# Patient Record
Sex: Female | Born: 1990 | Race: Black or African American | Hispanic: No | Marital: Single | Smoking: Never smoker
Health system: Southern US, Community
[De-identification: ages and names within clinical notes are randomized; demographics above are authoritative.]

## PROBLEM LIST (undated history)

## (undated) DIAGNOSIS — K900 Celiac disease: Secondary | ICD-10-CM

## (undated) DIAGNOSIS — F329 Major depressive disorder, single episode, unspecified: Secondary | ICD-10-CM

## (undated) DIAGNOSIS — L0591 Pilonidal cyst without abscess: Secondary | ICD-10-CM

## (undated) DIAGNOSIS — F32A Depression, unspecified: Secondary | ICD-10-CM

## (undated) DIAGNOSIS — E282 Polycystic ovarian syndrome: Secondary | ICD-10-CM

## (undated) DIAGNOSIS — D649 Anemia, unspecified: Secondary | ICD-10-CM

## (undated) DIAGNOSIS — F419 Anxiety disorder, unspecified: Secondary | ICD-10-CM

## (undated) DIAGNOSIS — Z8614 Personal history of Methicillin resistant Staphylococcus aureus infection: Secondary | ICD-10-CM

## (undated) DIAGNOSIS — G43909 Migraine, unspecified, not intractable, without status migrainosus: Secondary | ICD-10-CM

## (undated) HISTORY — DX: Polycystic ovarian syndrome: E28.2

## (undated) HISTORY — DX: Pilonidal cyst without abscess: L05.91

## (undated) HISTORY — DX: Anemia, unspecified: D64.9

## (undated) HISTORY — DX: Migraine, unspecified, not intractable, without status migrainosus: G43.909

## (undated) HISTORY — DX: Celiac disease: K90.0

## (undated) HISTORY — PX: INCISION AND DRAINAGE: SHX5863

---
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
## 2009-04-15 ENCOUNTER — Ambulatory Visit: Payer: Self-pay | Admitting: Family Medicine

## 2009-04-15 DIAGNOSIS — Z87898 Personal history of other specified conditions: Secondary | ICD-10-CM | POA: Insufficient documentation

## 2009-04-15 DIAGNOSIS — K625 Hemorrhage of anus and rectum: Secondary | ICD-10-CM | POA: Insufficient documentation

## 2009-04-15 DIAGNOSIS — J309 Allergic rhinitis, unspecified: Secondary | ICD-10-CM | POA: Insufficient documentation

## 2009-04-15 DIAGNOSIS — E282 Polycystic ovarian syndrome: Secondary | ICD-10-CM | POA: Insufficient documentation

## 2009-10-21 ENCOUNTER — Ambulatory Visit: Payer: Self-pay | Admitting: Family Medicine

## 2009-11-15 ENCOUNTER — Ambulatory Visit: Payer: Self-pay | Admitting: Family Medicine

## 2009-11-15 DIAGNOSIS — R51 Headache: Secondary | ICD-10-CM | POA: Insufficient documentation

## 2009-11-15 DIAGNOSIS — R519 Headache, unspecified: Secondary | ICD-10-CM | POA: Insufficient documentation

## 2009-11-26 ENCOUNTER — Ambulatory Visit: Payer: Self-pay | Admitting: Family Medicine

## 2010-07-02 ENCOUNTER — Ambulatory Visit: Payer: Self-pay | Admitting: Family Medicine

## 2010-07-02 DIAGNOSIS — M79609 Pain in unspecified limb: Secondary | ICD-10-CM | POA: Insufficient documentation

## 2010-07-02 DIAGNOSIS — L678 Other hair color and hair shaft abnormalities: Secondary | ICD-10-CM | POA: Insufficient documentation

## 2010-07-02 DIAGNOSIS — L738 Other specified follicular disorders: Secondary | ICD-10-CM | POA: Insufficient documentation

## 2010-07-04 LAB — CONVERTED CEMR LAB
BUN: 10 mg/dL (ref 6–23)
Basophils Relative: 0.9 % (ref 0.0–3.0)
CO2: 27 meq/L (ref 19–32)
Chloride: 109 meq/L (ref 96–112)
Creatinine, Ser: 0.8 mg/dL (ref 0.4–1.2)
Eosinophils Absolute: 0.1 10*3/uL (ref 0.0–0.7)
HCT: 34.8 % — ABNORMAL LOW (ref 36.0–46.0)
Lymphs Abs: 1.4 10*3/uL (ref 0.7–4.0)
MCHC: 32.4 g/dL (ref 30.0–36.0)
MCV: 76.1 fL — ABNORMAL LOW (ref 78.0–100.0)
Monocytes Absolute: 0.4 10*3/uL (ref 0.1–1.0)
Neutrophils Relative %: 43 % (ref 43.0–77.0)
Platelets: 401 10*3/uL — ABNORMAL HIGH (ref 150.0–400.0)
Potassium: 4.2 meq/L (ref 3.5–5.1)
Sed Rate: 10 mm/hr (ref 0–22)
TSH: 0.91 microintl units/mL (ref 0.35–5.50)

## 2010-08-15 ENCOUNTER — Encounter: Payer: Self-pay | Admitting: Family Medicine

## 2011-01-27 NOTE — Letter (Signed)
Summary: Immunization Form  Immunization Form   Imported By: Lanelle Bal 08/21/2010 08:24:17  _____________________________________________________________________  External Attachment:    Type:   Image     Comment:   External Document
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
## 2011-02-25 ENCOUNTER — Ambulatory Visit (INDEPENDENT_AMBULATORY_CARE_PROVIDER_SITE_OTHER): Payer: BC Managed Care – PPO | Admitting: Family Medicine

## 2011-02-25 ENCOUNTER — Encounter: Payer: Self-pay | Admitting: Family Medicine

## 2011-02-25 DIAGNOSIS — K625 Hemorrhage of anus and rectum: Secondary | ICD-10-CM
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
## 2012-08-02 ENCOUNTER — Encounter: Payer: Self-pay | Admitting: Family Medicine

## 2012-08-03 ENCOUNTER — Encounter: Payer: Self-pay | Admitting: *Deleted

## 2012-08-03 ENCOUNTER — Ambulatory Visit (INDEPENDENT_AMBULATORY_CARE_PROVIDER_SITE_OTHER): Payer: BC Managed Care – PPO | Admitting: Family Medicine

## 2012-08-03 ENCOUNTER — Encounter: Payer: Self-pay | Admitting: Family Medicine

## 2012-08-03 VITALS — BP 118/78 | HR 59 | Temp 98.5°F | Ht 67.25 in | Wt 248.8 lb

## 2012-08-03 DIAGNOSIS — R7309 Other abnormal glucose: Secondary | ICD-10-CM

## 2012-08-03 DIAGNOSIS — R5383 Other fatigue: Secondary | ICD-10-CM

## 2012-08-03 DIAGNOSIS — R5381 Other malaise: Secondary | ICD-10-CM

## 2012-08-03 LAB — CBC WITH DIFFERENTIAL/PLATELET
Basophils Relative: 0.5 % (ref 0.0–3.0)
Eosinophils Relative: 2.2 % (ref 0.0–5.0)
HCT: 38.9 % (ref 36.0–46.0)
Hemoglobin: 12.4 g/dL (ref 12.0–15.0)
Lymphs Abs: 1.4 10*3/uL (ref 0.7–4.0)
MCV: 79.7 fl (ref 78.0–100.0)
Monocytes Absolute: 0.5 10*3/uL (ref 0.1–1.0)
Monocytes Relative: 11.5 % (ref 3.0–12.0)
Neutro Abs: 2.1 10*3/uL (ref 1.4–7.7)
Platelets: 357 10*3/uL (ref 150.0–400.0)
WBC: 4 10*3/uL — ABNORMAL LOW (ref 4.5–10.5)

## 2012-08-03 LAB — GLUCOSE, POCT (MANUAL RESULT ENTRY): POC Glucose: 78 mg/dl (ref 70–99)

## 2012-08-03 LAB — TSH: TSH: 1.42 u[IU]/mL (ref 0.35–5.50)

## 2012-08-03 NOTE — Progress Notes (Signed)
   Nature conservation officer at [facility] [address] [place] [address] Phone: [phone] Fax: [phone]  Date:  08/03/2012   Name:  [name]   DOB:  1991-06-08   MRN:  [id number]  PCP:  Kerby Nora, MD    Chief Complaint: Blood Sugar Problem   History of Present Illness:  [name] is a 21 y.o. very pleasant female patient who presents with the following:  BS 78.  Eats breakfast, lunch, and dinner.  Feels a little sluggish, shaky, and some head hurting.  Has been happening around late afternoon.   Will eat at 9 AM, 1, 6-7 PM    Past Medical History, Surgical History, Social History, Family History, Problem List, Medications, and Allergies have been reviewed and updated if relevant.  No current outpatient prescriptions on file prior to visit.    Review of Systems:  GEN: No acute illnesses, no fevers, chills. GI: No n/v/d, eating normally Pulm: No SOB Interactive and getting along well at home.  Otherwise, ROS is as per the HPI.   Physical Examination: Filed Vitals:   08/03/12 0823  BP: 118/78  Pulse: 59  Temp: 98.5 F (36.9 C)   Filed Vitals:   08/03/12 0823  Height: 5' 7.25" (1.708 m)  Weight: 248 lb 12.8 oz (112.855 kg)   Body mass index is 38.68 kg/(m^2). Ideal Body Weight: Weight in (lb) to have BMI = 25: 160.5    GEN: WDWN, NAD, Non-toxic, Alert & Oriented x 3 HEENT: Atraumatic, Normocephalic.  Ears and Nose: No external deformity. EXTR: No clubbing/cyanosis/edema NEURO: Normal gait.  PSYCH: Normally interactive. Conversant. Not depressed or anxious appearing.  Calm demeanor.    Assessment and Plan:  1. Abnormal blood sugar  Glucose (CBG)  2. Fatigue  CBC with Differential, TSH   Sounds to be classic hypoglycemia, reviewed diet, how to alter, schedule out during day  Check basic cbc and tsh, too  Orders Today:  Orders Placed This Encounter  Procedures  . CBC with Differential  . TSH  . Glucose (CBG)     Medications Today: (Includes new updates added during medication reconciliation) No orders of the defined types were placed in this encounter.     Hannah Beat, MD

## 2013-05-19 ENCOUNTER — Encounter: Payer: Self-pay | Admitting: Family Medicine

## 2013-05-19 ENCOUNTER — Ambulatory Visit (INDEPENDENT_AMBULATORY_CARE_PROVIDER_SITE_OTHER): Payer: BC Managed Care – PPO | Admitting: Family Medicine

## 2013-05-19 VITALS — BP 100/70 | HR 68 | Temp 98.2°F | Wt 251.0 lb

## 2013-05-19 DIAGNOSIS — R42 Dizziness and giddiness: Secondary | ICD-10-CM

## 2013-05-19 LAB — CBC WITH DIFFERENTIAL/PLATELET
Basophils Relative: 0.3 % (ref 0.0–3.0)
Eosinophils Absolute: 0.2 10*3/uL (ref 0.0–0.7)
Hemoglobin: 12.5 g/dL (ref 12.0–15.0)
Lymphocytes Relative: 31.1 % (ref 12.0–46.0)
MCHC: 32.5 g/dL (ref 30.0–36.0)
Monocytes Relative: 8 % (ref 3.0–12.0)
Neutro Abs: 3.6 10*3/uL (ref 1.4–7.7)
Neutrophils Relative %: 57.8 % (ref 43.0–77.0)
RBC: 4.9 Mil/uL (ref 3.87–5.11)
WBC: 6.2 10*3/uL (ref 4.5–10.5)

## 2013-05-19 LAB — COMPREHENSIVE METABOLIC PANEL
AST: 17 U/L (ref 0–37)
Albumin: 3.3 g/dL — ABNORMAL LOW (ref 3.5–5.2)
BUN: 7 mg/dL (ref 6–23)
CO2: 24 mEq/L (ref 19–32)
Calcium: 8.6 mg/dL (ref 8.4–10.5)
Chloride: 109 mEq/L (ref 96–112)
Creatinine, Ser: 0.8 mg/dL (ref 0.4–1.2)
GFR: 114.04 mL/min (ref >60.00)
Glucose, Bld: 82 mg/dL (ref 70–99)
Potassium: 4.1 mEq/L (ref 3.5–5.1)

## 2013-05-19 NOTE — Assessment & Plan Note (Signed)
Most likely vasovagal.. Will eval with labs to rule out anemia, electrolyte issues, diabetes. No clear dehydration  Pt denies pregnancy.

## 2013-05-19 NOTE — Progress Notes (Signed)
  Subjective:    Patient ID: [name], female    DOB: [birth year], 22 y.o.   MRN: [id number]  HPI  22 year old female with history of migraines present following presyncopal episode yesterday   Went to bathroom. Occurred after she changed tampon and urinated. Started feeling nauseous, noted dizziness, lightheaded, sweating...became presyncopal but never fell or hit head . Was dizzy for 15 minutes. She was disoriented following this.   She had been feeling well prior to this spell. She is on her menses... Started 2 days ago, using 2 pads per day. Never heavy.  Has been somewhat irregular lately,  No sex since since October.   No CP, no SOB, no headahce prior but has had one since.  Had eaten regularly that day. No increase in stress, no depression or anxiety.  Has not had a migraine in  Months.  Review of Systems  Constitutional: Negative for fever and fatigue.  HENT: Negative for ear pain.   Eyes: Negative for pain.  Respiratory: Negative for chest tightness and shortness of breath.   Cardiovascular: Negative for chest pain, palpitations and leg swelling.  Gastrointestinal: Negative for abdominal pain.  Genitourinary: Negative for dysuria.       Objective:   Physical Exam  Constitutional: Vital signs are normal. She appears well-developed and well-nourished. She is cooperative.  Non-toxic appearance. She does not appear ill. No distress.  HENT:  Head: Normocephalic.  Right Ear: Hearing, tympanic membrane, external ear and ear canal normal. Tympanic membrane is not erythematous, not retracted and not bulging.  Left Ear: Hearing, tympanic membrane, external ear and ear canal normal. Tympanic membrane is not erythematous, not retracted and not bulging.  Nose: No mucosal edema or rhinorrhea. Right sinus exhibits no maxillary sinus tenderness and no frontal sinus tenderness. Left sinus exhibits no maxillary sinus tenderness and no frontal sinus tenderness.  Mouth/Throat: Uvula is  midline, oropharynx is clear and moist and mucous membranes are normal.  Eyes: Conjunctivae, EOM and lids are normal. Pupils are equal, round, and reactive to light. No foreign bodies found.  Fundoscopic exam:      The right eye shows no papilledema.  Neck: Trachea normal and normal range of motion. Neck supple. Carotid bruit is not present. No mass and no thyromegaly present.  Cardiovascular: Normal rate, regular rhythm, S1 normal, S2 normal, normal heart sounds, intact distal pulses and normal pulses.  Exam reveals no gallop and no friction rub.   No murmur heard. Pulmonary/Chest: Effort normal and breath sounds normal. Not tachypneic. No respiratory distress. She has no decreased breath sounds. She has no wheezes. She has no rhonchi. She has no rales.  Abdominal: Soft. Normal appearance and bowel sounds are normal. There is no tenderness.  Neurological: She is alert. She has normal strength and normal reflexes. No cranial nerve deficit or sensory deficit. She displays a negative Romberg sign. Gait normal. GCS eye subscore is 4. GCS verbal subscore is 5. GCS motor subscore is 6.  Skin: Skin is warm, dry and intact. No rash noted.  Psychiatric: Her speech is normal and behavior is normal. Judgment and thought content normal. Her mood appears not anxious. Cognition and memory are normal. She does not exhibit a depressed mood.          Assessment & Plan:

## 2013-05-19 NOTE — Patient Instructions (Addendum)
Push fluids.  We will call with lab results.

## 2013-06-16 ENCOUNTER — Other Ambulatory Visit (HOSPITAL_COMMUNITY)
Admission: RE | Admit: 2013-06-16 | Discharge: 2013-06-16 | Disposition: A | Discharge status: Home / Self Care | Payer: BC Managed Care – PPO | Source: Ambulatory Visit | Attending: Family Medicine | Admitting: Family Medicine

## 2013-06-16 ENCOUNTER — Ambulatory Visit (INDEPENDENT_AMBULATORY_CARE_PROVIDER_SITE_OTHER): Payer: BC Managed Care – PPO | Admitting: Family Medicine

## 2013-06-16 ENCOUNTER — Encounter: Payer: Self-pay | Admitting: Family Medicine

## 2013-06-16 VITALS — BP 128/64 | HR 82 | Temp 98.5°F | Ht 67.25 in | Wt 249.5 lb

## 2013-06-16 DIAGNOSIS — Z309 Encounter for contraceptive management, unspecified: Secondary | ICD-10-CM

## 2013-06-16 DIAGNOSIS — Z01419 Encounter for gynecological examination (general) (routine) without abnormal findings: Secondary | ICD-10-CM | POA: Insufficient documentation

## 2013-06-16 DIAGNOSIS — Z124 Encounter for screening for malignant neoplasm of cervix: Secondary | ICD-10-CM

## 2013-06-16 DIAGNOSIS — Z113 Encounter for screening for infections with a predominantly sexual mode of transmission: Secondary | ICD-10-CM | POA: Insufficient documentation

## 2013-06-16 DIAGNOSIS — Z13220 Encounter for screening for lipoid disorders: Secondary | ICD-10-CM

## 2013-06-16 DIAGNOSIS — Z3009 Encounter for other general counseling and advice on contraception: Secondary | ICD-10-CM | POA: Insufficient documentation

## 2013-06-16 DIAGNOSIS — Z0000 Encounter for general adult medical examination without abnormal findings: Secondary | ICD-10-CM

## 2013-06-16 LAB — LIPID PANEL
HDL: 58.6 mg/dL (ref >39.00)
Total CHOL/HDL Ratio: 3
Triglycerides: 86 mg/dL (ref 0.0–149.0)
VLDL: 17.2 mg/dL (ref 0.0–40.0)

## 2013-06-16 MED ORDER — NORGESTIMATE-ETH ESTRADIOL 0.25-35 MG-MCG PO TABS: 1.0000 | ORAL_TABLET | Freq: Every day | ORAL | Status: DC

## 2013-06-16 NOTE — Assessment & Plan Note (Signed)
Discussed options.  She chose [facility].  Start sprintec.

## 2013-06-16 NOTE — Patient Instructions (Addendum)
Work on [employer], healthy eating and weight loss.  Stop at lab on your way.
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
## 2013-06-17 LAB — HIV ANTIBODY (ROUTINE TESTING W REFLEX): HIV: REACTIVE

## 2013-06-19 ENCOUNTER — Encounter: Payer: Self-pay | Admitting: *Deleted

## 2013-06-19 LAB — HEPATITIS PANEL, ACUTE
HCV Ab: NEGATIVE
Hep B C IgM: NEGATIVE
Hepatitis B Surface Ag: NEGATIVE

## 2013-06-24 LAB — HIV-1 RNA, QUALITATIVE, TMA: HIV-1 RNA, Qualitative, TMA: NOT DETECTED

## 2013-07-31 ENCOUNTER — Other Ambulatory Visit: Payer: Self-pay | Admitting: *Deleted

## 2013-07-31 MED ORDER — NORGESTIMATE-ETH ESTRADIOL 0.25-35 MG-MCG PO TABS: 1.0000 | ORAL_TABLET | Freq: Every day | ORAL | Status: DC

## 2013-11-02 ENCOUNTER — Other Ambulatory Visit: Payer: Self-pay

## 2014-06-26 ENCOUNTER — Ambulatory Visit (INDEPENDENT_AMBULATORY_CARE_PROVIDER_SITE_OTHER): Payer: BC Managed Care – PPO | Admitting: Family Medicine

## 2014-06-26 ENCOUNTER — Encounter: Payer: Self-pay | Admitting: Family Medicine

## 2014-06-26 VITALS — BP 110/70 | HR 73 | Temp 98.2°F | Ht 67.25 in | Wt 251.2 lb

## 2014-06-26 DIAGNOSIS — E669 Obesity, unspecified: Secondary | ICD-10-CM

## 2014-06-26 DIAGNOSIS — E162 Hypoglycemia, unspecified: Secondary | ICD-10-CM

## 2014-06-26 DIAGNOSIS — R454 Irritability and anger: Secondary | ICD-10-CM | POA: Insufficient documentation

## 2014-06-26 NOTE — Assessment & Plan Note (Signed)
Encouraged to not skip meals. Increase protein and fiber in each meal.

## 2014-06-26 NOTE — Assessment & Plan Note (Signed)
Total visit time 30 minutes, > 50% spent counseling and cordinating patients care. Reviewed diet and recommended changes to increase exercise, eat 3 meals a day with snacks.  Low carb diet but high protein and fiber in each meals. Continue water. Follow up in 1-2 months.

## 2014-06-26 NOTE — Progress Notes (Signed)
Pre visit review using our clinic review tool, if applicable. No additional management support is needed unless otherwise documented below in the visit note. 

## 2014-06-26 NOTE — Progress Notes (Signed)
   Subjective:    Patient ID: [name], female    DOB: 02-Nov-1991, 23 y.o.   MRN: [id number]  HPI  23 year old female presents for new onset mood instability, easy to anger. She is tearful frequently when angry. She is irritable. She gets higher level of anger then she has ever had in past.  Ongoing in last year.  Less motivation.  Has some anhedonia, doesn't write anymore. Denies depression, she is anxious. No issues with insomnia. She has been sleeping later.  No excessive guilt.  No SI, no HI. Interfering at home and work.  No increase in stress, no changes in life. No changes associated with menses. No new meds. No history of mood issues.  She has had trouble losing weight . He weight has been stable in alst few years. She knows she needs to change diet.  She does not have time to work out, but try to be more active in daily routine.  Breakfast: Tuna, mayo Wrap tomato and cheese, water Snack watermelon, water  Dinner: [place] bake (rice, beans cheese) water No bedtime snack.   She frequently skips one meal a day.       Review of Systems  Constitutional: Negative for fever and fatigue.  HENT: Negative for ear pain.   Eyes: Negative for pain.  Respiratory: Negative for chest tightness and shortness of breath.   Cardiovascular: Negative for chest pain, palpitations and leg swelling.  Gastrointestinal: Negative for abdominal pain.  Genitourinary: Negative for dysuria.       Objective:   Physical Exam  Constitutional: Vital signs are normal. She appears well-developed and well-nourished. She is cooperative.  Non-toxic appearance. She does not appear ill. No distress.  Obese   HENT:  Head: Normocephalic.  Right Ear: Hearing, tympanic membrane, external ear and ear canal normal. Tympanic membrane is not erythematous, not retracted and not bulging.  Left Ear: Hearing, tympanic membrane, external ear and ear canal normal. Tympanic membrane is not erythematous,  not retracted and not bulging.  Nose: No mucosal edema or rhinorrhea. Right sinus exhibits no maxillary sinus tenderness and no frontal sinus tenderness. Left sinus exhibits no maxillary sinus tenderness and no frontal sinus tenderness.  Mouth/Throat: Uvula is midline, oropharynx is clear and moist and mucous membranes are normal.  Eyes: Conjunctivae, EOM and lids are normal. Pupils are equal, round, and reactive to light. Lids are everted and swept, no foreign bodies found.  Neck: Trachea normal and normal range of motion. Neck supple. Carotid bruit is not present. No mass and no thyromegaly present.  Cardiovascular: Normal rate, regular rhythm, S1 normal, S2 normal, normal heart sounds, intact distal pulses and normal pulses.  Exam reveals no gallop and no friction rub.   No murmur heard. Pulmonary/Chest: Effort normal and breath sounds normal. Not tachypneic. No respiratory distress. She has no decreased breath sounds. She has no wheezes. She has no rhonchi. She has no rales.  Abdominal: Soft. Normal appearance and bowel sounds are normal. There is no tenderness.  Neurological: She is alert.  Skin: Skin is warm, dry and intact. No rash noted.  Psychiatric: Her speech is normal and behavior is normal. Judgment and thought content normal. Her mood appears not anxious. Cognition and memory are normal. She does not exhibit a depressed mood.          Assessment & Plan:

## 2014-06-26 NOTE — Assessment & Plan Note (Signed)
NO definate major depression.  Will refer for anger management and counseling.  If not improving in follow up 1 month, consider venlafaxine to treat.

## 2014-06-26 NOTE — Patient Instructions (Addendum)
Work on increasing exercise to gradually to 3-5 times a week. Get partner to exercise and work on diet changes with you.  Do not skip meals.  Increase fiber and protein in each meal.  Work on low carb.  Water is great.  Stop at front desk to set up nutritionist and counselor.  Follow up in 1-2 months for weight and mood.

## 2014-07-18 ENCOUNTER — Ambulatory Visit: Payer: BC Managed Care – PPO | Admitting: Psychology

## 2014-08-01 ENCOUNTER — Ambulatory Visit (INDEPENDENT_AMBULATORY_CARE_PROVIDER_SITE_OTHER): Payer: BC Managed Care – PPO | Admitting: Psychology

## 2014-08-01 DIAGNOSIS — F331 Major depressive disorder, recurrent, moderate: Secondary | ICD-10-CM

## 2014-08-07 ENCOUNTER — Encounter: Payer: BC Managed Care – PPO | Admitting: Family Medicine

## 2014-08-07 DIAGNOSIS — Z0289 Encounter for other administrative examinations: Secondary | ICD-10-CM

## 2014-08-15 ENCOUNTER — Ambulatory Visit: Payer: BC Managed Care – PPO | Admitting: Psychology

## 2014-08-29 ENCOUNTER — Ambulatory Visit (INDEPENDENT_AMBULATORY_CARE_PROVIDER_SITE_OTHER): Payer: BC Managed Care – PPO | Admitting: Psychology

## 2014-08-29 DIAGNOSIS — F331 Major depressive disorder, recurrent, moderate: Secondary | ICD-10-CM

## 2014-09-05 ENCOUNTER — Ambulatory Visit: Payer: BC Managed Care – PPO | Admitting: Psychology

## 2015-04-04 ENCOUNTER — Encounter: Payer: Self-pay | Admitting: Family Medicine

## 2015-04-04 ENCOUNTER — Other Ambulatory Visit (HOSPITAL_COMMUNITY)
Admission: RE | Admit: 2015-04-04 | Discharge: 2015-04-04 | Disposition: A | Discharge status: Home / Self Care | Payer: BLUE CROSS/BLUE SHIELD | Source: Ambulatory Visit | Attending: Family Medicine | Admitting: Family Medicine

## 2015-04-04 ENCOUNTER — Ambulatory Visit (INDEPENDENT_AMBULATORY_CARE_PROVIDER_SITE_OTHER): Payer: BLUE CROSS/BLUE SHIELD | Admitting: Family Medicine

## 2015-04-04 VITALS — BP 122/70 | HR 93 | Temp 98.4°F | Ht 67.0 in | Wt 262.0 lb

## 2015-04-04 DIAGNOSIS — Z113 Encounter for screening for infections with a predominantly sexual mode of transmission: Secondary | ICD-10-CM

## 2015-04-04 DIAGNOSIS — Z1151 Encounter for screening for human papillomavirus (HPV): Secondary | ICD-10-CM | POA: Insufficient documentation

## 2015-04-04 DIAGNOSIS — Z01419 Encounter for gynecological examination (general) (routine) without abnormal findings: Secondary | ICD-10-CM | POA: Insufficient documentation

## 2015-04-04 DIAGNOSIS — E669 Obesity, unspecified: Secondary | ICD-10-CM

## 2015-04-04 DIAGNOSIS — Z30018 Encounter for initial prescription of other contraceptives: Secondary | ICD-10-CM

## 2015-04-04 DIAGNOSIS — Z13220 Encounter for screening for lipoid disorders: Secondary | ICD-10-CM

## 2015-04-04 DIAGNOSIS — Z0000 Encounter for general adult medical examination without abnormal findings: Secondary | ICD-10-CM | POA: Diagnosis not present

## 2015-04-04 DIAGNOSIS — Z124 Encounter for screening for malignant neoplasm of cervix: Secondary | ICD-10-CM

## 2015-04-04 MED ORDER — NORELGESTROMIN-ETH ESTRADIOL 150-35 MCG/24HR TD PTWK: 1.0000 | MEDICATED_PATCH | TRANSDERMAL | Status: DC

## 2015-04-04 NOTE — Patient Instructions (Addendum)
Schedule lab appt when able for fasting labs. Working on regular exercise. Continue working on [employer], increasing water, decreasing high fat , carb foods. Work on weight loss as able.

## 2015-04-04 NOTE — Progress Notes (Signed)
Pre visit review using our clinic review tool, if applicable. No additional management support is needed unless otherwise documented below in the visit note. 

## 2015-04-04 NOTE — Progress Notes (Signed)
The patient is here for annual wellness exam and preventative care.   She is doing well overall.  Mood doing well overall. Went to Social worker.  Having panic attacks rarely.   Working third shift, sleepy today.  Menses regular, no dysmenorrhea. Last 2-3 days LMP 03/25/15. Occ current sexual activity, using condoms when active. No current migraines.  Exercise: active ob but not exercise. Diet: poor , working on improving.  sHistory   Social History  . Marital Status: Married    Spouse Name: N/A    Number of Children: N/A  . Years of Education: N/A   Occupational History  . STUDENT    Social History Main Topics  . Smoking status: Never Smoker   . Smokeless tobacco: None  . Alcohol Use: Yes     Comment: OCC  . Drug Use: No  . Sexually Active: Not Currently   Other Topics Concern  . None   Social History Narrative   REGULAR EXERCISE-NO   Healthy eating.   HAS EXPERIENCED PHYSICAL ABUSE   SLEEPS 7-9 HOURS PER NIGHT   5 PEOPLE LIVING IN THE RESIDENCE     Review of Systems  Constitutional: Negative for fever, fatigue and unexpected weight change.  HENT:  Negative for ear pain, sore throat, sneezing, trouble swallowing and sinus pressure.  Eyes: Negative for pain and itching.  Respiratory: Negative for cough, shortness of breath and wheezing.  Cardiovascular: Negative for chest pain, palpitations and leg swelling.  Gastrointestinal: Negative for nausea, abdominal pain, diarrhea, constipation and blood in stool.  Genitourinary: Negative for dysuria, hematuria, vaginal discharge, difficulty urinating and menstrual problem.  Skin: Negative for rash.  Neurological: Negative for syncope, weakness, light-headedness, numbness and headaches.  Psychiatric/Behavioral: Negative for confusion and dysphoric mood. The patient is not nervous/anxious.       Objective:   Physical Exam  Constitutional: Vital  signs are normal. She appears well-developed and well-nourished. She is cooperative. Non-toxic appearance. She does not appear ill. No distress.  obese  HENT:  Head: Normocephalic.  Right Ear: Hearing, tympanic membrane, external ear and ear canal normal.  Left Ear: Hearing, tympanic membrane, external ear and ear canal normal.  Nose: Nose normal.  Eyes: Conjunctivae, EOM and lids are normal. Pupils are equal, round, and reactive to light. No foreign bodies found.  Neck: Trachea normal and normal range of motion. Neck supple. Carotid bruit is not present. No mass and no thyromegaly present.  Cardiovascular: Normal rate, regular rhythm, S1 normal, S2 normal, normal heart sounds and intact distal pulses. Exam reveals no gallop.  No murmur heard. Pulmonary/Chest: Effort normal and breath sounds normal. No respiratory distress. She has no wheezes. She has no rhonchi. She has no rales.  Abdominal: Soft. Normal appearance and bowel sounds are normal. She exhibits no distension, no fluid wave, no abdominal bruit and no mass. There is no hepatosplenomegaly. There is no tenderness. There is no rebound, no guarding and no CVA tenderness. No hernia.  Genitourinary: Vagina normal and uterus normal. No breast swelling, tenderness, discharge or bleeding. Pelvic exam was performed with patient prone. There is no rash, tenderness or lesion on the right labia. There is no rash, tenderness or lesion on the left labia. Uterus is not enlarged and not tender. Cervix exhibits no motion tenderness, no discharge and no friability. Right adnexum displays no mass, no tenderness and no fullness. Left adnexum displays no mass, no tenderness and no fullness.  Lymphadenopathy:   She has no cervical adenopathy.   She has  no axillary adenopathy.  Neurological: She is alert. She has normal strength. No cranial nerve deficit or sensory deficit.  Skin: Skin is warm, dry and intact. No rash noted.  Psychiatric: Her speech is  normal and behavior is normal. Judgment normal. Her mood appears not anxious. Cognition and memory are normal. She does not exhibit a depressed mood.          Assessment & Plan:  The patient's preventative maintenance and recommended screening tests for an annual wellness exam were reviewed in full today. Brought up to date unless services declined.  Counselled on the importance of diet, exercise, and its role in overall health and mortality. The patient's FH and SH was reviewed, including their home life, tobacco status, and drug and alcohol status.   Vaccines: uptodate. Non smoker PAP/DVE:last pap 2014 nml, due this year with DVE then space every 3 years. STD screening:will do.

## 2015-04-08 ENCOUNTER — Other Ambulatory Visit: Payer: Self-pay | Admitting: Family Medicine

## 2015-04-08 ENCOUNTER — Other Ambulatory Visit (INDEPENDENT_AMBULATORY_CARE_PROVIDER_SITE_OTHER): Payer: BLUE CROSS/BLUE SHIELD

## 2015-04-08 DIAGNOSIS — E669 Obesity, unspecified: Secondary | ICD-10-CM

## 2015-04-08 DIAGNOSIS — E282 Polycystic ovarian syndrome: Secondary | ICD-10-CM

## 2015-04-08 LAB — CYTOLOGY - PAP

## 2015-04-08 NOTE — Addendum Note (Signed)
Addended by: Marchia Bond on: 04/08/2015 03:42 PM   Modules accepted: Orders

## 2015-04-09 ENCOUNTER — Encounter: Payer: Self-pay | Admitting: *Deleted

## 2015-04-09 LAB — COMPREHENSIVE METABOLIC PANEL
ALK PHOS: 96 IU/L (ref 39–117)
ALT: 14 IU/L (ref 0–32)
AST: 17 IU/L (ref 0–40)
Albumin/Globulin Ratio: 1.5 (ref 1.1–2.5)
Albumin: 4.3 g/dL (ref 3.5–5.5)
BILIRUBIN TOTAL: 0.3 mg/dL (ref 0.0–1.2)
BUN / CREAT RATIO: 11 (ref 8–20)
BUN: 9 mg/dL (ref 6–20)
CHLORIDE: 102 mmol/L (ref 97–108)
CO2: 25 mmol/L (ref 18–29)
CREATININE: 0.81 mg/dL (ref 0.57–1.00)
Calcium: 9.4 mg/dL (ref 8.7–10.2)
GFR calc Af Amer: 117 mL/min/{1.73_m2} (ref >59)
GFR, EST NON AFRICAN AMERICAN: 101 mL/min/{1.73_m2} (ref >59)
Globulin, Total: 2.9 g/dL (ref 1.5–4.5)
Glucose: 78 mg/dL (ref 65–99)
Potassium: 4.3 mmol/L (ref 3.5–5.2)
Sodium: 141 mmol/L (ref 134–144)
Total Protein: 7.2 g/dL (ref 6.0–8.5)

## 2015-04-09 LAB — LIPID PANEL
CHOL/HDL RATIO: 3.1 ratio (ref 0.0–4.4)
Cholesterol, Total: 180 mg/dL (ref 100–199)
HDL: 59 mg/dL (ref >39)
LDL Calculated: 106 mg/dL — ABNORMAL HIGH (ref 0–99)
TRIGLYCERIDES: 76 mg/dL (ref 0–149)
VLDL CHOLESTEROL CAL: 15 mg/dL (ref 5–40)

## 2015-04-09 LAB — TSH: TSH: 0.72 u[IU]/mL (ref 0.450–4.500)

## 2015-07-16 ENCOUNTER — Encounter: Payer: Self-pay | Admitting: Internal Medicine

## 2015-07-16 ENCOUNTER — Ambulatory Visit (INDEPENDENT_AMBULATORY_CARE_PROVIDER_SITE_OTHER): Payer: BLUE CROSS/BLUE SHIELD | Admitting: Internal Medicine

## 2015-07-16 VITALS — BP 120/80 | HR 77 | Wt 261.0 lb

## 2015-07-16 DIAGNOSIS — S29012A Strain of muscle and tendon of back wall of thorax, initial encounter: Secondary | ICD-10-CM | POA: Diagnosis not present

## 2015-07-16 DIAGNOSIS — S29019A Strain of muscle and tendon of unspecified wall of thorax, initial encounter: Secondary | ICD-10-CM

## 2015-07-16 NOTE — Assessment & Plan Note (Signed)
Seems to be positional relating to how she holds plates to streak them Discussed changing position--sitting/standing, holding plate on table instead of in air when streaking, etc Discussed back strengthening Ibuprofen/tylenol prn

## 2015-07-16 NOTE — Patient Instructions (Signed)
Please look into core strengthening and upper back strengthening.

## 2015-07-16 NOTE — Progress Notes (Signed)
   Subjective:    Patient ID: Nyhla Johnson, female    DOB: 10-06-1991, 24 y.o.   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 norelgestromin-ethinyl estradiol (ORTHO EVRA) 150-35 MCG/24HR transdermal patch Place 1 patch onto the skin once a week. 3 patch 12   No current facility-administered medications on file prior to visit.    No Known Allergies  Past Medical History  Diagnosis Date  . Migraine   . Polycystic ovaries     No past surgical history on file.  Family History  Problem Relation Age of Onset  . Leukemia Mother 22  . Hypertension Father     History   Social History  . Marital Status: Single    Spouse Name: N/A  . Number of Children: 0  . Years of Education: N/A   Occupational History  . Microbiology lab [place]   Social History Main Topics  . Smoking status: Never Smoker   . Smokeless tobacco: Never Used  . Alcohol Use: Yes     Comment: OCC  . Drug Use: No  . Sexual Activity: Not Currently   Other Topics Concern  . Not on file   Social History Narrative   REGULAR EXERCISE-NO   Healthy eating.   HAS EXPERIENCED PHYSICAL ABUSE   SLEEPS 7-9 HOURS PER NIGHT   5 PEOPLE LIVING IN THE RESIDENCE    Review of Systems  No arm or leg weakness Mild increased urinary frequency but no dysuria No trouble with bowels Some stomach  upset     Objective:   Physical Exam  Constitutional: She appears well-developed and well-nourished. No distress.  Neck: Normal range of motion. Neck supple.  Musculoskeletal:  No sig spine tenderness Mostly some tenderness along left lateral back in low thoracic area Normal ROM of arms  Neurological:  No arm weakness          Assessment & Plan:

## 2015-07-16 NOTE — Progress Notes (Signed)
Pre visit review using our clinic review tool, if applicable. No additional management support is needed unless otherwise documented below in the visit note. 

## 2016-03-21 ENCOUNTER — Encounter: Payer: Self-pay | Admitting: Emergency Medicine

## 2016-03-21 ENCOUNTER — Emergency Department
Admission: EM | Admit: 2016-03-21 | Discharge: 2016-03-21 | Disposition: A | Discharge status: Home / Self Care | Payer: 59 | Attending: Emergency Medicine | Admitting: Emergency Medicine

## 2016-03-21 DIAGNOSIS — G43909 Migraine, unspecified, not intractable, without status migrainosus: Secondary | ICD-10-CM | POA: Insufficient documentation

## 2016-03-21 DIAGNOSIS — J329 Chronic sinusitis, unspecified: Secondary | ICD-10-CM

## 2016-03-21 DIAGNOSIS — J111 Influenza due to unidentified influenza virus with other respiratory manifestations: Secondary | ICD-10-CM | POA: Insufficient documentation

## 2016-03-21 DIAGNOSIS — R509 Fever, unspecified: Secondary | ICD-10-CM | POA: Diagnosis present

## 2016-03-21 DIAGNOSIS — E162 Hypoglycemia, unspecified: Secondary | ICD-10-CM | POA: Diagnosis not present

## 2016-03-21 DIAGNOSIS — B9789 Other viral agents as the cause of diseases classified elsewhere: Secondary | ICD-10-CM

## 2016-03-21 LAB — POCT RAPID STREP A: Streptococcus, Group A Screen (Direct): NEGATIVE

## 2016-03-21 LAB — RAPID INFLUENZA A&B ANTIGENS
Influenza A (ARMC): POSITIVE — AB
Influenza B (ARMC): NEGATIVE

## 2016-03-21 MED ORDER — OSELTAMIVIR PHOSPHATE 75 MG PO CAPS: 75.0000 mg | ORAL_CAPSULE | Freq: Two times a day (BID) | ORAL | Status: DC

## 2016-03-21 MED ORDER — FLUTICASONE PROPIONATE 50 MCG/ACT NA SUSP: 2.0000 | Freq: Every day | NASAL | Status: DC

## 2016-03-21 MED ORDER — PSEUDOEPH-BROMPHEN-DM 30-2-10 MG/5ML PO SYRP: 10.0000 mL | ORAL_SOLUTION | Freq: Four times a day (QID) | ORAL | Status: DC | PRN

## 2016-03-21 NOTE — ED Notes (Signed)
Patietn states took ibuprofen prior to arrival, states was sent here because they did not have flu swabs.

## 2016-03-21 NOTE — ED Notes (Signed)
Cough yesterday, fever today.

## 2016-03-21 NOTE — ED Notes (Signed)
Discussed discharge instructions, prescriptions, and follow-up care with patient. No questions or concerns at this time. Pt stable at discharge.  

## 2016-03-21 NOTE — ED Provider Notes (Signed)
[facility] Emergency Department Provider Note  ____________________________________________  Time seen: Approximately 3:12 PM  I have reviewed the triage vital signs and the nursing notes.   HISTORY  Chief Complaint Fever    HPI Masiyah Hilpert is a 25 y.o. female , NAD, presents to the emergency department with 2 day history of sudden onset cough, fever, sinus pressure. States she was seen in a local urgent care earlier today who gave her ibuprofen and told her she had viral respiratory infection. Strep test was completed and was negative but they did not have flu tests and are test patient for flu. Patient notes that she spoke with her stepmother and she advised her to come to this emergency department to have flu testing completed. The patient has had onset of generalized body aches and fever as of yesterday. Has had frontal sinus pressure for 2 days. Has not taken anything over-the-counter to decrease her symptoms. Denies headaches, chest pain, back pain, abdominal pain, nausea, vomiting, diarrhea.   Past Medical History  Diagnosis Date  . Migraine   . Polycystic ovaries     Patient Active Problem List   Diagnosis Date Noted  . Strain of thoracic region 07/16/2015  . Hypoglycemia 06/26/2014  . Irritability and anger 06/26/2014  . Obese 06/26/2014  . Contraceptive management 06/16/2013  . NONSPEC REACT TUBERCULIN SKN TEST W/O ACTV TB 10/21/2009  . POLYCYSTIC OVARIAN DISEASE 04/15/2009  . ALLERGIC RHINITIS 04/15/2009  . MIGRAINES, HX OF 04/15/2009    History reviewed. No pertinent past surgical history.  Current Outpatient Rx  Name  Route  Sig  Dispense  Refill  . brompheniramine-pseudoephedrine-DM 30-2-10 MG/5ML syrup   Oral   Take 10 mLs by mouth 4 (four) times daily as needed.   200 mL   0   . fluticasone (FLONASE) 50 MCG/ACT nasal spray   Each Nare   Place 2 sprays into both nostrils daily.   16 g   0   . norelgestromin-ethinyl  estradiol (ORTHO EVRA) 150-35 MCG/24HR transdermal patch   Transdermal   Place 1 patch onto the skin once a week.   3 patch   12   . oseltamivir (TAMIFLU) 75 MG capsule   Oral   Take 1 capsule (75 mg total) by mouth 2 (two) times daily.   10 capsule   0     Allergies Review of patient's allergies indicates no known allergies.  Family History  Problem Relation Age of Onset  . Leukemia Mother 57  . Hypertension Father     Social History Social History  Substance Use Topics  . Smoking status: Never Smoker   . Smokeless tobacco: Never Used  . Alcohol Use: Yes     Comment: OCC     Review of Systems  Constitutional: Positive fever, chills, sweats. Eyes: No visual changes. No discharge, erythema, swelling ENT: Positive nasal congestion, frontal sinus pressure, sore throat. No ear pain. Cardiovascular: No chest pain. Respiratory: Positive dry cough. No shortness of breath. No wheezing.  Gastrointestinal: No abdominal pain.  No nausea, vomiting.  No diarrhea.  Musculoskeletal: Positive general myalgias. Negative for back, neck pain.  Skin: Negative for rash. Neurological: Negative for headaches, focal weakness or numbness. 10-point ROS otherwise negative.  ____________________________________________   PHYSICAL EXAM:  VITAL SIGNS: ED Triage Vitals  Enc Vitals Group     BP 03/21/16 1411 128/71 mmHg     Pulse Rate 03/21/16 1411 120     Resp 03/21/16 1411 20  Temp 03/21/16 1411 99.9 F (37.7 C)     Temp Source 03/21/16 1411 Oral     SpO2 03/21/16 1411 98 %     Weight 03/21/16 1411 276 lb (125.193 kg)     Height 03/21/16 1411 5\' 7"  (1.702 m)     Head Cir --      Peak Flow --      Pain Score 03/21/16 1413 5     Pain Loc --      Pain Edu? --      Excl. in [place]? --     Constitutional: Alert and oriented. Well appearing and in no acute distress. Eyes: Conjunctivae are normal. PERRL. EOMI without pain.  Head: Atraumatic. ENT:      Ears: TMs visualized  bilaterally with mild bulging but no effusion, erythema, perforation.      Nose: Mild congestion with trace clear rhinnorhea.      Mouth/Throat: Mucous membranes are moist. Pharynx with mild injection but no swelling or exudates. Neck: Supple with full range of motion. Hematological/Lymphatic/Immunilogical: No cervical lymphadenopathy. Cardiovascular: Normal rate, regular rhythm. Normal S1 and S2. No murmurs rubs or gallops. Good peripheral circulation. Respiratory: Normal respiratory effort without tachypnea or retractions. Lungs CTAB with breath sounds noted throughout. Neurologic:  Normal speech and language. No gross focal neurologic deficits are appreciated.  Skin:  Skin is warm, dry and intact. No rash noted. Psychiatric: Mood and affect are normal. Speech and behavior are normal. Patient exhibits appropriate insight and judgement.   ____________________________________________   LABS (all labs ordered are listed, but only abnormal results are displayed)  Labs Reviewed  RAPID INFLUENZA A&B ANTIGENS ([place]) - Abnormal; Notable for the following:    Influenza A ([facility]) POSITIVE (*)    All other components within normal limits  CULTURE, GROUP A STREP [facility])  POCT RAPID STREP A   ____________________________________________  EKG  None ____________________________________________  RADIOLOGY  None ____________________________________________    PROCEDURES  Procedure(s) performed: None    Medications - No data to display   ____________________________________________   INITIAL IMPRESSION / ASSESSMENT AND PLAN / ED COURSE  Pertinent lab results that were available during my care of the patient were reviewed by me and considered in my medical decision making (see chart for details).  Patient's diagnosis is consistent with influenza and viral sinusitis. Patient will be discharged home with prescriptions for Tamiflu, Flonase, Bromfed-DM to take as directed.  Patient may continue over-the-counter Tylenol or ibuprofen as needed for fever and aches. Patient is to follow up with her primary care provider or [facility] if symptoms persist past this treatment course. Patient is given ED precautions to return to the ED for any worsening or new symptoms.    ____________________________________________  FINAL CLINICAL IMPRESSION(S) / ED DIAGNOSES  Final diagnoses:  Influenza  Viral sinusitis      NEW MEDICATIONS STARTED DURING THIS VISIT:  Discharge Medication List as of 03/21/2016  3:29 PM    START taking these medications   Details  brompheniramine-pseudoephedrine-DM 30-2-10 MG/5ML syrup Take 10 mLs by mouth 4 (four) times daily as needed., Starting 03/21/2016, Until Discontinued, Print    fluticasone (FLONASE) 50 MCG/ACT nasal spray Place 2 sprays into both nostrils daily., Starting 03/21/2016, Until Discontinued, Print    oseltamivir (TAMIFLU) 75 MG capsule Take 1 capsule (75 mg total) by mouth 2 (two) times daily., Starting 03/21/2016, Until Discontinued, Print             Braxton Feathers, PA-C 03/21/16  [place], MD 03/21/16 [phone]

## 2016-03-21 NOTE — Discharge Instructions (Signed)

## 2016-03-24 LAB — CULTURE, GROUP A STREP (THRC)

## 2016-04-16 ENCOUNTER — Other Ambulatory Visit: Payer: Self-pay | Admitting: Family Medicine

## 2016-04-16 NOTE — Telephone Encounter (Signed)
Appointment Letter mailed asking patient to call the office and schedule her yearly physical.

## 2016-05-14 ENCOUNTER — Other Ambulatory Visit: Payer: Self-pay | Admitting: Family Medicine

## 2016-05-14 MED ORDER — NORELGESTROMIN-ETH ESTRADIOL 150-35 MCG/24HR TD PTWK: 1.0000 | MEDICATED_PATCH | TRANSDERMAL | Status: DC

## 2016-06-11 ENCOUNTER — Ambulatory Visit (INDEPENDENT_AMBULATORY_CARE_PROVIDER_SITE_OTHER): Payer: 59 | Admitting: Family Medicine

## 2016-06-11 ENCOUNTER — Encounter: Payer: Self-pay | Admitting: Family Medicine

## 2016-06-11 VITALS — BP 112/70 | HR 81 | Temp 98.2°F | Ht 66.5 in | Wt 264.0 lb

## 2016-06-11 DIAGNOSIS — E669 Obesity, unspecified: Secondary | ICD-10-CM | POA: Diagnosis not present

## 2016-06-11 DIAGNOSIS — G8929 Other chronic pain: Secondary | ICD-10-CM | POA: Diagnosis not present

## 2016-06-11 DIAGNOSIS — Z0000 Encounter for general adult medical examination without abnormal findings: Secondary | ICD-10-CM

## 2016-06-11 DIAGNOSIS — M546 Pain in thoracic spine: Secondary | ICD-10-CM | POA: Diagnosis not present

## 2016-06-11 DIAGNOSIS — M549 Dorsalgia, unspecified: Secondary | ICD-10-CM

## 2016-06-11 MED ORDER — DICLOFENAC SODIUM 75 MG PO TBEC: 75.0000 mg | DELAYED_RELEASE_TABLET | Freq: Two times a day (BID) | ORAL | Status: DC

## 2016-06-11 NOTE — Progress Notes (Signed)
Pre visit review using our clinic review tool, if applicable. No additional management support is needed unless otherwise documented below in the visit note. 

## 2016-06-11 NOTE — Patient Instructions (Addendum)
Start home physical therapy at home.  Can try diclofenac twice daily in place of tylenol.. Take on full stomach. Massage, heat on upper back.  Work on Acupuncturist of work Network engineer.  Call if not improving as expected.

## 2016-06-11 NOTE — Assessment & Plan Note (Signed)
NSAID, heat, massage, home PT. Work on [employer] loss.  X-ray at ortho negative.

## 2016-06-11 NOTE — Progress Notes (Signed)
The patient is here for annual wellness exam and preventative care.  ll.   She has been having  Upper back pain in last year. Tylenol not helping with pain. No known injury but she does walk on hard cement floors at work.  She has been wearing posture belt.. No longer helping.  She works in a lab stooped over.  Pain in center between shoulder blades. No radiation of pain.  No numbness or tingling, no weakness in arms.  Tylenol  650 and bayerfor pain.Marland Kitchen Helping minimaly.  Advil, aleve does not work for her. She saw ORTHO for this issue.. Few months ago. Sent PT (helped some, but could not afford it), neg X-ray.  Mood doing well overall. Went to Social worker. Having panic attacks rarely.  Working third shift at lab.  Menses regular, no dysmenorrhen patches for contraception. No current migraines.  Exercise: Hard given 3rd shift, trying to walk more Diet: poor, working on improving. Not eating out, drink more water.   Body mass index is 41.98 kg/(m^2).  Wt Readings from Last 3 Encounters:  06/11/16 264 lb (119.75 kg)  03/21/16 276 lb (125.193 kg)  07/16/15 261 lb (118.389 kg)   BP Readings from Last 3 Encounters:  06/11/16 112/70  03/21/16 128/71  07/16/15 120/80     History   Social History  . Marital Status: Married    Spouse Name: N/A    Number of Children: N/A  . Years of Education: N/A   Occupational History  . STUDENT    Social History Main Topics  . Smoking status: Never Smoker   . Smokeless tobacco: None  . Alcohol Use: Yes     Comment: OCC  . Drug Use: No  . Sexually Active: Not Currently   Other Topics Concern  . None   Social History Narrative   REGULAR EXERCISE-NO   Healthy eating.   HAS EXPERIENCED PHYSICAL ABUSE   SLEEPS 7-9 HOURS PER NIGHT   5 PEOPLE LIVING IN THE RESIDENCE     Review of Systems  Constitutional:  Negative for fever, fatigue and unexpected weight change.  HENT: Negative for ear pain, sore throat, sneezing, trouble swallowing and sinus pressure.  Eyes: Negative for pain and itching.  Respiratory: Negative for cough, shortness of breath and wheezing.  Cardiovascular: Negative for chest pain, palpitations and leg swelling.  Gastrointestinal: Negative for nausea, abdominal pain, diarrhea, constipation and blood in stool.  Genitourinary: Negative for dysuria, hematuria, vaginal discharge, difficulty urinating and menstrual problem.  Skin: Negative for rash.  Neurological: Negative for syncope, weakness, light-headedness, numbness and headaches.  Psychiatric/Behavioral: Negative for confusion and dysphoric mood. The patient is not nervous/anxious.      Objective:  Physical Exam  Constitutional: Vital signs are normal. She appears well-developed and well-nourished. She is cooperative. Non-toxic appearance. She does not appear ill. No distress.  obese  HENT:  Head: Normocephalic.  Right Ear: Hearing, tympanic membrane, external ear and ear canal normal.  Left Ear: Hearing, tympanic membrane, external ear and ear canal normal.  Nose: Nose normal.  Eyes: Conjunctivae, EOM and lids are normal. Pupils are equal, round, and reactive to light. No foreign bodies found.  Neck: Trachea normal and normal range of motion. Neck supple. Carotid bruit is not present. No mass and no thyromegaly present.  Cardiovascular: Normal rate, regular rhythm, S1 normal, S2 normal, normal heart sounds and intact distal pulses. Exam reveals no gallop.  No murmur heard. Pulmonary/Chest: Effort normal and breath sounds normal. No respiratory distress. She  has no wheezes. She has no rhonchi. She has no rales.  Abdominal: Soft. Normal appearance and bowel sounds are normal. She exhibits no distension, no fluid wave, no abdominal bruit and no mass. There is no hepatosplenomegaly. There is no  tenderness. There is no rebound, no guarding and no CVA tenderness. No hernia.  Genitourinary: Vagina normal and uterus normal. No breast swelling, tenderness, discharge or bleeding. Pelvic exam was performed with patient supine. There is no rash, tenderness or lesion on the right labia. There is no rash, tenderness or lesion on the left labia. Uterus is not enlarged and not tender.. Right adnexum displays no mass, no tenderness and no fullness. Left adnexum displays no mass, no tenderness and no fullness.  Lymphadenopathy:   She has no cervical adenopathy.   She has no axillary adenopathy.  Neurological: She is alert. She has normal strength. No cranial nerve deficit or sensory deficit.  Skin: Skin is warm, dry and intact. No rash noted.  Psychiatric: Her speech is normal and behavior is normal. Judgment normal. Her mood appears not anxious. Cognition and memory are normal. She does not exhibit a depressed mood.         Assessment & Plan:  The patient's preventative maintenance and recommended screening tests for an annual wellness exam were reviewed in full today. Brought up to date unless services declined.  Counselled on the importance of diet, exercise, and its role in overall health and mortality. The patient's FH and SH was reviewed, including their home life, tobacco status, and drug and alcohol status.   Vaccines: uptodate. Non smoker PAP/DVE:last pap 2016 nml and no HPV, repeat in 3 years. Needs yearly DVE. STD screening: Denies need

## 2016-06-15 ENCOUNTER — Other Ambulatory Visit: Payer: Self-pay | Admitting: Family Medicine

## 2016-10-20 ENCOUNTER — Encounter: Payer: Self-pay | Admitting: Family Medicine

## 2016-10-20 ENCOUNTER — Ambulatory Visit (INDEPENDENT_AMBULATORY_CARE_PROVIDER_SITE_OTHER): Payer: 59 | Admitting: Family Medicine

## 2016-10-20 VITALS — BP 108/60 | HR 94 | Temp 98.3°F | Wt 274.0 lb

## 2016-10-20 DIAGNOSIS — Z23 Encounter for immunization: Secondary | ICD-10-CM

## 2016-10-20 DIAGNOSIS — M549 Dorsalgia, unspecified: Secondary | ICD-10-CM

## 2016-10-20 DIAGNOSIS — G8929 Other chronic pain: Secondary | ICD-10-CM

## 2016-10-20 MED ORDER — CYCLOBENZAPRINE HCL 5 MG PO TABS: 5.0000 mg | ORAL_TABLET | Freq: Every day | ORAL | 0 refills | Status: DC

## 2016-10-20 NOTE — Patient Instructions (Signed)
It was nice to meet you. Please take flexeril at bedtime for the next 2 weeks.  Call us with an update.

## 2016-10-20 NOTE — Progress Notes (Signed)
Pre visit review using our clinic review tool, if applicable. No additional management support is needed unless otherwise documented below in the visit note. 

## 2016-10-20 NOTE — Progress Notes (Signed)
Subjective:   Patient ID: [name], female    DOB: [birth year], 25 y.o.   MRN: [id number]  [name] is a pleasant 25 y.o. year old female patient of Dr. Diona Browner, new to me, who presents to clinic today with Back Pain (denies any urinary s/s) and Numbness (hands bilateral)  on 10/20/2016  HPI:  Back pain-  Chart reviewed.  Has been evaluated for chronic upper back pain by ortho and PT.  PT helped but she could not afford to continue. Xrays were neg.  Pain is in the center of her shoulder bladed.  Works in a lab stooped over.  Taking Tylenol and Bayer for pain.  Does not help much. Diclofenac upset her stomach.  Now hands are starting to tingle bilaterally which is new.  Posture belt helps sometimes.  Current Outpatient Prescriptions on File Prior to Visit  Medication Sig Dispense Refill  . diclofenac (VOLTAREN) 75 MG EC tablet Take 1 tablet (75 mg total) by mouth 2 (two) times daily. 30 tablet 0  . XULANE 150-35 MCG/24HR transdermal patch PLACE 1 PATCH ONTO THE SKIN ONCE A WEEK. 3 patch 11   No current facility-administered medications on file prior to visit.     No Known Allergies  Past Medical History:  Diagnosis Date  . Migraine   . Polycystic ovaries     No past surgical history on file.  Family History  Problem Relation Age of Onset  . Leukemia Mother 40  . Hypertension Father     Social History   Social History  . Marital status: Single    Spouse name: N/A  . Number of children: 0  . Years of education: N/A   Occupational History  . Microbiology lab [employer]   Social History Main Topics  . Smoking status: Never Smoker  . Smokeless tobacco: Never Used  . Alcohol use Yes     Comment: OCC  . Drug use: No  . Sexual activity: Not Currently   Other Topics Concern  . Not on file   Social History Narrative   REGULAR EXERCISE-NO   Healthy eating.   HAS EXPERIENCED PHYSICAL ABUSE   SLEEPS 7-9 HOURS PER NIGHT   5 PEOPLE LIVING IN THE  RESIDENCE   The PMH, PSH, Social History, Family History, Medications, and allergies have been reviewed in [facility], and have been updated if relevant.     Review of Systems  Constitutional: Negative.   Musculoskeletal: Positive for back pain, neck pain and neck stiffness.  Neurological: Positive for numbness. Negative for tremors, facial asymmetry, weakness, light-headedness and headaches.  All other systems reviewed and are negative.      Objective:    BP 108/60   Pulse 94   Temp 98.3 F (36.8 C) (Oral)   Wt 274 lb (124.3 kg)   LMP 09/27/2016   SpO2 98%   BMI 43.56 kg/m    Physical Exam  Constitutional: She appears well-developed and well-nourished. No distress.  HENT:  Head: Normocephalic.  Eyes: Conjunctivae are normal.  Cardiovascular: Normal rate.   Pulmonary/Chest: Effort normal.  Musculoskeletal:       Cervical back: She exhibits spasm. She exhibits normal range of motion, no tenderness, no bony tenderness and no pain.       Back:  Skin: Skin is warm and dry. She is not diaphoretic.  Psychiatric: She has a normal mood and affect. Her behavior is normal. Judgment and thought content normal.  Nursing note and vitals reviewed.  Assessment & Plan:   Chronic upper back pain  Need for influenza vaccination - Plan: Flu Vaccine QUAD 36+ mos PF IM (Fluarix & Fluzone Quad PF) No Follow-up on file.

## 2016-10-20 NOTE — Assessment & Plan Note (Signed)
Chronic issue. Acutely deteriorated.  eRx sent for flexeril 5 mg qhs. She does not want to return to PT. Advised with new numbness, she may need MRI of cervical spine if symptoms persist. The patient indicates understanding of these issues and agrees with the plan.

## 2016-12-07 ENCOUNTER — Other Ambulatory Visit: Payer: Self-pay | Admitting: Family Medicine

## 2016-12-08 NOTE — Telephone Encounter (Signed)
Last office visit 10/20/2016 with Dr. Deborra Medina.  Last refilled 10/20/2016 for #30 with no refills.  Ok to refill?

## 2017-01-26 ENCOUNTER — Other Ambulatory Visit: Payer: Self-pay | Admitting: Family Medicine

## 2017-01-26 NOTE — Telephone Encounter (Signed)
Last office visit 10/20/2016 with Dr. Deborra Medina.  Last refilled 12/08/2016 for #30 with no refills.  Ok to refill?

## 2017-05-04 ENCOUNTER — Other Ambulatory Visit: Payer: Self-pay | Admitting: Family Medicine

## 2017-05-04 MED ORDER — CYCLOBENZAPRINE HCL 5 MG PO TABS: 5.0000 mg | ORAL_TABLET | Freq: Every day | ORAL | 0 refills | Status: DC

## 2017-05-04 NOTE — Telephone Encounter (Signed)
Duplicate request

## 2017-05-04 NOTE — Telephone Encounter (Signed)
Last office visit 09/30/2016 with Dr. Deborra Medina.  Last refilled 01/26/2017 for #30 with no refills.  Ok to refill?

## 2017-06-09 ENCOUNTER — Other Ambulatory Visit: Payer: Self-pay | Admitting: Family Medicine

## 2017-06-25 ENCOUNTER — Encounter: Payer: Self-pay | Admitting: Family Medicine

## 2017-06-25 ENCOUNTER — Ambulatory Visit (INDEPENDENT_AMBULATORY_CARE_PROVIDER_SITE_OTHER): Payer: 59 | Admitting: Family Medicine

## 2017-06-25 DIAGNOSIS — M549 Dorsalgia, unspecified: Secondary | ICD-10-CM

## 2017-06-25 DIAGNOSIS — G8929 Other chronic pain: Secondary | ICD-10-CM

## 2017-06-25 NOTE — Progress Notes (Signed)
Subjective:    Patient ID: [name], female    DOB: 16-Sep-1991, 26 y.o.   MRN: [id number]  HPI   26 year old female with hx of PCOS pt presents to discuss weight loss options as well as chronic upper back pain.   1. Morbid obesity:  Inconsistent exercise. Trying to decrease eating out/fast foods. Increasing water in diet. Loves sugar and carbs. She skips meals then eats larger meals   She works third shift. Working 10-12 hours.  Body mass index is 44.68 kg/m.    Wt Readings from Last 3 Encounters:  06/25/17 281 lb (127.5 kg)  10/20/16 274 lb (124.3 kg)  06/11/16 264 lb (119.7 kg)   2. Chronic  Upper back pain:  Ongoing x 2 years. No initial injury or fall.  Stands on cement floor at work, repetitive motion at work at [employer]. In shoulder blade area.  No numbness or tingling or weakness in arms or legs currently. Did having tingling in hands and weakness few months ago.Marland Kitchen Resolved now.  She has been using a posture belt that helps with the pain some.  She is trying to exercise: go to aerobics classes, but irregularly. Using tylenol occ for pain. No heat or ice.    Hx of migraines  no recent labs.   Blood pressure 126/74, pulse 95, temperature 98.1 F (36.7 C), temperature source Oral, weight 281 lb (127.5 kg), SpO2 98 %.   Review of Systems  Constitutional: Negative for fatigue and fever.  HENT: Negative for congestion.   Eyes: Negative for pain.  Respiratory: Negative for cough and shortness of breath.   Cardiovascular: Negative for chest pain, palpitations and leg swelling.  Gastrointestinal: Negative for abdominal pain.  Genitourinary: Negative for dysuria and vaginal bleeding.  Musculoskeletal: Positive for back pain.  Neurological: Negative for syncope, light-headedness and headaches.  Psychiatric/Behavioral: Negative for dysphoric mood.       Objective:   Physical Exam  Constitutional: Vital signs are normal. She appears well-developed and  well-nourished. She is cooperative.  Non-toxic appearance. She does not appear ill. No distress.  obese  HENT:  Head: Normocephalic.  Right Ear: Hearing, tympanic membrane, external ear and ear canal normal. Tympanic membrane is not erythematous, not retracted and not bulging.  Left Ear: Hearing, tympanic membrane, external ear and ear canal normal. Tympanic membrane is not erythematous, not retracted and not bulging.  Nose: No mucosal edema or rhinorrhea. Right sinus exhibits no maxillary sinus tenderness and no frontal sinus tenderness. Left sinus exhibits no maxillary sinus tenderness and no frontal sinus tenderness.  Mouth/Throat: Uvula is midline, oropharynx is clear and moist and mucous membranes are normal.  Eyes: Pupils are equal, round, and reactive to light. Conjunctivae, EOM and lids are normal. Lids are everted and swept, no foreign bodies found.  Neck: Trachea normal and normal range of motion. Neck supple. Carotid bruit is not present. No thyroid mass and no thyromegaly present.  Cardiovascular: Normal rate, regular rhythm, S1 normal, S2 normal, normal heart sounds, intact distal pulses and normal pulses.  Exam reveals no gallop and no friction rub.   No murmur heard. Pulmonary/Chest: Effort normal and breath sounds normal. No tachypnea. No respiratory distress. She has no decreased breath sounds. She has no wheezes. She has no rhonchi. She has no rales.  Abdominal: Soft. Normal appearance and bowel sounds are normal. There is no tenderness.  Musculoskeletal:       Cervical back: She exhibits tenderness. She exhibits normal range of  motion and no bony tenderness.       Thoracic back: She exhibits tenderness. She exhibits normal range of motion and no bony tenderness.       Lumbar back: She exhibits normal range of motion, no tenderness and no bony tenderness.  Neurological: She is alert.  Skin: Skin is warm, dry and intact. No rash noted.  Psychiatric: Her speech is normal and  behavior is normal. Judgment and thought content normal. Her mood appears not anxious. Cognition and memory are normal. She does not exhibit a depressed mood.          Assessment & Plan:

## 2017-06-25 NOTE — Patient Instructions (Addendum)
Work on not skipping meal, no fast foods, decrease eating out, bring meals work.  Keep up with water.  Goal trying to exercise 3 days a week.  Start home physical therapy for upper back pain.   Use tylenol for pain.  Use heat on upper back.

## 2017-06-28 ENCOUNTER — Other Ambulatory Visit: Payer: 59

## 2017-06-29 ENCOUNTER — Other Ambulatory Visit (INDEPENDENT_AMBULATORY_CARE_PROVIDER_SITE_OTHER): Payer: 59

## 2017-06-29 LAB — COMPREHENSIVE METABOLIC PANEL
ALBUMIN: 4.1 g/dL (ref 3.5–5.2)
ALK PHOS: 77 U/L (ref 39–117)
ALT: 13 U/L (ref 0–35)
AST: 17 U/L (ref 0–37)
BUN: 12 mg/dL (ref 6–23)
CALCIUM: 9.3 mg/dL (ref 8.4–10.5)
CO2: 28 mEq/L (ref 19–32)
CREATININE: 0.95 mg/dL (ref 0.40–1.20)
Chloride: 104 mEq/L (ref 96–112)
GFR: 90.49 mL/min (ref >60.00)
Glucose, Bld: 87 mg/dL (ref 70–99)
Potassium: 3.7 mEq/L (ref 3.5–5.1)
SODIUM: 139 meq/L (ref 135–145)
TOTAL PROTEIN: 7.3 g/dL (ref 6.0–8.3)
Total Bilirubin: 0.2 mg/dL (ref 0.2–1.2)

## 2017-06-29 LAB — LIPID PANEL
CHOLESTEROL: 146 mg/dL (ref 0–200)
HDL: 57.8 mg/dL (ref >39.00)
LDL Cholesterol: 65 mg/dL (ref 0–99)
NonHDL: 87.84
TRIGLYCERIDES: 112 mg/dL (ref 0.0–149.0)
Total CHOL/HDL Ratio: 3
VLDL: 22.4 mg/dL (ref 0.0–40.0)

## 2017-06-29 LAB — T4, FREE: Free T4: 0.78 ng/dL (ref 0.60–1.60)

## 2017-06-29 LAB — T3, FREE: T3, Free: 4.1 pg/mL (ref 2.3–4.2)

## 2017-06-29 LAB — HEMOGLOBIN A1C: HEMOGLOBIN A1C: 5.9 % (ref 4.6–6.5)

## 2017-06-29 LAB — TSH: TSH: 2.77 u[IU]/mL (ref 0.35–4.50)

## 2017-07-08 NOTE — Assessment & Plan Note (Signed)
Start home physical therapy for upper back pain.   Use tylenol for pain.  Use heat on upper back.

## 2017-07-08 NOTE — Assessment & Plan Note (Signed)
Counseled in detail on diet and exercise chnges to facilitate weight loss. Discussed medications and SE.  Close follow up for update, weight check and encouragemnet in 4 weeks at [facility].

## 2017-07-09 ENCOUNTER — Other Ambulatory Visit: Payer: Self-pay | Admitting: Family Medicine

## 2017-07-23 ENCOUNTER — Ambulatory Visit (INDEPENDENT_AMBULATORY_CARE_PROVIDER_SITE_OTHER): Payer: 59 | Admitting: Family Medicine

## 2017-07-23 ENCOUNTER — Encounter: Payer: Self-pay | Admitting: Family Medicine

## 2017-07-23 VITALS — BP 114/80 | HR 102 | Temp 98.4°F | Ht 66.5 in | Wt 278.2 lb

## 2017-07-23 DIAGNOSIS — Z0000 Encounter for general adult medical examination without abnormal findings: Secondary | ICD-10-CM

## 2017-07-23 DIAGNOSIS — F321 Major depressive disorder, single episode, moderate: Secondary | ICD-10-CM | POA: Diagnosis not present

## 2017-07-23 MED ORDER — VENLAFAXINE HCL ER 37.5 MG PO CP24: ORAL_CAPSULE | ORAL | 1 refills | Status: DC

## 2017-07-23 NOTE — Progress Notes (Signed)
Subjective:    Patient ID: [name], female    DOB: [birth year], 26 y.o.   MRN: [id number]  HPI   The patient is here for annual wellness exam and preventative care.   Doing well overall.   She has been having worsening of mood in last 9-12 months.  Was treating with seeing a counselor.  Has felt a little better in last 2 weeks but PHQ13  Has anhedonia, feels empty.  Decreased motivation. Was not caring for herself. Not bathing or washing clothes.. 2 weeks ago had friends help her.  She has been isolating herself.  She is anxious and overwhlemed at times... Better off caffeine.  She works third shift and is going to second.  Sleep is moderate. She has never used med to treat in past.  Reviewed labs in detail. CMET nml, Cholesterol improving  Morbid Obesity Wt Readings from Last 3 Encounters:  07/23/17 278 lb 4 oz (126.2 kg)  06/25/17 281 lb (127.5 kg)  10/20/16 274 lb (124.3 kg)  Body mass index is 44.24 kg/m.  Menses regular on xulane.   Exercise: working on trying to move it into her schedule,  1-2 times a week, increasing stairs  Diet:healthy diet, cooking more  Social History /Family History/Past Medical History reviewed in detail and updated in EMR if needed. Blood pressure 114/80, pulse (!) 102, temperature 98.4 F (36.9 C), temperature source Oral, height 5' 6.5" (1.689 m), weight 278 lb 4 oz (126.2 kg), last menstrual period 07/09/2017.  Review of Systems  Constitutional: Negative for fatigue and fever.  HENT: Negative for congestion.   Eyes: Negative for pain.  Respiratory: Negative for cough and shortness of breath.   Cardiovascular: Negative for chest pain, palpitations and leg swelling.  Gastrointestinal: Positive for abdominal distention. Negative for abdominal pain.        Seems to be sensitive to gluten and dairy.  Genitourinary: Negative for dysuria and vaginal bleeding.  Musculoskeletal: Negative for back pain.  Neurological: Negative for  syncope, light-headedness and headaches.  Psychiatric/Behavioral: Negative for dysphoric mood.       Objective:   Physical Exam  Constitutional: Vital signs are normal. She appears well-developed and well-nourished. She is cooperative.  Non-toxic appearance. She does not appear ill. No distress.  HENT:  Head: Normocephalic.  Right Ear: Hearing, tympanic membrane, external ear and ear canal normal.  Left Ear: Hearing, tympanic membrane, external ear and ear canal normal.  Nose: Nose normal.  Eyes: Pupils are equal, round, and reactive to light. Conjunctivae, EOM and lids are normal. Lids are everted and swept, no foreign bodies found.  Neck: Trachea normal and normal range of motion. Neck supple. Carotid bruit is not present. No thyroid mass and no thyromegaly present.  Cardiovascular: Normal rate, regular rhythm, S1 normal, S2 normal, normal heart sounds and intact distal pulses.  Exam reveals no gallop.   No murmur heard. Pulmonary/Chest: Effort normal and breath sounds normal. No respiratory distress. She has no wheezes. She has no rhonchi. She has no rales.  Abdominal: Soft. Normal appearance and bowel sounds are normal. She exhibits no distension, no fluid wave, no abdominal bruit and no mass. There is no hepatosplenomegaly. There is no tenderness. There is no rebound, no guarding and no CVA tenderness. No hernia.  Lymphadenopathy:    She has no cervical adenopathy.    She has no axillary adenopathy.  Neurological: She is alert. She has normal strength. No cranial nerve deficit or sensory deficit.  Skin: Skin is  warm, dry and intact. No rash noted.  Psychiatric: Her speech is normal and behavior is normal. Judgment normal. Her mood appears not anxious. Cognition and memory are normal. She does not exhibit a depressed mood.          Assessment & Plan:  The patient's preventative maintenance and recommended screening tests for an annual wellness exam were reviewed in full  today. Brought up to date unless services declined.  Counselled on the importance of diet, exercise, and its role in overall health and mortality. The patient's FH and SH was reviewed, including their home life, tobacco status, and drug and alcohol status.   Vaccines: uptodate. Non smoker PAP/DVE:last pap 2016 nml and no HPV, repeat in 3 years.  STD screening: Denies need

## 2017-07-23 NOTE — Patient Instructions (Addendum)
Start venlafaxine at bedtime, increase after 1 week if tolerating.  Work on increasing exercise and low carbohydrate diet.

## 2017-07-23 NOTE — Assessment & Plan Note (Signed)
Encouraged exercise, weight loss, healthy eating habits. ? ?

## 2017-07-23 NOTE — Assessment & Plan Note (Signed)
Start Effexor. Continue counseling.  Follow up in 4 weeks.

## 2017-08-09 ENCOUNTER — Other Ambulatory Visit: Payer: Self-pay | Admitting: Family Medicine

## 2017-08-25 ENCOUNTER — Other Ambulatory Visit: Payer: Self-pay | Admitting: Family Medicine

## 2017-08-25 MED ORDER — CYCLOBENZAPRINE HCL 5 MG PO TABS: 5.0000 mg | ORAL_TABLET | Freq: Every day | ORAL | 0 refills | Status: DC

## 2017-08-25 MED ORDER — NORELGESTROMIN-ETH ESTRADIOL 150-35 MCG/24HR TD PTWK: 1.0000 | MEDICATED_PATCH | TRANSDERMAL | 11 refills | Status: DC

## 2017-08-25 NOTE — Telephone Encounter (Signed)
Last office visit 07/23/2017.  Last refilled 05/04/2017 for #30 with no refills.  Ok to refill?

## 2017-08-31 ENCOUNTER — Encounter: Payer: Self-pay | Admitting: Family Medicine

## 2017-08-31 ENCOUNTER — Ambulatory Visit (INDEPENDENT_AMBULATORY_CARE_PROVIDER_SITE_OTHER): Payer: 59 | Admitting: Family Medicine

## 2017-08-31 VITALS — BP 120/92 | HR 81 | Temp 98.6°F | Ht 66.5 in | Wt 273.8 lb

## 2017-08-31 DIAGNOSIS — F321 Major depressive disorder, single episode, moderate: Secondary | ICD-10-CM | POA: Diagnosis not present

## 2017-08-31 NOTE — Assessment & Plan Note (Signed)
Continue current dose of venlafaxine.   Start regular exercise, give more time to get used to schedule changes.  Follow up in 4 weeks.

## 2017-08-31 NOTE — Progress Notes (Signed)
Subjective:    Patient ID: [name], female    DOB: 1991-05-26, 26 y.o.   MRN: [id number]  HPI    26 year old female presents for follow up major depressive disorder.  At last OV on  07/23/2017 she was started on venlafaxine   Counseling was also recommended for her to continue.  Today she reports: She has noted some mild improvement in mood. Initially she had some blurry vision.. But this may have been needing glasses. She has noted decreased appetite and eating less, occ  mild nausea. She did have a schedule change which is messing her up.. Hard to tell if continued symptoms  Are from schedule adjustment.  She is still isolating herself.. Not exercising.  She did have a bad day 3 days ago where she did not leave her bed.  Depression screen [facility] 2/9 08/31/2017 07/23/2017  Decreased Interest 2 2  Down, Depressed, Hopeless 1 1  PHQ - 2 Score 3 3  Altered sleeping 2 2  Tired, decreased energy 2 2  Change in appetite 2 2  Feeling bad or failure about yourself  1 1  Trouble concentrating 2 2  Moving slowly or fidgety/restless 1 1  Suicidal thoughts 0 0  PHQ-9 Score 13 13  Difficult doing work/chores Somewhat difficult -      Morbid obesity: She is eating well overall.  She has lost almost 10 lbs in last 2 months.  Wt Readings from Last 3 Encounters:  08/31/17 273 lb 12 oz (124.2 kg)  07/23/17 278 lb 4 oz (126.2 kg)  06/25/17 281 lb (127.5 kg)     Review of Systems  Constitutional: Negative for fatigue and fever.  HENT: Negative for ear pain.   Eyes: Negative for pain.  Respiratory: Negative for chest tightness and shortness of breath.   Cardiovascular: Negative for chest pain, palpitations and leg swelling.  Gastrointestinal: Negative for abdominal pain.  Genitourinary: Negative for dysuria.       Objective:   Physical Exam  Constitutional: Vital signs are normal. She appears well-developed and well-nourished. She is cooperative.  Non-toxic appearance. She does  not appear ill. No distress.  HENT:  Head: Normocephalic.  Right Ear: Hearing, tympanic membrane, external ear and ear canal normal. Tympanic membrane is not erythematous, not retracted and not bulging.  Left Ear: Hearing, tympanic membrane, external ear and ear canal normal. Tympanic membrane is not erythematous, not retracted and not bulging.  Nose: No mucosal edema or rhinorrhea. Right sinus exhibits no maxillary sinus tenderness and no frontal sinus tenderness. Left sinus exhibits no maxillary sinus tenderness and no frontal sinus tenderness.  Mouth/Throat: Uvula is midline, oropharynx is clear and moist and mucous membranes are normal.  Eyes: Pupils are equal, round, and reactive to light. Conjunctivae, EOM and lids are normal. Lids are everted and swept, no foreign bodies found.  Neck: Trachea normal and normal range of motion. Neck supple. Carotid bruit is not present. No thyroid mass and no thyromegaly present.  Cardiovascular: Normal rate, regular rhythm, S1 normal, S2 normal, normal heart sounds, intact distal pulses and normal pulses.  Exam reveals no gallop and no friction rub.   No murmur heard. Pulmonary/Chest: Effort normal and breath sounds normal. No tachypnea. No respiratory distress. She has no decreased breath sounds. She has no wheezes. She has no rhonchi. She has no rales.  Abdominal: Soft. Normal appearance and bowel sounds are normal. There is no tenderness.  Neurological: She is alert.  Skin: Skin is warm,  dry and intact. No rash noted.  Psychiatric: Her speech is normal and behavior is normal. Judgment and thought content normal. Her mood appears not anxious. Her affect is blunt. Cognition and memory are normal. She does not exhibit a depressed mood. She expresses no homicidal and no suicidal ideation. She expresses no suicidal plans and no homicidal plans.          Assessment & Plan:

## 2017-08-31 NOTE — Patient Instructions (Signed)
Continue counseling.  Continue venlafaxine at current dose.  Try to continue getting out of the house, staying active.

## 2017-09-03 ENCOUNTER — Other Ambulatory Visit: Payer: Self-pay | Admitting: Family Medicine

## 2017-09-28 ENCOUNTER — Other Ambulatory Visit: Payer: Self-pay | Admitting: Family Medicine

## 2017-09-28 ENCOUNTER — Telehealth: Payer: Self-pay | Admitting: Family Medicine

## 2017-09-28 MED ORDER — VENLAFAXINE HCL ER 75 MG PO TB24: ORAL_TABLET | ORAL | 0 refills | Status: DC

## 2017-09-28 NOTE — Telephone Encounter (Signed)
Rx refilled.

## 2017-09-29 ENCOUNTER — Telehealth: Payer: Self-pay

## 2017-09-29 MED ORDER — VENLAFAXINE HCL ER 75 MG PO CP24: 75.0000 mg | ORAL_CAPSULE | Freq: Every day | ORAL | 0 refills | Status: DC

## 2017-09-29 NOTE — Telephone Encounter (Signed)
Okay to change to capsules.

## 2017-09-29 NOTE — Telephone Encounter (Signed)
Rx for Venlafaxine XR 75 mg capsules sent into [facility] in [place].

## 2017-09-29 NOTE — Telephone Encounter (Signed)
PLEASE NOTE: All timestamps contained within this report are represented as [place] Standard Time. CONFIDENTIALTY NOTICE: This fax transmission is intended only for the addressee. It contains information that is legally privileged, confidential or otherwise protected from use or disclosure. If you are not the intended recipient, you are strictly prohibited from reviewing, disclosing, copying using or disseminating any of this information or taking any action in reliance on or regarding this information. If you have received this fax in error, please notify us immediately by telephone so that we can arrange for its return to [place]. Phone: [phone], Toll-Free: [phone], Fax: [phone] Page: 1 of 2 Call Id: [id number] [place] Patient Name: [name] Gender: Female DOB: 1991/12/21 Age: 26 Y 6 M 21 D Return Phone Number: Address: City/State/Zip: [place] Client [place] Client Site [place] Physician Eliezer Lofts - MD Contact Type Call Who Is Calling Pharmacy Call Type Pharmacy Send to RN Chief Complaint Prescription Refill or Medication Request (non symptomatic) Reason for Call Request to clarify medication order Initial Comment Caller with [facility] pharmacy needing clarification. Pharmacy Name [facility] Pharmacist Name Wilder Number [phone] Translation No Nurse Assessment Nurse: Terance Hart, RN, Doug Date/Time Eilene Ghazi Time): 09/28/2017 5:55:52 PM Please select the assessment type ---Pharmacy clarification Additional Documentation ---caller , pharmacist Greggory Stallion states that she would like effexor prescription changed from tabs to capsules for insurance coverage purposes . Is there an on-call physician for the client? ---Yes Do the client directives allow paging the on call for  medication concerns? ---Yes Document information that requires clarification. ---contacted on call physician Additional Documentation ---contacted the pharmacist Doly again to clarify effexor order ( change from tabs to capsules ) . Guidelines Guideline Title Affirmed Question Affirmed Notes Nurse Date/Time (Eastern Time) Disp. Time Eilene Ghazi Time) Disposition Final User 09/28/2017 6:07:20 PM Called On-Call Provider Terance Hart, RN, Doug 09/28/2017 6:11:16 PM Called On-Call Provider Terance Hart, RN, Doug Reason: at pharmacist request . 09/28/2017 6:15:31 PM Pharmacy Call Terance Hart, RN, Doug Reason: about prescription 09/28/2017 6:16:11 PM Clinical Call Yes Terance Hart, RN, Doug PLEASE NOTE: All timestamps contained within this report are represented as [place] Standard Time. CONFIDENTIALTY NOTICE: This fax transmission is intended only for the addressee. It contains information that is legally privileged, confidential or otherwise protected from use or disclosure. If you are not the intended recipient, you are strictly prohibited from reviewing, disclosing, copying using or disseminating any of this information or taking any action in reliance on or regarding this information. If you have received this fax in error, please notify us immediately by telephone so that we can arrange for its return to [place]. Phone: [phone], Toll-Free: [phone], Fax: [phone] Page: 2 of 2 Call Id: [id number] Paging DoctorName Phone DateTime Result/Outcome Message Type Notes Cathlean Cower- MD [phone] 09/28/2017 6:07:20 PM Called On Call Provider - Reached Doctor Paged Cathlean Cower- MD 09/28/2017 6:08:50 PM Spoke with On Call - General Message Result Dr Jenny Reichmann answered the call : may change effexor form from tablets to capsules on the prescription

## 2017-09-30 ENCOUNTER — Ambulatory Visit: Payer: 59 | Admitting: Family Medicine

## 2017-09-30 DIAGNOSIS — Z0289 Encounter for other administrative examinations: Secondary | ICD-10-CM

## 2017-10-23 ENCOUNTER — Emergency Department
Admission: EM | Admit: 2017-10-23 | Discharge: 2017-10-23 | Disposition: A | Discharge status: Home / Self Care | Payer: Worker's Compensation | Attending: Emergency Medicine | Admitting: Emergency Medicine

## 2017-10-23 ENCOUNTER — Encounter: Payer: Self-pay | Admitting: Emergency Medicine

## 2017-10-23 ENCOUNTER — Emergency Department: Payer: Worker's Compensation

## 2017-10-23 DIAGNOSIS — Y990 Civilian activity done for income or pay: Secondary | ICD-10-CM | POA: Diagnosis not present

## 2017-10-23 DIAGNOSIS — W108XXA Fall (on) (from) other stairs and steps, initial encounter: Secondary | ICD-10-CM | POA: Insufficient documentation

## 2017-10-23 DIAGNOSIS — Y9289 Other specified places as the place of occurrence of the external cause: Secondary | ICD-10-CM | POA: Diagnosis not present

## 2017-10-23 DIAGNOSIS — Z79899 Other long term (current) drug therapy: Secondary | ICD-10-CM | POA: Insufficient documentation

## 2017-10-23 DIAGNOSIS — S93492A Sprain of other ligament of left ankle, initial encounter: Secondary | ICD-10-CM | POA: Insufficient documentation

## 2017-10-23 DIAGNOSIS — Y9389 Activity, other specified: Secondary | ICD-10-CM | POA: Diagnosis not present

## 2017-10-23 DIAGNOSIS — S99912A Unspecified injury of left ankle, initial encounter: Secondary | ICD-10-CM | POA: Diagnosis present

## 2017-10-23 NOTE — ED Provider Notes (Signed)
[facility] Emergency Department Provider Note  ____________________________________________   First MD Initiated Contact with Patient 10/23/17 0259     (approximate)  I have reviewed the triage vital signs and the nursing notes.   HISTORY  Chief Complaint Ankle Pain   HPI [name] is a 26 y.o. female with a history of depression and migraines who is presenting to the emergency department today with left ankle pain.  She says that she was walking down a flight of stairs at work when she tripped down 2 stairs.  She says that she rolled her left ankle when falling and also lightly hit her right knee.  She denies any head trauma or loss of consciousness.  Denies any pain to her right knee at this time.  Does not remember if she inverted or everted the left ankle.  However, she is having pain down to the lateral aspect of the left ankle as well as the dorsum of the proximal foot.  Patient reports that she has been able to walk since the incident but with aching.    Past Medical History:  Diagnosis Date  . Migraine   . Polycystic ovaries     Patient Active Problem List   Diagnosis Date Noted  . Depression, major, single episode, moderate ([place]) 07/23/2017  . Chronic upper back pain 06/11/2016  . Morbid obesity ([place]) 06/26/2014  . NONSPEC REACT TUBERCULIN SKN TEST W/O ACTV TB 10/21/2009  . POLYCYSTIC OVARIAN DISEASE 04/15/2009  . ALLERGIC RHINITIS 04/15/2009  . MIGRAINES, HX OF 04/15/2009    History reviewed. No pertinent surgical history.  Prior to Admission medications   Medication Sig Start Date End Date Taking? Authorizing Provider  cyclobenzaprine (FLEXERIL) 5 MG tablet Take 1 tablet (5 mg total) by mouth at bedtime. 08/25/17   Jinny Sanders, MD  norelgestromin-ethinyl estradiol Marilu Favre) 150-35 MCG/24HR transdermal patch Place 1 patch onto the skin once a week. 08/25/17   Bedsole, Amy E, MD  venlafaxine XR (EFFEXOR XR) 75 MG 24 hr capsule Take  1 capsule (75 mg total) by mouth daily with breakfast. 09/29/17   Jinny Sanders, MD    Allergies Patient has no known allergies.  Family History  Problem Relation Age of Onset  . Leukemia Mother 43  . Hypertension Father     Social History Social History  Substance Use Topics  . Smoking status: Never Smoker  . Smokeless tobacco: Never Used  . Alcohol use Yes     Comment: OCC    Review of Systems  Constitutional: No fever/chills Eyes: No visual changes. ENT: No sore throat. Cardiovascular: Denies chest pain. Respiratory: Denies shortness of breath. Gastrointestinal: No abdominal pain.  No nausea, no vomiting.  No diarrhea.  No constipation. Genitourinary: Negative for dysuria. Musculoskeletal: Negative for back pain. Skin: Negative for rash. Neurological: Negative for headaches, focal weakness or numbness.   ____________________________________________   PHYSICAL EXAM:  VITAL SIGNS: ED Triage Vitals  Enc Vitals Group     BP 10/23/17 0137 (!) 147/95     Pulse Rate 10/23/17 0137 (!) 106     Resp 10/23/17 0137 16     Temp 10/23/17 0137 98.7 F (37.1 C)     Temp Source 10/23/17 0137 Oral     SpO2 10/23/17 0137 99 %     Weight --      Height --      Head Circumference --      Peak Flow --      Pain  Score 10/23/17 0136 6     Pain Loc --      Pain Edu? --      Excl. in [place]? --     Constitutional: Alert and oriented. Well appearing and in no acute distress. Eyes: Conjunctivae are normal.  Head: Atraumatic. Nose: No congestion/rhinnorhea. Mouth/Throat: Mucous membranes are moist.  Neck: No stridor.   Cardiovascular: Normal rate, regular rhythm. Grossly normal heart sounds.  Good peripheral circulation with an intact left-sided dorsalis pedis pulse. Respiratory: Normal respiratory effort.  No retractions. Lungs CTAB. Gastrointestinal: Soft and nontender. No distention. No CVA tenderness. Musculoskeletal:   Tenderness to palpation over the proximal dorsum of  the lateral left foot where there is ecchymosis and a small amount of swelling.  The area of ecchymosis and swelling is about 4 x 4 cm.  No tenderness to the bilateral malleoli and the patient is able to range the ankle but with pain.  Distal to the site of the injury the patient is neurovascularly intact with brisk capillary refill sensation to light touch and full range of motion.  Neurologic:  Normal speech and language. No gross focal neurologic deficits are appreciated. Skin:  Skin is warm, dry and intact. No rash noted. Psychiatric: Mood and affect are normal. Speech and behavior are normal.  ____________________________________________   LABS (all labs ordered are listed, but only abnormal results are displayed)  Labs Reviewed - No data to display ____________________________________________  EKG   ____________________________________________  RADIOLOGY  No acute fractures or findings on the x-rays of the foot and ankle ____________________________________________   PROCEDURES  Procedure(s) performed:   Procedures  Critical Care performed:   ____________________________________________   INITIAL IMPRESSION / ASSESSMENT AND PLAN / ED COURSE  Pertinent labs & imaging results that were available during my care of the patient were reviewed by me and considered in my medical decision making (see chart for details).  DDX: Foot fracture, ankle fracture, ligamentous injury, contusion  As part of my medical decision making, I reviewed the following data within the electronic MEDICAL RECORD NUMBER Notes from prior ED visits  ----------------------------------------- 5:01 AM on 10/23/2017 -----------------------------------------  Radiographs reassuring.  Patient does not want any pain meds at this time.  We will Ace wrap the site of the injury.  We discussed raising the injury as well as icing it and maintain the Ace wrap.  The patient will be discharged home and will follow  up with primary care.      ____________________________________________   FINAL CLINICAL IMPRESSION(S) / ED DIAGNOSES  Ankle sprain.    NEW MEDICATIONS STARTED DURING THIS VISIT:  New Prescriptions   No medications on file     Note:  This document was prepared using Dragon voice recognition software and may include unintentional dictation errors.     Orbie Pyo, MD 10/23/17 [phone]

## 2017-10-23 NOTE — ED Triage Notes (Signed)
Patient was at work on stairs tonight and missed a step and felt her left ankle hit the next step hard.  Pt is reporting 6/10 "throbbing" pain.  Pt denies feeling or hearing any pop or sounds from her ankle when it was injured.  Pt AOx4.

## 2017-10-23 NOTE — ED Notes (Signed)
Reviewed d/c instructions, follow-up care, use of ice/elevation, OTC pain medications, ace-bandage use with patient. Patient verbalized understanding.

## 2017-10-26 ENCOUNTER — Encounter: Payer: Self-pay | Admitting: Family Medicine

## 2017-10-26 ENCOUNTER — Ambulatory Visit (INDEPENDENT_AMBULATORY_CARE_PROVIDER_SITE_OTHER): Payer: 59 | Admitting: Family Medicine

## 2017-10-26 ENCOUNTER — Ambulatory Visit: Payer: Self-pay | Admitting: Family Medicine

## 2017-10-26 VITALS — BP 158/100 | HR 107 | Temp 98.5°F | Ht 66.5 in | Wt 284.5 lb

## 2017-10-26 DIAGNOSIS — Z23 Encounter for immunization: Secondary | ICD-10-CM

## 2017-10-26 DIAGNOSIS — S93402D Sprain of unspecified ligament of left ankle, subsequent encounter: Secondary | ICD-10-CM

## 2017-10-26 DIAGNOSIS — IMO0001 Reserved for inherently not codable concepts without codable children: Secondary | ICD-10-CM

## 2017-10-26 DIAGNOSIS — F321 Major depressive disorder, single episode, moderate: Secondary | ICD-10-CM

## 2017-10-26 MED ORDER — VENLAFAXINE HCL ER 150 MG PO CP24: 150.0000 mg | ORAL_CAPSULE | Freq: Every day | ORAL | 3 refills | Status: DC

## 2017-10-26 NOTE — Assessment & Plan Note (Signed)
RICE.. Lace up brace and start home PT.

## 2017-10-26 NOTE — Assessment & Plan Note (Signed)
Minimal improvement..  Increase venlafaxine and continue counseling. Follow up in 4 weeks.

## 2017-10-26 NOTE — Addendum Note (Signed)
Addended by: Elmon Kirschner A on: 10/26/2017 11:21 AM   Modules accepted: Orders

## 2017-10-26 NOTE — Progress Notes (Signed)
Subjective:    Patient ID: [name], female    DOB: [birth year], 26 y.o.   MRN: [id number]  HPI   26 year old female presents with new onset ankle pain as well as follow up mood.   MDD, moderate, poor control on venlafaxine 75 mg in last 3 weeks.  She reports  Minimal improvement in mood.   She has not had  any SE to medication that have been persistent.  She is going to counseling every 6 weeks.   Depression screen [facility] 2/9 10/26/2017 08/31/2017 07/23/2017  Decreased Interest 2 2 2   Down, Depressed, Hopeless 1 1 1   PHQ - 2 Score 3 3 3   Altered sleeping 2 2 2   Tired, decreased energy 2 2 2   Change in appetite 2 2 2   Feeling bad or failure about yourself  0 1 1  Trouble concentrating 2 2 2   Moving slowly or fidgety/restless 2 1 1   Suicidal thoughts 0 0 0  PHQ-9 Score 13 13 13   Difficult doing work/chores Very difficult Somewhat difficult -     4 days ago fell after missteping at work,  Twisted ankle with foot medially.  Went to ER.[facility] X-ray negative.  Able to weight bear after the injury.  Pain in left lateral foot. Has noted swelling, no redness. No contusion.  Wearing  ace bandage, ice and tylenol.  Decreased mobility but pain improved some.    Review of Systems  Constitutional: Negative for fatigue and fever.  HENT: Negative for congestion.   Eyes: Negative for pain.  Respiratory: Negative for cough and shortness of breath.   Cardiovascular: Negative for chest pain, palpitations and leg swelling.  Gastrointestinal: Negative for abdominal pain.  Genitourinary: Negative for dysuria and vaginal bleeding.  Musculoskeletal: Negative for back pain.  Neurological: Negative for syncope, light-headedness and headaches.  Psychiatric/Behavioral: Negative for dysphoric mood.       Objective:   Physical Exam  Constitutional: Vital signs are normal. She appears well-developed and well-nourished. She is cooperative.  Non-toxic appearance. She does not appear ill. No distress.   HENT:  Head: Normocephalic.  Right Ear: Hearing, tympanic membrane, external ear and ear canal normal. Tympanic membrane is not erythematous, not retracted and not bulging.  Left Ear: Hearing, tympanic membrane, external ear and ear canal normal. Tympanic membrane is not erythematous, not retracted and not bulging.  Nose: No mucosal edema or rhinorrhea. Right sinus exhibits no maxillary sinus tenderness and no frontal sinus tenderness. Left sinus exhibits no maxillary sinus tenderness and no frontal sinus tenderness.  Mouth/Throat: Uvula is midline, oropharynx is clear and moist and mucous membranes are normal.  Eyes: Pupils are equal, round, and reactive to light. Conjunctivae, EOM and lids are normal. Lids are everted and swept, no foreign bodies found.  Neck: Trachea normal and normal range of motion. Neck supple. Carotid bruit is not present. No thyroid mass and no thyromegaly present.  Cardiovascular: Normal rate, regular rhythm, S1 normal, S2 normal, normal heart sounds, intact distal pulses and normal pulses.  Exam reveals no gallop and no friction rub.   No murmur heard. Pulmonary/Chest: Effort normal and breath sounds normal. No tachypnea. No respiratory distress. She has no decreased breath sounds. She has no wheezes. She has no rhonchi. She has no rales.  Abdominal: Soft. Normal appearance and bowel sounds are normal. There is no tenderness.  Musculoskeletal:       Left ankle: She exhibits decreased range of motion and swelling. She exhibits no ecchymosis. Tenderness.  Lateral malleolus tenderness found. No medial malleolus, no AITFL, no CF ligament, no posterior TFL, no head of 5th metatarsal and no proximal fibula tenderness found. Achilles tendon normal.  Neurological: She is alert.  Skin: Skin is warm, dry and intact. No rash noted.  Psychiatric: Her speech is normal. Judgment and thought content normal. Her mood appears not anxious. Her affect is blunt. She is withdrawn. Cognition  and memory are normal. She does not exhibit a depressed mood.          Assessment & Plan:

## 2017-10-26 NOTE — Patient Instructions (Addendum)
Increase venlafaxine to 150 mg daily.  Continue counseling.   Ankle Sprain An ankle sprain is a stretch or tear in one of the tough, fiber-like tissues (ligaments) in the ankle. The ligaments in your ankle help to hold the bones of the ankle together. What are the causes? This condition is often caused by stepping on or falling on the outer edge of the foot. What increases the risk? This condition is more likely to develop in people who play sports. What are the signs or symptoms? Symptoms of this condition include:  Pain in your ankle.  Swelling.  Bruising. Bruising may develop right after you sprain your ankle or 1-2 days later.  Trouble standing or walking, especially when you turn or change directions.  How is this diagnosed? This condition is diagnosed with a physical exam. During the exam, your health care provider will press on certain parts of your foot and ankle and try to move them in certain ways. X-rays may be taken to see how severe the sprain is and to check for broken bones. How is this treated? This condition may be treated with:  A brace. This is used to keep the ankle from moving until it heals.  An elastic bandage. This is used to support the ankle.  Crutches.  Pain medicine.  Surgery. This may be needed if the sprain is severe.  Physical therapy. This may help to improve the range of motion in the ankle.  Follow these instructions at home:  Rest your ankle.  Take over-the-counter and prescription medicines only as told by your health care provider.  For 2-3 days, keep your ankle raised (elevated) above the level of your heart as much as possible.  If directed, apply ice to the area: ? Put ice in a plastic bag. ? Place a towel between your skin and the bag. ? Leave the ice on for 20 minutes, 2-3 times a day.  If you were given a brace: ? Wear it as directed. ? Remove it to shower or bathe. ? Try not to move your ankle much, but wiggle your  toes from time to time. This helps to prevent swelling.  If you were given an elastic bandage (dressing): ? Remove it to shower or bathe. ? Try not to move your ankle much, but wiggle your toes from time to time. This helps to prevent swelling. ? Adjust the dressing to make it more comfortable if it feels too tight. ? Loosen the dressing if you have numbness or tingling in your foot, or if your foot becomes cold and blue.  If you have crutches, use them as told by your health care provider. Continue to use them until you can walk without feeling pain in your ankle. Contact a health care provider if:  You have rapidly increasing bruising or swelling.  Your pain is not relieved with medicine. Get help right away if:  Your toes or foot becomes numb or blue.  You have severe pain that gets worse. This information is not intended to replace advice given to you by your health care provider. Make sure you discuss any questions you have with your health care provider. Document Released: 12/14/2005 Document Revised: 04/22/2016 Document Reviewed: 07/16/2015 Elsevier Interactive Patient Education  2017 [employer].

## 2017-12-07 ENCOUNTER — Ambulatory Visit: Payer: Self-pay | Admitting: Family Medicine

## 2017-12-31 ENCOUNTER — Encounter: Payer: Self-pay | Admitting: Family Medicine

## 2017-12-31 ENCOUNTER — Other Ambulatory Visit: Payer: Self-pay

## 2017-12-31 ENCOUNTER — Ambulatory Visit: Payer: BLUE CROSS/BLUE SHIELD | Admitting: Family Medicine

## 2017-12-31 VITALS — BP 120/90 | HR 97 | Temp 98.5°F | Ht 66.5 in | Wt 286.8 lb

## 2017-12-31 DIAGNOSIS — F321 Major depressive disorder, single episode, moderate: Secondary | ICD-10-CM | POA: Diagnosis not present

## 2017-12-31 DIAGNOSIS — J040 Acute laryngitis: Secondary | ICD-10-CM

## 2017-12-31 MED ORDER — VENLAFAXINE HCL ER 75 MG PO CP24: 75.0000 mg | ORAL_CAPSULE | Freq: Every day | ORAL | 0 refills | Status: DC

## 2017-12-31 MED ORDER — SERTRALINE HCL 50 MG PO TABS: 50.0000 mg | ORAL_TABLET | Freq: Every day | ORAL | 3 refills | Status: DC

## 2017-12-31 NOTE — Assessment & Plan Note (Signed)
Inadequate control. Continue counseling and transition over to sertraline. Follow up in 4 weeks.

## 2017-12-31 NOTE — Assessment & Plan Note (Signed)
Likely viral.. Symptomatic care.

## 2017-12-31 NOTE — Progress Notes (Signed)
   Subjective:    Patient ID: [name], female    DOB: 26-Jun-1991, 27 y.o.   MRN: [id number]  HPI    27 year old female presents for  re-eval of depression as well as new hoarse voice.   Major moderate depression:  Started on  venlafxine in 06/2017, increased dose To 150 mg daily on 10/30. Today she reports  Minimal improvement on higher dose. PHQ9: 13  going to counseling every 6 weeks.   She has noted  hoarse voice and loss of voice in last 3 days.  Some nasal congestion, chest congestion.  no SOB, no wheeze, no fever.  No ear pain, no face pain.  She has tried mucinex     Review of Systems  Constitutional: Negative for fatigue and fever.  HENT: Negative for ear pain.   Eyes: Negative for pain.  Respiratory: Negative for chest tightness and shortness of breath.   Cardiovascular: Negative for chest pain, palpitations and leg swelling.  Gastrointestinal: Negative for abdominal pain.  Genitourinary: Negative for dysuria.       Objective:   Physical Exam  Constitutional: Vital signs are normal. She appears well-developed and well-nourished. She is cooperative.  Non-toxic appearance. She does not appear ill. No distress.  HENT:  Head: Normocephalic.  Right Ear: Hearing, tympanic membrane, external ear and ear canal normal. Tympanic membrane is not erythematous, not retracted and not bulging.  Left Ear: Hearing, tympanic membrane, external ear and ear canal normal. Tympanic membrane is not erythematous, not retracted and not bulging.  Nose: Mucosal edema and rhinorrhea present. Right sinus exhibits no maxillary sinus tenderness and no frontal sinus tenderness. Left sinus exhibits no maxillary sinus tenderness and no frontal sinus tenderness.  Mouth/Throat: Uvula is midline, oropharynx is clear and moist and mucous membranes are normal.  Eyes: Conjunctivae, EOM and lids are normal. Pupils are equal, round, and reactive to light. Lids are everted and swept, no foreign bodies  found.  Neck: Trachea normal and normal range of motion. Neck supple. Carotid bruit is not present. No thyroid mass and no thyromegaly present.  Cardiovascular: Normal rate, regular rhythm, S1 normal, S2 normal, normal heart sounds, intact distal pulses and normal pulses. Exam reveals no gallop and no friction rub.  No murmur heard. Pulmonary/Chest: Effort normal and breath sounds normal. No tachypnea. No respiratory distress. She has no decreased breath sounds. She has no wheezes. She has no rhonchi. She has no rales.  Neurological: She is alert.  Skin: Skin is warm, dry and intact. No rash noted.  Psychiatric: Her speech is normal and behavior is normal. Judgment normal. Her mood appears not anxious. Cognition and memory are normal. She does not exhibit a depressed mood. She expresses no homicidal and no suicidal ideation.          Assessment & Plan:

## 2017-12-31 NOTE — Patient Instructions (Addendum)
Decrease back down to 75 mg po daily x 1-2 weeks then  Switch to sertraline 50 mg daily.   Work on low [employer], decreased stress, increase fiber and water and can consider FODMAP diet for possible IBS.   Laryngitis Laryngitis is inflammation of your vocal cords. This causes hoarseness, coughing, loss of voice, sore throat, or a dry throat. Your vocal cords are two bands of muscles that are found in your throat. When you speak, these cords come together and vibrate. These vibrations come out through your mouth as sound. When your vocal cords are inflamed, your voice sounds different. Laryngitis can be temporary (acute) or long-term (chronic). Most cases of acute laryngitis improve with time. Chronic laryngitis is laryngitis that lasts for more than three weeks. What are the causes? Acute laryngitis may be caused by:  A viral infection.  Lots of talking, yelling, or singing. This is also called vocal strain.  Bacterial infections.  Chronic laryngitis may be caused by:  Vocal strain.  Injury to your vocal cords.  Acid reflux (gastroesophageal reflux disease or GERD).  Allergies.  Sinus infection.  Smoking.  Alcohol abuse.  Breathing in chemicals or dust.  Growths on the vocal cords.  What increases the risk? Risk factors for laryngitis include:  Smoking.  Alcohol abuse.  Having allergies.  What are the signs or symptoms? Symptoms of laryngitis may include:  Low, hoarse voice.  Loss of voice.  Dry cough.  Sore throat.  Stuffy nose.  How is this diagnosed? Laryngitis may be diagnosed by:  Physical exam.  Throat culture.  Blood test.  Laryngoscopy. This procedure allows your health care provider to look at your vocal cords with a mirror or viewing tube.  How is this treated? Treatment for laryngitis depends on what is causing it. Usually, treatment involves resting your voice and using medicines to soothe your throat. However, if your laryngitis is  caused by a bacterial infection, you may need to take antibiotic medicine. If your laryngitis is caused by a growth, you may need to have a procedure to remove it. Follow these instructions at home:  Drink enough fluid to keep your urine clear or pale yellow.  Breathe in moist air. Use a humidifier if you live in a dry climate.  Take medicines only as directed by your health care provider.  If you were prescribed an antibiotic medicine, finish it all even if you start to feel better.  Do not smoke cigarettes or electronic cigarettes. If you need help quitting, ask your health care provider.  Talk as little as possible. Also avoid whispering, which can cause vocal strain.  Write instead of talking. Do this until your voice is back to normal. Contact a health care provider if:  You have a fever.  You have increasing pain.  You have difficulty swallowing. Get help right away if:  You cough up blood.  You have trouble breathing. This information is not intended to replace advice given to you by your health care provider. Make sure you discuss any questions you have with your health care provider. Document Released: 12/14/2005 Document Revised: 05/21/2016 Document Reviewed: 05/29/2014 Elsevier Interactive Patient Education  [employer].

## 2018-01-28 ENCOUNTER — Ambulatory Visit (INDEPENDENT_AMBULATORY_CARE_PROVIDER_SITE_OTHER): Payer: BLUE CROSS/BLUE SHIELD | Admitting: Family Medicine

## 2018-01-28 ENCOUNTER — Other Ambulatory Visit: Payer: Self-pay

## 2018-01-28 ENCOUNTER — Encounter: Payer: Self-pay | Admitting: Family Medicine

## 2018-01-28 DIAGNOSIS — K580 Irritable bowel syndrome with diarrhea: Secondary | ICD-10-CM

## 2018-01-28 DIAGNOSIS — F321 Major depressive disorder, single episode, moderate: Secondary | ICD-10-CM | POA: Diagnosis not present

## 2018-01-28 DIAGNOSIS — K589 Irritable bowel syndrome without diarrhea: Secondary | ICD-10-CM | POA: Insufficient documentation

## 2018-01-28 NOTE — Progress Notes (Signed)
Subjective:    Patient ID: [name], female    DOB: 01-17-1991, 27 y.o.   MRN: [id number]  HPI    27 year old female presents for follow up on  Depression.   At last OV.. Given inadequate response to venlafaxine.  Changed to sertraline.   She reports  she has been feeling much better.  She feels 40% better overall.   She is sleeping better. More motivation.  PHQ9 down to 4 from 13. No SE to the medication.  She has some IBS.Marland Kitchen Has occ cramping, diarrhea.. Lasted few hours.  No fever.  Now feeling better.  Depression screen [facility] 2/9 12/31/2017 12/31/2017 10/26/2017 08/31/2017 07/23/2017  Decreased Interest 2 2 2 2 2   Down, Depressed, Hopeless 1 1 1 1 1   PHQ - 2 Score 3 3 3 3 3   Altered sleeping 2 2 2 2 2   Tired, decreased energy 1 1 2 2 2   Change in appetite 1 1 2 2 2   Feeling bad or failure about yourself  2 2 0 1 1  Trouble concentrating 2 2 2 2 2   Moving slowly or fidgety/restless 2 2 2 1 1   Suicidal thoughts 0 0 0 0 0  PHQ-9 Score 13 13 13 13 13   Difficult doing work/chores Very difficult - Very difficult Somewhat difficult -       Review of Systems  Constitutional: Negative for fatigue and fever.  HENT: Negative for ear pain.   Eyes: Negative for pain.  Respiratory: Negative for chest tightness and shortness of breath.   Cardiovascular: Negative for chest pain, palpitations and leg swelling.  Gastrointestinal: Negative for abdominal pain.  Genitourinary: Negative for dysuria.       Objective:   Physical Exam  Constitutional: Vital signs are normal. She appears well-developed and well-nourished. She is cooperative.  Non-toxic appearance. She does not appear ill. No distress.  obese  HENT:  Head: Normocephalic.  Right Ear: Hearing, tympanic membrane, external ear and ear canal normal. Tympanic membrane is not erythematous, not retracted and not bulging.  Left Ear: Hearing, tympanic membrane, external ear and ear canal normal. Tympanic membrane is not  erythematous, not retracted and not bulging.  Nose: No mucosal edema or rhinorrhea. Right sinus exhibits no maxillary sinus tenderness and no frontal sinus tenderness. Left sinus exhibits no maxillary sinus tenderness and no frontal sinus tenderness.  Mouth/Throat: Uvula is midline, oropharynx is clear and moist and mucous membranes are normal.  Eyes: Conjunctivae, EOM and lids are normal. Pupils are equal, round, and reactive to light. Lids are everted and swept, no foreign bodies found.  Neck: Trachea normal and normal range of motion. Neck supple. Carotid bruit is not present. No thyroid mass and no thyromegaly present.  Cardiovascular: Normal rate, regular rhythm, S1 normal, S2 normal, normal heart sounds, intact distal pulses and normal pulses. Exam reveals no gallop and no friction rub.  No murmur heard. Pulmonary/Chest: Effort normal and breath sounds normal. No tachypnea. No respiratory distress. She has no decreased breath sounds. She has no wheezes. She has no rhonchi. She has no rales.  Abdominal: Soft. Normal appearance and bowel sounds are normal. There is no tenderness.  Neurological: She is alert.  Skin: Skin is warm, dry and intact. No rash noted.  Psychiatric: Her speech is normal and behavior is normal. Judgment and thought content normal. Her mood appears not anxious. Cognition and memory are normal. She does not exhibit a depressed mood.  Assessment & Plan:

## 2018-01-28 NOTE — Patient Instructions (Addendum)
Continue sertraline at current dose.  Call if IBS symptoms are not improving as expected.  Work on [employer], increase water and fiber.

## 2018-01-28 NOTE — Assessment & Plan Note (Signed)
Increase fiber and water and avoid greasy foods.

## 2018-01-28 NOTE — Assessment & Plan Note (Signed)
Significant improvement with sertraline.  Doubt GI SE.

## 2018-01-29 IMAGING — CR DG ANKLE COMPLETE 3+V*L*
3 series · 3 of 3 positions shown · non-contrast
Comparison: None.

CLINICAL DATA: Fell down steps at work tonight.

EXAM:
LEFT ANKLE COMPLETE - 3+ VIEW

[ankle ap]
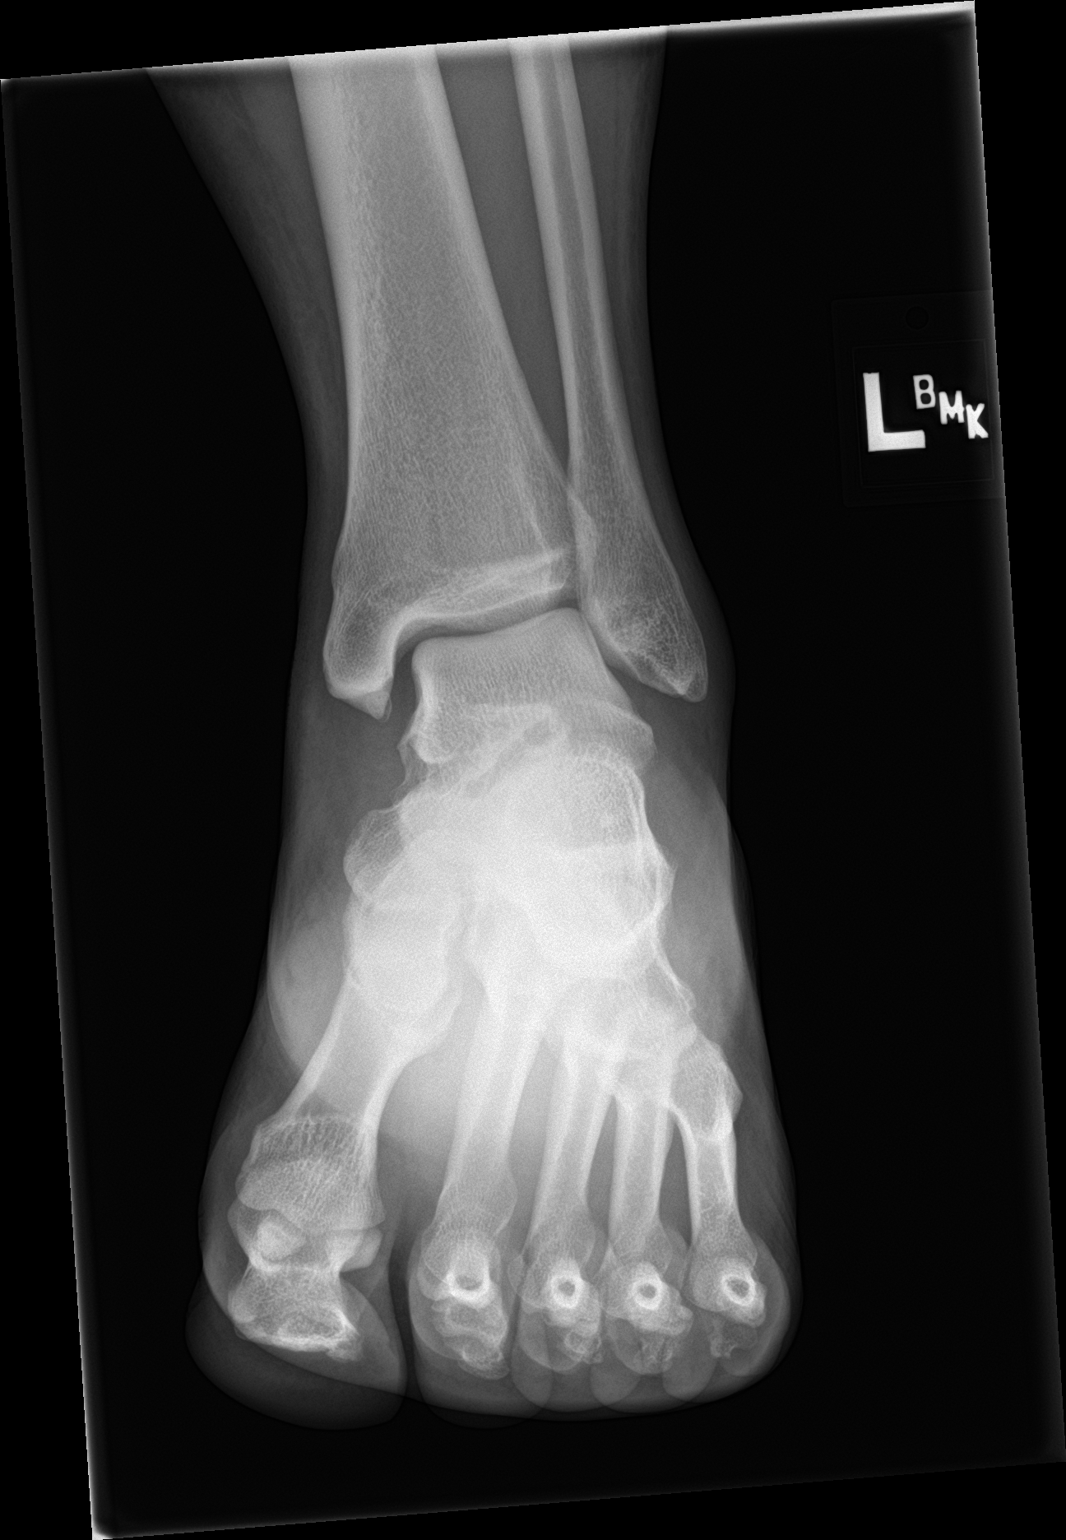

[ankle obl]
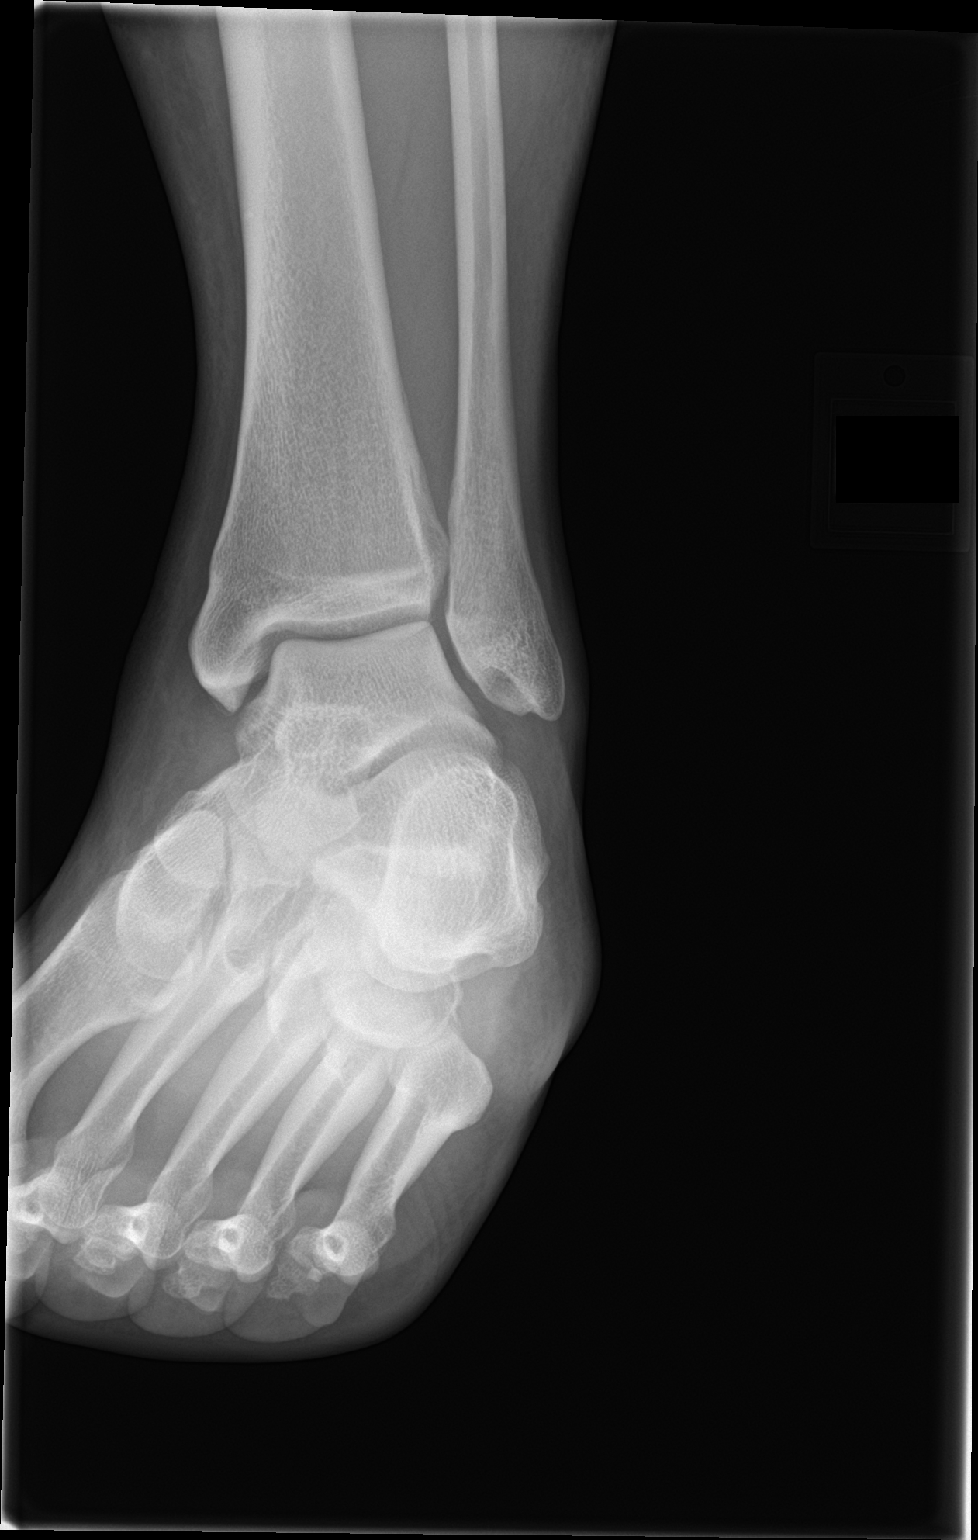

[ankle lat]
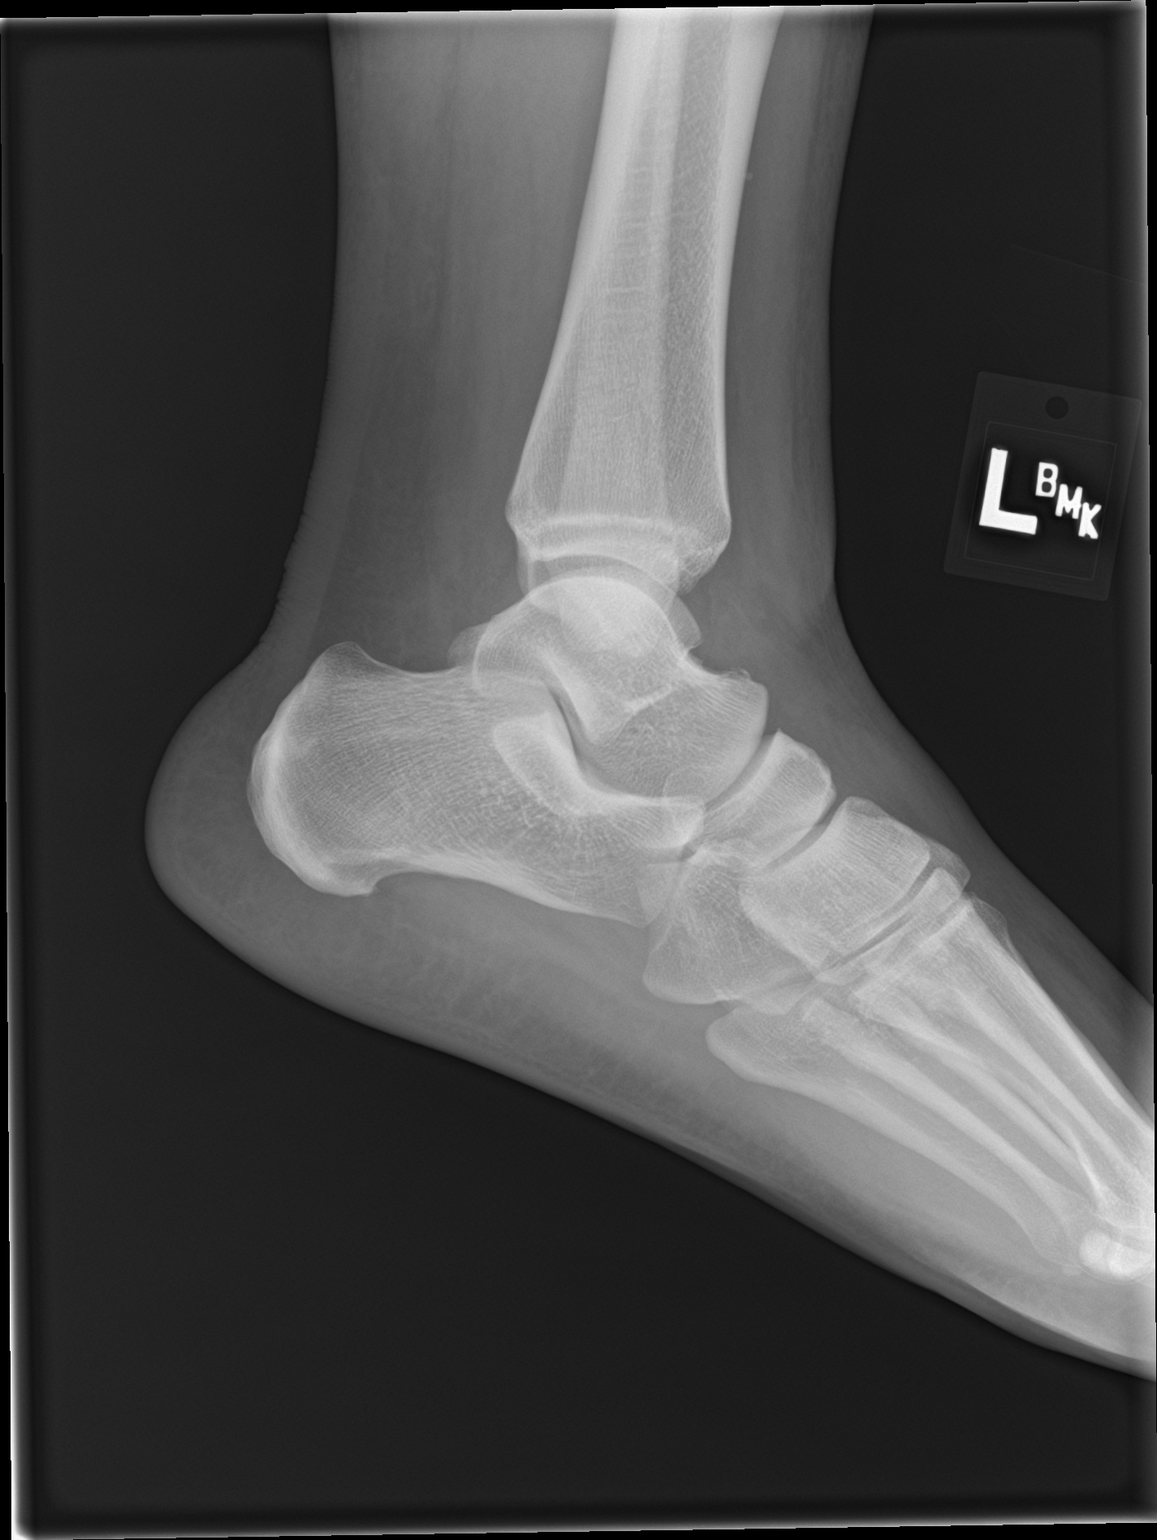

[3 of 3 positions shown; findings below may reference images not displayed]

FINDINGS: No fracture deformity nor dislocation. Small medial malleolus
accessory os. The ankle mortise appears congruent and the
tibiofibular syndesmosis intact. No destructive bony lesions. Soft
tissue planes are non-suspicious.
IMPRESSION: Negative.

## 2018-02-08 ENCOUNTER — Other Ambulatory Visit: Payer: Self-pay | Admitting: Family Medicine

## 2018-02-09 NOTE — Telephone Encounter (Signed)
Last office visit 01/28/2018.  Last refilled 08/25/2017 for #30 with no refills.  Ok to refill?

## 2018-02-11 MED ORDER — CYCLOBENZAPRINE HCL 5 MG PO TABS: 5.0000 mg | ORAL_TABLET | Freq: Every day | ORAL | 0 refills | Status: DC

## 2018-03-08 ENCOUNTER — Other Ambulatory Visit: Payer: Self-pay | Admitting: *Deleted

## 2018-03-08 MED ORDER — SERTRALINE HCL 50 MG PO TABS: 50.0000 mg | ORAL_TABLET | Freq: Every day | ORAL | 1 refills | Status: DC

## 2018-03-16 DIAGNOSIS — F331 Major depressive disorder, recurrent, moderate: Secondary | ICD-10-CM | POA: Diagnosis not present

## 2018-03-22 ENCOUNTER — Encounter: Payer: Self-pay | Admitting: Family Medicine

## 2018-03-25 ENCOUNTER — Ambulatory Visit: Payer: BLUE CROSS/BLUE SHIELD | Admitting: Family Medicine

## 2018-03-25 ENCOUNTER — Other Ambulatory Visit: Payer: Self-pay

## 2018-03-25 ENCOUNTER — Encounter: Payer: Self-pay | Admitting: Family Medicine

## 2018-03-25 VITALS — BP 110/78 | HR 87 | Temp 98.5°F | Ht 66.5 in | Wt 294.5 lb

## 2018-03-25 DIAGNOSIS — G8929 Other chronic pain: Secondary | ICD-10-CM | POA: Diagnosis not present

## 2018-03-25 DIAGNOSIS — N926 Irregular menstruation, unspecified: Secondary | ICD-10-CM | POA: Diagnosis not present

## 2018-03-25 DIAGNOSIS — M549 Dorsalgia, unspecified: Secondary | ICD-10-CM | POA: Diagnosis not present

## 2018-03-25 DIAGNOSIS — E282 Polycystic ovarian syndrome: Secondary | ICD-10-CM | POA: Diagnosis not present

## 2018-03-25 LAB — POCT URINE PREGNANCY: Preg Test, Ur: NEGATIVE

## 2018-03-25 MED ORDER — CYCLOBENZAPRINE HCL 10 MG PO TABS: 5.0000 mg | ORAL_TABLET | Freq: Every day | ORAL | 1 refills | Status: DC

## 2018-03-25 MED ORDER — MELOXICAM 15 MG PO TABS: 15.0000 mg | ORAL_TABLET | Freq: Every day | ORAL | 0 refills | Status: DC

## 2018-03-25 NOTE — Patient Instructions (Addendum)
Stop ibuprofen.  Increase muscle relaxant to 10 mg daily.  Start meloxicam 15 mg daily.  Work home stretching. Massage, heat.  Work on weight loss.

## 2018-03-25 NOTE — Progress Notes (Signed)
Subjective:    Patient ID: [name], female    DOB: 1991/11/17, 27 y.o.   MRN: [id number]  HPI    27 year old  Morbidly obese female with chronic upper back pain pain presents for acute severe worsening of back pain.  Worse in last several week.Marland KitchenMarland KitchenNo fall , no change in activity. Pain is in between shoulder blades in upper back. no new numbness, no weakness.  no fever. Pain improved with stretching arms. Using ibuprofen 800 mg or tylenol 650 mg .. Does not help.  No movement that makes pain worse.  Cyclobenzaprine helps minimmly now.   Hx of thoracic and cervical X-ray 2016: Only significant finding. There appears to be a extremely mild anterior osteophyte forming along the  anterior, inferior aspect of T2 vertebral body   Has also noted light menses and somet time skipping menses x 8 months.  Hx of PCOS.  on patches.  Wt Readings from Last 3 Encounters:  03/25/18 294 lb 8 oz (133.6 kg)  01/28/18 290 lb (131.5 kg)  12/31/17 286 lb 12 oz (130.1 kg)    Review of Systems  Constitutional: Negative for fatigue and fever.  HENT: Negative for congestion.   Eyes: Negative for pain.  Respiratory: Negative for cough and shortness of breath.   Cardiovascular: Negative for chest pain, palpitations and leg swelling.  Gastrointestinal: Negative for abdominal pain.  Genitourinary: Negative for dysuria and vaginal bleeding.  Musculoskeletal: Negative for back pain.  Neurological: Negative for syncope, light-headedness and headaches.  Psychiatric/Behavioral: Negative for dysphoric mood.       Objective:   Physical Exam  Constitutional: She is oriented to person, place, and time. Vital signs are normal. She appears well-developed and well-nourished. She is cooperative.  Non-toxic appearance. She does not appear ill. No distress.  HENT:  Head: Normocephalic.  Right Ear: Hearing, tympanic membrane, external ear and ear canal normal. Tympanic membrane is not erythematous, not  retracted and not bulging.  Left Ear: Hearing, tympanic membrane, external ear and ear canal normal. Tympanic membrane is not erythematous, not retracted and not bulging.  Nose: No mucosal edema or rhinorrhea. Right sinus exhibits no maxillary sinus tenderness and no frontal sinus tenderness. Left sinus exhibits no maxillary sinus tenderness and no frontal sinus tenderness.  Mouth/Throat: Uvula is midline, oropharynx is clear and moist and mucous membranes are normal.  Eyes: Pupils are equal, round, and reactive to light. Conjunctivae, EOM and lids are normal. Lids are everted and swept, no foreign bodies found.  Neck: Trachea normal and normal range of motion. Neck supple. Carotid bruit is not present. No thyroid mass and no thyromegaly present.  Cardiovascular: Normal rate, regular rhythm, S1 normal, S2 normal, normal heart sounds, intact distal pulses and normal pulses. Exam reveals no gallop and no friction rub.  No murmur heard. Pulmonary/Chest: Effort normal and breath sounds normal. No tachypnea. No respiratory distress. She has no decreased breath sounds. She has no wheezes. She has no rhonchi. She has no rales.  Abdominal: Soft. Normal appearance and bowel sounds are normal. There is no tenderness.  Musculoskeletal:       Cervical back: Normal. She exhibits normal range of motion and no tenderness.       Thoracic back: She exhibits tenderness and bony tenderness. She exhibits normal range of motion and no swelling.  Neg spurling  Neurological: She is alert and oriented to person, place, and time. She has normal strength. No cranial nerve deficit or sensory deficit. Gait normal.  Skin: Skin is warm, dry and intact. No rash noted.  Psychiatric: Her speech is normal and behavior is normal. Judgment and thought content normal. Her mood appears not anxious. Cognition and memory are normal. She does not exhibit a depressed mood.          Assessment & Plan:

## 2018-03-25 NOTE — Assessment & Plan Note (Signed)
Refer to nutritionist as breast size and weight are putting direct strain on her thoracic spine

## 2018-03-25 NOTE — Assessment & Plan Note (Signed)
With acute flare. Will treat with increased muscle relaxant dose and change to meloxicam. If not improving in 2 week consider additional imaging.  Start heat, massage, and gentle stretching.

## 2018-03-25 NOTE — Assessment & Plan Note (Signed)
Likely cause of irregular menses. On OCP high dose estrogen. Recommended weight loss.. Referred to nutritionist.

## 2018-03-31 ENCOUNTER — Ambulatory Visit: Payer: BLUE CROSS/BLUE SHIELD | Admitting: Family Medicine

## 2018-04-13 DIAGNOSIS — F331 Major depressive disorder, recurrent, moderate: Secondary | ICD-10-CM | POA: Diagnosis not present

## 2018-04-22 ENCOUNTER — Ambulatory Visit: Payer: Self-pay | Admitting: Dietician

## 2018-04-23 ENCOUNTER — Other Ambulatory Visit: Payer: Self-pay | Admitting: Family Medicine

## 2018-04-24 NOTE — Telephone Encounter (Signed)
Last office visit 03/25/18.  Last refilled 03/25/18 for #30 with no refills.  Ok to refill?

## 2018-04-25 ENCOUNTER — Encounter: Payer: Self-pay | Admitting: Dietician

## 2018-04-25 ENCOUNTER — Encounter: Payer: BLUE CROSS/BLUE SHIELD | Attending: Family Medicine | Admitting: Dietician

## 2018-04-25 VITALS — Ht 67.25 in | Wt 297.6 lb

## 2018-04-25 DIAGNOSIS — Z6841 Body Mass Index (BMI) 40.0 and over, adult: Secondary | ICD-10-CM

## 2018-04-25 NOTE — Progress Notes (Signed)
Medical Nutrition Therapy: Visit start time: 1400  end time: 1515  Assessment:  Diagnosis: obesity Past medical history: IBS Psychosocial issues/ stress concerns: history of depression, some anxiety per patient Preferred learning method:  [name] . Hands-on  Current weight: 297.6lbs Height: 5'7.25" Medications, supplements: reconciled list in medical record  Progress and evaluation: Patient reports limiting gluten and dairy due to IBS. Has FODMAP diet and is following to some extent. Tries to avoid caffeine. Reports weight tends to flucturate within 285-295, was startled by weight measurement of 310 recently. Tried grapefruit diet (with direction from stepmother) in the past, and lost some weight, but did not like the diet. Sometimes craves sweets and would like help with ideas for healthier alternatives.    Physical activity: no regular exercise, trying to increase general daily activity and spends most of workday standing/ walking.   Dietary Intake:  Usual eating pattern includes 3 meals and 0-1 snacks per day. Dining out frequency: 3-4 meals per week.  Breakfast: 2pm sandwiches, cereal breakfast/ lunch foods Snack: none Lunch: 7pm meat rice and veg; sandwich; burger; variety Snack: none Supper: 12-1am-- cereal, fruit, yogurt  Snack: none Beverages: mostly water; occasional tea or sprite on weekends  Nutrition Care Education: Topics covered: weight loss Basic nutrition: basic food groups, appropriate nutrient balance, appropriate meal and snack schedule, general nutrition guidelines    Weight control: benefits of weight control, low fat and low sugar food choices, strategies for portion control, increasing portions of vegetables and fruits, meal and snack options, benefits of tracking food intake, role of exercise.  Other: FODMAP -- mostly gluten free and lactose free food choices per patient's current dietary restrictions/ goals.   Nutritional Diagnosis:  [place]-3.3  Overweight/obesity As related to excess calories and inactivity.  As evidenced by patient with BMI of 46.26.  Intervention: Instruction as noted above.   Set goals with direction from patient.   Patient has been making some changes, especially to manage IBS symptoms, and has had some weight loss.   Education Materials given:  . Plate Planner with food lists . Sample menus . Snacking handout . Goals/ instructions   Learner/ who was taught:  . Patient    Level of understanding: Marland Kitchen Verbalizes/ demonstrates competency  Demonstrated degree of understanding via:   Teach back Learning barriers: . None  Willingness to learn/ readiness for change: . Eager, change in progress   Monitoring and Evaluation:  Dietary intake, exercise, and body weight      follow up: 06/06/18

## 2018-04-25 NOTE — Patient Instructions (Addendum)
   Try having graham crackers with peanut butter for an after-work sweet treat.   Control portions of starchy foods with meals to 1 cup (fist-size) or less.   Prepare meals at home more often to limit restaurant meals. Use menus for meal ideas.

## 2018-05-11 DIAGNOSIS — F331 Major depressive disorder, recurrent, moderate: Secondary | ICD-10-CM | POA: Diagnosis not present

## 2018-05-12 ENCOUNTER — Encounter (INDEPENDENT_AMBULATORY_CARE_PROVIDER_SITE_OTHER): Payer: Self-pay

## 2018-05-31 ENCOUNTER — Encounter: Payer: BLUE CROSS/BLUE SHIELD | Admitting: Family Medicine

## 2018-05-31 ENCOUNTER — Other Ambulatory Visit: Payer: Self-pay | Admitting: Family Medicine

## 2018-05-31 DIAGNOSIS — Z0289 Encounter for other administrative examinations: Secondary | ICD-10-CM

## 2018-05-31 NOTE — Telephone Encounter (Signed)
Last office visit 03/25/2018.  Last refilled 04/26/2018 for #30 with no refills. Ok to refill?

## 2018-06-06 ENCOUNTER — Ambulatory Visit: Payer: Self-pay | Admitting: Dietician

## 2018-07-11 DIAGNOSIS — F331 Major depressive disorder, recurrent, moderate: Secondary | ICD-10-CM | POA: Diagnosis not present

## 2018-07-19 ENCOUNTER — Ambulatory Visit (INDEPENDENT_AMBULATORY_CARE_PROVIDER_SITE_OTHER): Payer: BLUE CROSS/BLUE SHIELD | Admitting: Family Medicine

## 2018-07-19 ENCOUNTER — Encounter: Payer: Self-pay | Admitting: Family Medicine

## 2018-07-19 DIAGNOSIS — F321 Major depressive disorder, single episode, moderate: Secondary | ICD-10-CM | POA: Diagnosis not present

## 2018-07-19 NOTE — Patient Instructions (Signed)
Increase sertraline to 100 mg at bedtime ( by using up 50 mg with 2 a day).

## 2018-07-19 NOTE — Assessment & Plan Note (Signed)
Inadequate control. Continue counseling and increase sertraline to 100 mg at bedtime.

## 2018-07-19 NOTE — Progress Notes (Signed)
   Subjective:    Patient ID: [name], female    DOB: May 29, 1991, 27 y.o.   MRN: [id number]  HPI   27 year old female presents for discussion of medication change.  She reports  initial improvement in mood with sertraline until last 2 months. She has been feeling more down and depressed. Last week she had 4 anxiety attack in one day with no cause. Some trouble sleeping too much.. Not feeling rested. She  Is currently seeing a Social worker.  PHQ9: 11 GAD7: 7   Social History /Family History/Past Medical History reviewed in detail and updated in EMR if needed. Blood pressure 126/76, pulse 94, temperature 98.4 F (36.9 C), temperature source Oral, height 5' 7.25" (1.708 m), weight (!) 301 lb 12 oz (136.9 kg), last menstrual period 07/09/2018, SpO2 96 %.  Review of Systems  Constitutional: Negative for fatigue and fever.  HENT: Negative for ear pain.   Eyes: Negative for pain.  Respiratory: Negative for chest tightness and shortness of breath.   Cardiovascular: Negative for chest pain, palpitations and leg swelling.  Gastrointestinal: Negative for abdominal pain.  Genitourinary: Negative for dysuria.       Objective:   Physical Exam  Constitutional: Vital signs are normal. She appears well-developed and well-nourished. She is cooperative.  Non-toxic appearance. She does not appear ill. No distress.  HENT:  Head: Normocephalic.  Right Ear: Hearing, tympanic membrane, external ear and ear canal normal. Tympanic membrane is not erythematous, not retracted and not bulging.  Left Ear: Hearing, tympanic membrane, external ear and ear canal normal. Tympanic membrane is not erythematous, not retracted and not bulging.  Nose: No mucosal edema or rhinorrhea. Right sinus exhibits no maxillary sinus tenderness and no frontal sinus tenderness. Left sinus exhibits no maxillary sinus tenderness and no frontal sinus tenderness.  Mouth/Throat: Uvula is midline, oropharynx is clear and moist and  mucous membranes are normal.  Eyes: Pupils are equal, round, and reactive to light. Conjunctivae, EOM and lids are normal. Lids are everted and swept, no foreign bodies found.  Neck: Trachea normal and normal range of motion. Neck supple. Carotid bruit is not present. No thyroid mass and no thyromegaly present.  Cardiovascular: Normal rate, regular rhythm, S1 normal, S2 normal, normal heart sounds, intact distal pulses and normal pulses. Exam reveals no gallop and no friction rub.  No murmur heard. Pulmonary/Chest: Effort normal and breath sounds normal. No tachypnea. No respiratory distress. She has no decreased breath sounds. She has no wheezes. She has no rhonchi. She has no rales.  Abdominal: Soft. Normal appearance and bowel sounds are normal. There is no tenderness.  Neurological: She is alert.  Skin: Skin is warm, dry and intact. No rash noted.  Psychiatric: Her speech is normal and behavior is normal. Judgment and thought content normal. Her mood appears not anxious. Her affect is blunt. Cognition and memory are normal. She does not exhibit a depressed mood.          Assessment & Plan:

## 2018-07-22 ENCOUNTER — Encounter: Payer: Self-pay | Admitting: Dietician

## 2018-07-22 NOTE — Progress Notes (Signed)
Have not heard back from patient to reschedule her missed appointment from 06/06/18. Sent discharge letter to referring provider.

## 2018-07-25 ENCOUNTER — Other Ambulatory Visit: Payer: Self-pay | Admitting: Family Medicine

## 2018-07-25 ENCOUNTER — Telehealth: Payer: Self-pay | Admitting: Family Medicine

## 2018-07-25 DIAGNOSIS — F331 Major depressive disorder, recurrent, moderate: Secondary | ICD-10-CM | POA: Diagnosis not present

## 2018-07-25 NOTE — Telephone Encounter (Signed)
Copied from [place] [id number]. Topic: Quick Communication - See Telephone Encounter >> Jul 25, 2018  3:07 PM Vernona Rieger wrote: CRM for notification. See Telephone encounter for: 07/25/18.  Patient states that her therapist, Ruthy Dick stated she needed to take some time off. She is taking the rest of this week and wants Dr Diona Browner to write her out for dates 7/29-8/2. Patient is aware Dr Diona Browner is out of the office this week and would get it upon return.

## 2018-07-26 NOTE — Telephone Encounter (Signed)
[facility] paperwork in dr bedsole's in box

## 2018-08-04 ENCOUNTER — Other Ambulatory Visit: Payer: Self-pay | Admitting: Family Medicine

## 2018-08-04 NOTE — Telephone Encounter (Signed)
Last office visit 07/19/2018.  Last refilled 05/31/2018 for #30 with no refills.  Ok to refill?

## 2018-08-05 NOTE — Telephone Encounter (Signed)
Left message asking pt to call office  Paperwork faxed 08/03/18 Copy for pt  Copy for scan

## 2018-08-05 NOTE — Telephone Encounter (Signed)
° °  Gave pt [name] message

## 2018-08-06 DIAGNOSIS — M461 Sacroiliitis, not elsewhere classified: Secondary | ICD-10-CM | POA: Diagnosis not present

## 2018-08-08 ENCOUNTER — Telehealth: Payer: Self-pay | Admitting: Family Medicine

## 2018-08-08 DIAGNOSIS — F331 Major depressive disorder, recurrent, moderate: Secondary | ICD-10-CM | POA: Diagnosis not present

## 2018-08-08 NOTE — Telephone Encounter (Signed)
[facility] faxed paperwork needing additional information In dr Diona Browner in box

## 2018-08-10 ENCOUNTER — Encounter: Payer: Self-pay | Admitting: Internal Medicine

## 2018-08-10 ENCOUNTER — Telehealth: Payer: Self-pay

## 2018-08-10 ENCOUNTER — Encounter

## 2018-08-10 ENCOUNTER — Ambulatory Visit: Payer: BLUE CROSS/BLUE SHIELD | Admitting: Internal Medicine

## 2018-08-10 VITALS — BP 124/80 | HR 83 | Temp 98.5°F | Wt 299.0 lb

## 2018-08-10 DIAGNOSIS — L0501 Pilonidal cyst with abscess: Secondary | ICD-10-CM

## 2018-08-10 MED ORDER — SULFAMETHOXAZOLE-TRIMETHOPRIM 800-160 MG PO TABS: 1.0000 | ORAL_TABLET | Freq: Two times a day (BID) | ORAL | 0 refills | Status: DC

## 2018-08-10 NOTE — Telephone Encounter (Signed)
Pt called office wanting clarification on abx and AVS instructions. Answered all questions to pt's satisfaction.
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
## 2018-08-10 NOTE — Patient Instructions (Signed)
Perianal Abscess  An abscess is an infected area that is filled with pus. A perianal abscess occurs in the perineum, which is the area between the anus and the scrotum in males and between the anus and the vagina in females. Perianal abscesses can vary in size. Without treatment, a perianal abscess can become larger and cause other problems.  What are the causes?  This condition is caused by:  · Waste from damaged or dead tissue (debris) that plugs up glands in the perineum. When this happens, an abscess may form.  · Infections of the perineum.    What are the signs or symptoms?  Common symptoms of this condition include:  · Swelling and redness in the area of the abscess. The redness may go beyond the abscess and appear as a red streak on the skin.  · Pain in the area of the abscess, including pain when sitting, walking, or passing stool.    Other possible symptoms include:  · A visible, painful lump, or a lump that can be felt when touched.  · Bleeding or pus-like discharge from the area.  · Fever.  · General weakness.    How is this diagnosed?  This condition is diagnosed based on your medical history and a physical exam of the affected area.  · This may involve examining the rectal area with a gloved hand (digital rectal exam).  · Sometimes, the health care provider needs to look into the rectum using a probe or a scope.  · For women, it may require a careful vaginal exam.    How is this treated?  Treatment for this condition may include:  · Making a cut (incision) in the abscess to drain the pus. This can sometimes be done in your health care provider's office or an emergency department after you are given medicine to numb the area (local anesthetic).  · Surgery to drain the abscess. This is for larger or deeper abscesses.  · Antibiotic medicines, if there is infection in the surrounding tissue (cellulitis).  · Having gauze packed into the abscess to continue draining the area.  · Frequent baths in warm water  that is deep enough to cover your hips and buttocks (sitz baths). These help the wound heal and they make the abscess less likely to come back.    Follow these instructions at home:  Medicines  · Take over-the-counter and prescription medicines for pain, fever, or discomfort only as told by your health care provider.  · If you were prescribed an antibiotic medicine, use it as told by your health care provider. Do not stop using the antibiotic even if you start to feel better.  · Do not drive or use heavy machinery while taking prescription pain medicine.  Wound care    · Keep the skin around the wound clean and dry. Avoid cleaning the area too much.  · Avoid scratching the wound.  · Avoid using colored or perfumed toilet papers.  · Take a sitz bath 3-4 times a day and after bowel movements. This will help reduce pain and swelling.  · If directed, apply ice to the injured area:  ? Put ice in a plastic bag.  ? Place a towel between your skin and the bag.  ? Leave the ice on for 20 minutes, 2-3 times a day.  · Check your incision area every day for signs of infection. Check for:  ? More redness, swelling, or pain.  ? More fluid or blood.  ?   Warmth.  ? Pus or a bad smell.  Gauze  · If gauze was used in the abscess, follow instructions from your health care provider about removing or changing the gauze. It can usually be removed in 2-3 days.  · Wash your hands with soap and water before you remove or change your gauze. If soap and water are not available, use hand sanitizer.  · If one or more drains were placed in the abscess cavity, be careful not to pull at them. Your health care provider will tell you how long they need to remain in place.  General instructions  · Keep all follow-up visits as told by your health care provider. This is important.  Contact a health care provider if:  · You have trouble passing stool or passing urine.  · Your pain or swelling in the affected area does not seem to be getting  better.  · The gauze packing or the drains come out before the planned time.  Get help right away if:  · You have problems moving or using your legs.  · You have severe or increasing pain.  · Your swelling in the affected area suddenly gets worse.  · You have a large increase in bleeding or passing of pus.  · You have chills or a fever.  This information is not intended to replace advice given to you by your health care provider. Make sure you discuss any questions you have with your health care provider.  Document Released: 01/20/2007 Document Revised: 07/03/2016 Document Reviewed: 05/25/2016  Elsevier Interactive Patient Education © 2018 [employer]

## 2018-08-12 ENCOUNTER — Telehealth: Payer: Self-pay | Admitting: Primary Care

## 2018-08-12 ENCOUNTER — Other Ambulatory Visit: Payer: Self-pay | Admitting: Family Medicine

## 2018-08-12 ENCOUNTER — Ambulatory Visit: Payer: Self-pay | Admitting: Family Medicine

## 2018-08-12 DIAGNOSIS — K611 Rectal abscess: Secondary | ICD-10-CM

## 2018-08-12 MED ORDER — DOXYCYCLINE HYCLATE 100 MG PO TABS: 100.0000 mg | ORAL_TABLET | Freq: Two times a day (BID) | ORAL | 0 refills | Status: DC

## 2018-08-12 NOTE — Telephone Encounter (Signed)
Patient dropped by the office with reports dizziness, GI upset, sweats, nausea since starting the antibiotics (Bactrim DS) for recent perirectal abscess. Temperature today 98.6. Evaluated I&D site, appears to be healing well, no additional signs of infection. Will switch antibiotics to Doxycyline, need to cover for potential MRSA, she will update.

## 2018-08-16 ENCOUNTER — Ambulatory Visit: Payer: Self-pay | Admitting: Family Medicine

## 2018-08-19 ENCOUNTER — Ambulatory Visit: Payer: BLUE CROSS/BLUE SHIELD | Admitting: Family Medicine

## 2018-08-19 DIAGNOSIS — Z0289 Encounter for other administrative examinations: Secondary | ICD-10-CM

## 2018-08-22 ENCOUNTER — Other Ambulatory Visit: Payer: Self-pay

## 2018-08-22 DIAGNOSIS — F331 Major depressive disorder, recurrent, moderate: Secondary | ICD-10-CM | POA: Diagnosis not present

## 2018-08-23 MED ORDER — MELOXICAM 15 MG PO TABS: 15.0000 mg | ORAL_TABLET | Freq: Every day | ORAL | 0 refills | Status: DC

## 2018-08-23 NOTE — Telephone Encounter (Signed)
Last office visit 08/10/2018 with R. Garnette Gunner for Pilonidal cyst.  Last refilled 08/04/2018 for #30 with no refills.  Ok to refill?

## 2018-08-31 ENCOUNTER — Telehealth: Payer: Self-pay | Admitting: Family Medicine

## 2018-08-31 MED ORDER — SERTRALINE HCL 100 MG PO TABS: 100.0000 mg | ORAL_TABLET | Freq: Every day | ORAL | 0 refills | Status: DC

## 2018-08-31 NOTE — Telephone Encounter (Signed)
Copied from [place] [id number]. Topic: Quick Communication - Rx Refill/Question >> Aug 31, 2018 11:15 AM Alfredia Ferguson R wrote: Medication: sertraline (ZOLOFT) 100 MG tablet  Has the patient contacted their pharmacy? Yes  Preferred Pharmacy (with phone number or street name): [place] ([facility] - [place] [phone] (Phone) [phone] (Fax)   Agent: Please be advised that RX refills may take up to 3 business days. We ask that you follow-up with your pharmacy.

## 2018-08-31 NOTE — Telephone Encounter (Signed)
Contacted pt regarding refill; Dr Rometta Emery note dated 07/19/18 requests that pt be seen around 08/16/18; no visits noted; no upcoming visits noted; pt offered and accepted appointment with Dr Diona Browner, [place], 09/15/18 at 1430; she verbalizes understanding; she also says that she is out of medication and requests that her zoloft refill be sent to [facility] in [place].

## 2018-09-06 ENCOUNTER — Ambulatory Visit: Payer: BLUE CROSS/BLUE SHIELD | Admitting: Family Medicine

## 2018-09-06 DIAGNOSIS — Z0289 Encounter for other administrative examinations: Secondary | ICD-10-CM

## 2018-09-10 ENCOUNTER — Other Ambulatory Visit: Payer: Self-pay | Admitting: Family Medicine

## 2018-09-12 DIAGNOSIS — L0591 Pilonidal cyst without abscess: Secondary | ICD-10-CM | POA: Diagnosis not present

## 2018-09-12 NOTE — Telephone Encounter (Signed)
Electronic refill request Last refill 03/25/18 #30/1 Last office visit 08/10/18/acute

## 2018-09-13 ENCOUNTER — Encounter: Payer: Self-pay | Admitting: Internal Medicine

## 2018-09-13 ENCOUNTER — Ambulatory Visit: Payer: BLUE CROSS/BLUE SHIELD | Admitting: Internal Medicine

## 2018-09-13 VITALS — BP 120/78 | HR 120 | Temp 98.4°F | Ht 67.25 in | Wt 300.0 lb

## 2018-09-13 DIAGNOSIS — L0591 Pilonidal cyst without abscess: Secondary | ICD-10-CM | POA: Insufficient documentation

## 2018-09-13 MED ORDER — AMOXICILLIN-POT CLAVULANATE 875-125 MG PO TABS: 1.0000 | ORAL_TABLET | Freq: Two times a day (BID) | ORAL | 1 refills | Status: DC

## 2018-09-13 NOTE — Assessment & Plan Note (Signed)
Discussed warm soak in tub to allow complete drainage Will change to augmentin for better standard coverage Should consider surgical evaluation if recurs

## 2018-09-13 NOTE — Patient Instructions (Signed)
If this recurs soon, I can make a referral for you to see a surgeon.
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
## 2018-09-16 ENCOUNTER — Other Ambulatory Visit: Payer: Self-pay | Admitting: Family Medicine

## 2018-09-16 ENCOUNTER — Encounter: Payer: Self-pay | Admitting: Family Medicine

## 2018-09-16 ENCOUNTER — Ambulatory Visit: Payer: BLUE CROSS/BLUE SHIELD | Admitting: Family Medicine

## 2018-09-16 VITALS — BP 120/80 | HR 106 | Temp 98.6°F | Ht 67.25 in | Wt 300.5 lb

## 2018-09-16 DIAGNOSIS — F321 Major depressive disorder, single episode, moderate: Secondary | ICD-10-CM | POA: Diagnosis not present

## 2018-09-16 DIAGNOSIS — L0591 Pilonidal cyst without abscess: Secondary | ICD-10-CM

## 2018-09-16 DIAGNOSIS — Z3009 Encounter for other general counseling and advice on contraception: Secondary | ICD-10-CM

## 2018-09-16 NOTE — Assessment & Plan Note (Signed)
Improved mood with higher dose of sertraline.

## 2018-09-16 NOTE — Progress Notes (Signed)
   Subjective:    Patient ID: [name], female    DOB: 03-Jul-1991, 27 y.o.   MRN: [id number]  HPI 27 year old female presents for follow up MDD.  At last OV 7/27.. We increased sertraline to 100 ,mg daily at bedtime. Today she reports she has noted some improvement.. 20 % improvement.  She feels more motivated, she is getting out more, doing more things.   NO SE to sertraline.   She is still burnt out work.  Trouble falling asleep.    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  In past she tried venlafaxine with minimal improvement.   She is currently on patch. She is interested in more info on implant and depo. No SE to patch.  Forgets to take OCP in past  Review of Systems  Constitutional: Negative for fatigue and fever.  HENT: Negative for ear pain.   Eyes: Negative for pain.  Respiratory: Negative for chest tightness and shortness of breath.   Cardiovascular: Negative for chest pain, palpitations and leg swelling.  Gastrointestinal: Negative for abdominal pain.  Genitourinary: Negative for dysuria.       Objective:   Physical Exam  Constitutional: Vital signs are normal. She appears well-developed and well-nourished. She is cooperative.  Non-toxic appearance. She does not appear ill. No distress.  HENT:  Head: Normocephalic.  Right Ear: Hearing, tympanic membrane, external ear and ear canal normal. Tympanic membrane is not erythematous, not retracted and not bulging.  Left Ear: Hearing, tympanic membrane, external ear and ear canal normal. Tympanic membrane is not erythematous, not retracted and not bulging.  Nose: No mucosal edema or rhinorrhea. Right sinus exhibits no maxillary sinus tenderness and no frontal sinus tenderness. Left sinus exhibits no maxillary sinus tenderness and no frontal sinus tenderness.  Mouth/Throat: Uvula is midline, oropharynx is clear and moist and mucous membranes are normal.  Eyes: Pupils are equal, round, and  reactive to light. Conjunctivae, EOM and lids are normal. Lids are everted and swept, no foreign bodies found.  Neck: Trachea normal and normal range of motion. Neck supple. Carotid bruit is not present. No thyroid mass and no thyromegaly present.  Cardiovascular: Normal rate, regular rhythm, S1 normal, S2 normal, normal heart sounds, intact distal pulses and normal pulses. Exam reveals no gallop and no friction rub.  No murmur heard. Pulmonary/Chest: Effort normal and breath sounds normal. No tachypnea. No respiratory distress. She has no decreased breath sounds. She has no wheezes. She has no rhonchi. She has no rales.  Abdominal: Soft. Normal appearance and bowel sounds are normal. There is no tenderness.  Neurological: She is alert.  Skin: Skin is warm, dry and intact. No rash noted.  Psychiatric: Her speech is normal and behavior is normal. Judgment and thought content normal. Her mood appears not anxious. Cognition and memory are normal. She does not exhibit a depressed mood.          Assessment & Plan:

## 2018-09-16 NOTE — Assessment & Plan Note (Signed)
Reviewed options and discussed pros and cons of each. She will consider option. Continue patch for now.

## 2018-09-16 NOTE — Patient Instructions (Addendum)
Continue seeing counselor.  Continue sertraline at 100 mg daily.  Call if you decide to move forward with depo ( call within 2 weeks of menses and we can do urine preg and given DEPO that day)

## 2018-09-16 NOTE — Assessment & Plan Note (Signed)
Resolving . Complete antibiotics. 

## 2018-09-21 ENCOUNTER — Other Ambulatory Visit: Payer: Self-pay | Admitting: *Deleted

## 2018-09-21 DIAGNOSIS — F331 Major depressive disorder, recurrent, moderate: Secondary | ICD-10-CM | POA: Diagnosis not present

## 2018-09-21 MED ORDER — SERTRALINE HCL 100 MG PO TABS: 100.0000 mg | ORAL_TABLET | Freq: Every day | ORAL | 1 refills | Status: DC

## 2018-09-30 ENCOUNTER — Other Ambulatory Visit: Payer: Self-pay | Admitting: Family Medicine

## 2018-09-30 NOTE — Telephone Encounter (Addendum)
Last office visit 09/16/2018.  Not on current medication list.  States discontinued on 09/16/2018 by Dr. Diona Browner.  Refill?

## 2018-10-18 DIAGNOSIS — F331 Major depressive disorder, recurrent, moderate: Secondary | ICD-10-CM | POA: Diagnosis not present

## 2018-11-14 DIAGNOSIS — F331 Major depressive disorder, recurrent, moderate: Secondary | ICD-10-CM | POA: Diagnosis not present

## 2018-11-25 ENCOUNTER — Ambulatory Visit: Payer: Self-pay | Admitting: Family Medicine

## 2018-12-07 DIAGNOSIS — F331 Major depressive disorder, recurrent, moderate: Secondary | ICD-10-CM | POA: Diagnosis not present

## 2018-12-19 ENCOUNTER — Encounter: Payer: Self-pay | Admitting: Family Medicine

## 2018-12-19 ENCOUNTER — Ambulatory Visit: Payer: BLUE CROSS/BLUE SHIELD | Admitting: Family Medicine

## 2018-12-19 ENCOUNTER — Telehealth: Payer: Self-pay | Admitting: Family Medicine

## 2018-12-19 VITALS — BP 148/82 | HR 106 | Temp 99.4°F | Ht 67.25 in | Wt 302.0 lb

## 2018-12-19 DIAGNOSIS — L0591 Pilonidal cyst without abscess: Secondary | ICD-10-CM | POA: Diagnosis not present

## 2018-12-19 MED ORDER — METRONIDAZOLE 500 MG PO TABS: 500.0000 mg | ORAL_TABLET | Freq: Three times a day (TID) | ORAL | 0 refills | Status: DC

## 2018-12-19 MED ORDER — CEPHALEXIN 500 MG PO CAPS: 500.0000 mg | ORAL_CAPSULE | Freq: Two times a day (BID) | ORAL | 0 refills | Status: AC

## 2018-12-19 NOTE — Assessment & Plan Note (Signed)
3rd recurrence. I&D today. No erythema so watch and wait for antibiotics - return if worsening. Referral to surgery for definitive management.

## 2018-12-19 NOTE — Telephone Encounter (Signed)
Error not needed

## 2018-12-19 NOTE — Patient Instructions (Addendum)
Today you had an incision and drainage of the pilonidal cyst.   Keep the area covered today.   You can remove the packing in 2-3 days.   Keep the area covered and dry while the packing is in place.  Once the packing is removed it is OK to shower/bathe.   If you start developing fevers or chills or you notice that there is redness in the area or worsening pain - you can start the antibiotic -- if you symptoms do not improve in 1 day then return to clinic.   You will need to avoid alcohol if you start metronidazole

## 2018-12-19 NOTE — Progress Notes (Signed)
Subjective:     [name] is a 27 y.o. female presenting for cyst on tail bone (started on 12/16/2018. Flred up today. Constant pain and pain radiating to both sides of her hips.)     HPI   #cyst - noticed it first on Friday - then got worse Sunday - has been coming and going with irritation - yesterday was bad - has not noticed drainage - feeling OK today, was feeling sick over the weekend with cold symptoms - has not been doing preventative care - uses alcohol occasionally - wanting to know if she can see a surgery - feels the pain all the way to her hips   Review of Systems  Constitutional: Negative for chills and fever.     Social History   Tobacco Use  Smoking Status Never Smoker  Smokeless Tobacco Never Used        Objective:    BP Readings from Last 3 Encounters:  12/19/18 (!) 148/82  09/16/18 120/80  09/13/18 120/78   Wt Readings from Last 3 Encounters:  12/19/18 (!) 302 lb (137 kg)  09/16/18 (!) 300 lb 8 oz (136.3 kg)  09/13/18 300 lb (136.1 kg)    BP (!) 148/82   Pulse (!) 106   Temp 99.4 F (37.4 C)   Ht 5' 7.25" (1.708 m)   Wt (!) 302 lb (137 kg)   SpO2 96%   BMI 46.95 kg/m    Physical Exam Constitutional:      General: She is not in acute distress.    Appearance: She is obese. She is not ill-appearing.  HENT:     Right Ear: External ear normal.     Left Ear: External ear normal.     Mouth/Throat:     Mouth: Mucous membranes are moist.  Eyes:     Extraocular Movements: Extraocular movements intact.     Conjunctiva/sclera: Conjunctivae normal.  Cardiovascular:     Rate and Rhythm: Normal rate.  Pulmonary:     Effort: Pulmonary effort is normal.  Skin:    General: Skin is warm.     Comments: Pilonidal area with fluctuant area. No erythema. TTP.   Neurological:     General: No focal deficit present.     Mental Status: She is alert. Mental status is at baseline.  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 I&D today. No erythema so watch and wait for antibiotics - return if worsening. Referral to surgery for definitive management.       Relevant Medications   cephALEXin (KEFLEX) 500 MG capsule   metroNIDAZOLE (FLAGYL) 500 MG tablet   Other Relevant Orders   Ambulatory referral to General Surgery      Procedure Note: incision and Drainage   Meds, vitals, and allergies reviewed.   Indication: suspected infected pilonidal cyst   Pt complaints of: erythema, pain, swelling  Location: pilonidal area  Size: approximately 1 cm of thin tissue on tension  Informed consent obtained.  Pt aware of risks not limited to but including infection, bleeding, damage to near by organs.  Prep: etoh/chloriprep   Anesthesia: 1%lidocaine with epi, good effect  Incision made with #11 blade  Wound explored and no loculations noted  Copious amount of dark  brown and blood tinged discharge with strong odor.   Wound packed with iodoform gauze only approximately 2-3 cm.   Tolerated well  Routine postprocedure instructions d/w pt- remove packing in 24-48h, keep area clean and bandaged, follow up if concerns/spreading erythema/pain.   Return in about 1 week (around 12/26/2018) for if symptoms not improved.  Lesleigh Noe, MD

## 2018-12-29 DIAGNOSIS — F331 Major depressive disorder, recurrent, moderate: Secondary | ICD-10-CM | POA: Diagnosis not present

## 2019-01-03 ENCOUNTER — Other Ambulatory Visit: Payer: Self-pay

## 2019-01-03 ENCOUNTER — Ambulatory Visit (INDEPENDENT_AMBULATORY_CARE_PROVIDER_SITE_OTHER): Payer: 59 | Admitting: Surgery

## 2019-01-03 ENCOUNTER — Encounter: Payer: Self-pay | Admitting: Surgery

## 2019-01-03 VITALS — BP 126/84 | HR 96 | Temp 97.5°F | Resp 16 | Ht 67.75 in | Wt 302.0 lb

## 2019-01-03 DIAGNOSIS — L0591 Pilonidal cyst without abscess: Secondary | ICD-10-CM | POA: Diagnosis not present

## 2019-01-03 NOTE — Progress Notes (Signed)
Surgical Clinic History and Physical  Referring provider:  Jinny Sanders, MD [place], [place] [address]  HISTORY OF PRESENT ILLNESS (HPI):  28 y.o. female presents for evaluation of her pilonidal cyst. Patient reports her pilonidal cyst has been present x 4 - 6 years with minimal discomfort until has required incision and drainage x 3 since August, most recently this past month (December). She was prescribed a course of antibiotics following the first two incision and drainage procedures, but not after the third (most recent). She is anticipating a change in her insurance and wishes to wait until she switches to her new insurance to schedule excision of her pilonidal cyst. She otherwise denies any fever/chills, CP, or SOB.  PAST MEDICAL HISTORY (PMH):  Past Medical History:  Diagnosis Date  . Migraine   . Pilonidal cyst   . Polycystic ovaries     PAST SURGICAL HISTORY ([place]):  Past Surgical History:  Procedure Laterality Date  . INCISION AND DRAINAGE     x3 pilonidal cyst    MEDICATIONS:  Prior to Admission medications   Medication Sig Start Date End Date Taking? Authorizing Provider  acetaminophen (TYLENOL) 650 MG CR tablet Take by mouth.   Yes [provider]  norethindrone-ethinyl estradiol (JUNEL FE,GILDESS FE,LOESTRIN FE) 1-20 MG-MCG tablet Take 1 tablet by mouth daily.   Yes [provider]  cyclobenzaprine (FLEXERIL) 10 MG tablet TAKE 0.5-1 TABLETS (5-10 MG TOTAL) BY MOUTH AT BEDTIME. Patient not taking: Reported on 01/03/2019 09/13/18   Jinny Sanders, MD  sertraline (ZOLOFT) 100 MG tablet Take 1 tablet (100 mg total) by mouth at bedtime. Patient not taking: Reported on 01/03/2019 09/21/18   Jinny Sanders, MD   ALLERGIES:  No Known Allergies   SOCIAL HISTORY:  Social History   Socioeconomic History  . Marital status: Single    Spouse name: Not on file  . Number of children: 0  . Years of education: Not on file  . Highest education  level: Not on file  Occupational History  . Occupation: Microbiology lab    Employer: [employer]  . Financial resource strain: Not on file  . Food insecurity:    Worry: Not on file    Inability: Not on file  . Transportation needs:    Medical: Not on file    Non-medical: Not on file  Tobacco Use  . Smoking status: Never Smoker  . Smokeless tobacco: Never Used  Substance and Sexual Activity  . Alcohol use: Yes    Comment: seldom, less than one drink weekly  . Drug use: No  . Sexual activity: Not Currently  Lifestyle  . Physical activity:    Days per week: Not on file    Minutes per session: Not on file  . Stress: Not on file  Relationships  . Social connections:    Talks on phone: Not on file    Gets together: Not on file    Attends religious service: Not on file    Active member of club or organization: Not on file    Attends meetings of clubs or organizations: Not on file    Relationship status: Not on file  . Intimate partner violence:    Fear of current or ex partner: Not on file    Emotionally abused: Not on file    Physically abused: Not on file    Forced sexual activity: Not on file  Other Topics Concern  . Not on file  Social History Narrative   REGULAR EXERCISE-NO   Healthy eating.   HAS EXPERIENCED PHYSICAL ABUSE   SLEEPS 7-9 HOURS PER NIGHT   5 PEOPLE LIVING IN THE RESIDENCE    The patient currently resides (home / rehab facility / nursing home): Home The patient normally is (ambulatory / bedbound): Ambulatory  FAMILY HISTORY:  Family History  Problem Relation Age of Onset  . Leukemia Mother 65  . Hypertension Father     Otherwise negative/non-contributory.  REVIEW OF SYSTEMS:  Constitutional: denies any other weight loss, fever, chills, or sweats  Eyes: denies any other vision changes, history of eye injury  ENT: denies sore throat, hearing problems  Respiratory: denies shortness of breath, wheezing  Cardiovascular: denies chest  pain, palpitations  Gastrointestinal: denies abdominal pain, N/V, or diarrhea Musculoskeletal: denies any other joint pains or cramps  Skin: Denies any other rashes or skin discolorations except as per HPI Neurological: denies any other headache, dizziness, weakness  Psychiatric: Denies any other depression, anxiety   All other review of systems were otherwise negative   VITAL SIGNS:  BP 126/84   Pulse 96   Temp (!) 97.5 F (36.4 C) (Skin)   Resp 16   Ht 5' 7.75" (1.721 m)   Wt (!) 302 lb (137 kg)   LMP 12/27/2018   SpO2 97%   BMI 46.26 kg/m   PHYSICAL EXAM:  Constitutional:  -- Obese body habitus  -- Awake, alert, and oriented x3  Eyes:  -- Pupils equally round and reactive to light  -- No scleral icterus  Ear, nose, throat:  -- No jugular venous distension -- No nasal drainage, bleeding Pulmonary:  -- No crackles  -- Equal breath sounds bilaterally -- Breathing non-labored at rest Cardiovascular:  -- S1, S2 present  -- No pericardial rubs  Gastrointestinal:  -- Abdomen soft, nontender, non-distended, no guarding/rebound  -- No abdominal masses appreciated, pulsatile or otherwise  Musculoskeletal and Integumentary:  -- Wounds or skin discoloration: firm mildly-/moderately- tender to palpation pilonidal cyst with 1 - 2 visible sinus tracts without current drainage or erythema and with well-healed former incision and drainage scar -- Extremities: B/L UE and LE FROM, hands and feet warm  Neurologic:  -- Motor function: Intact and symmetric -- Sensation: Intact and symmetric  Labs:  CBC Latest Ref Rng & Units 05/19/2013 08/03/2012 07/02/2010  WBC 4.5 - 10.5 K/uL 6.2 4.0(L) 3.5(L)  Hemoglobin 12.0 - 15.0 g/dL 12.5 12.4 11.3(L)  Hematocrit 36.0 - 46.0 % 38.4 38.9 34.8(L)  Platelets 150.0 - 400.0 K/uL 372.0 357.0 401.0(H)   CMP Latest Ref Rng & Units 06/29/2017 04/08/2015 05/19/2013  Glucose 70 - 99 mg/dL 87 78 82  BUN 6 - 23 mg/dL 12 9 7   Creatinine 0.40 - 1.20 mg/dL 0.95  0.81 0.8  Sodium 135 - 145 mEq/L 139 141 140  Potassium 3.5 - 5.1 mEq/L 3.7 4.3 4.1  Chloride 96 - 112 mEq/L 104 102 109  CO2 19 - 32 mEq/L 28 25 24   Calcium 8.4 - 10.5 mg/dL 9.3 9.4 8.6  Total Protein 6.0 - 8.3 g/dL 7.3 7.2 5.9(L)  Total Bilirubin 0.2 - 1.2 mg/dL 0.2 0.3 0.2(L)  Alkaline Phos 39 - 117 U/L 77 96 67  AST 0 - 37 U/L 17 17 17   ALT 0 - 35 U/L 13 14 12    Imaging studies: No new pertinent imaging studies available for review at this time   Assessment/Plan:  28 y.o. female with an increasingly symptomatic and multiply previously  infected currently non-infected pilonidal cyst and sinus tract(s), complicated by co-morbidities including morbid obesity (BMI >46), polycystic ovarian syndrome and migraine headaches.   - discussed with patient natural history of pilonidal cysts and importance of hygiene  - all risks, benefits, alternatives to and anticipated recovery following excision of increasingly symptomatic previously infected pilonidal cyst were discussed with the patient (including wet-to-dry dressing changes vs wound VAC vs less likely closure), all of her questions were answered to her expressed satisfaction, patient expresses she wishes to proceed, and informed consent was obtained accordingly.  - patient says she will call to schedule surgery vs pre-op appointment prior to surgery pending upcoming change in her insurance, though she hopes to have her surgery done within the next month or so  - anticipate return to clinic 2 weeks following above procedure  - instructed to call if any questions or concerns  All of the above recommendations were discussed with the patient, and all of patient's questions were answered to her expressed satisfaction.  Thank you for the opportunity to participate in this patient's care.  -- Marilynne Drivers Rosana Hoes, MD, [place]: [facility] - Partnering for exceptional care. Office: [phone]

## 2019-01-03 NOTE — Patient Instructions (Signed)
The patient is aware to call back for any questions or new concerns.   Pilonidal Cyst  A pilonidal cyst is a fluid-filled sac that forms beneath the skin near the tailbone, at the top of the crease of the buttocks (pilonidal area). If the cyst is not large and not infected, it may not cause any problems. If the cyst becomes irritated or infected, it may get larger and fill with pus. An infected cyst is called an abscess. A pilonidal abscess may cause pain and swelling, and it may need to be drained or removed. What are the causes? The cause of this condition is not always known. In some cases, a hair that grows into your skin (ingrown hair) may be the cause. What increases the risk? You are more likely to get a pilonidal cyst if you:  Are female.  Have lots of hair near the crease of the buttocks.  Are overweight.  Have a dimple near the crease of the buttocks.  Wear tight clothing.  Do not bathe or shower often.  Sit for long periods of time. What are the signs or symptoms? Signs and symptoms of a pilonidal cyst may include pain, swelling, redness, and warmth in the pilonidal area. Depending on how big the cyst is, you may be able to feel a lump near your tailbone. If your cyst becomes infected, symptoms may include:  Pus or fluid drainage.  Fever.  Pain, swelling, and redness getting worse.  The lump getting bigger. How is this diagnosed? This condition may be diagnosed based on:  Your symptoms and medical history.  A physical exam.  A blood test to check for infection.  Testing a pus sample, if applicable. How is this treated? If your cyst does not cause symptoms, you may not need any treatment. If your cyst bothers you or is infected, you may need a procedure to drain or remove the cyst. Depending on the size, location, and severity of your cyst, your health care provider may:  Make an incision in the cyst and drain it (incision and drainage).  Open and drain the  cyst, and then stitch the wound so that it stays open while it heals (marsupialization). You will be given instructions about how to care for your open wound while it heals.  Remove all or part of the cyst, and then close the wound (cyst removal). You may need to take antibiotic medicines before your procedure. Follow these instructions at home: Medicines  Take over-the-counter and prescription medicines only as told by your health care provider.  If you were prescribed an antibiotic medicine, take it as told by your health care provider. Do not stop taking the antibiotic even if you start to feel better. General instructions  Keep the area around your pilonidal cyst clean and dry.  If there is fluid or pus draining from your cyst: ? Cover the area with a clean bandage (dressing) as needed. ? Wash the area gently with soap and water. Pat the area dry with a clean towel. Do not rub the area because that may cause bleeding.  Remove hair from the area around the cyst only if your health care provider tells you to do this.  Do not wear tight pants or sit in one position for long periods at a time.  Keep all follow-up visits as told by your health care provider. This is important. Contact a health care provider if you have:  New redness, swelling, or pain.  A fever.  Severe pain. Summary  A pilonidal cyst is a fluid-filled sac that forms beneath the skin near the tailbone, at the top of the crease of the buttocks (pilonidal area).  If the cyst becomes irritated or infected, it may get larger and fill with pus. An infected cyst is called an abscess.  The cause of this condition is not always known. In some cases, a hair that grows into your skin (ingrown hair) may be the cause.  If your cyst does not cause symptoms, you may not need any treatment. If your cyst bothers you or is infected, you may need a procedure to drain or remove the cyst. This information is not intended to replace  advice given to you by your health care provider. Make sure you discuss any questions you have with your health care provider. Document Released: 12/11/2000 Document Revised: 12/02/2017 Document Reviewed: 12/02/2017 Elsevier Interactive Patient Education  2019 [employer].

## 2019-01-07 ENCOUNTER — Encounter: Payer: Self-pay | Admitting: Surgery

## 2019-01-10 ENCOUNTER — Telehealth: Payer: Self-pay

## 2019-01-10 NOTE — Telephone Encounter (Signed)
Patient called and said that she would like to schedule her pilonidal cyst excision surgery with Dr Rosana Hoes.  She would like to have wet to dry dressings post surgery. The patient is scheduled for surgery at [facility] with Dr Rosana Hoes on 01/18/19. She will pre admit by phone and pre admit testing will be calling her to do this. The patient is aware of date and instructions.

## 2019-01-12 ENCOUNTER — Inpatient Hospital Stay
Admission: RE | Admit: 2019-01-12 | Discharge: 2019-01-12 | Disposition: A | Discharge status: Home / Self Care | Payer: Self-pay | Source: Ambulatory Visit

## 2019-01-12 NOTE — Pre-Procedure Instructions (Signed)
CALLED PT TO DO PREOP PHONE INTERVIEW AND PT STATES SHE IS GOING TO HAVE TO POSTPONE HER SURGERY. SHE WILL CALL DR Denaya Horn' OFFICE AND LET THEM KNOW.  I NOTIFIED [place] OF THIS AT OFFICE AND THAT PHONE INTERVIEW WAS NOT DONE AND ONCE PT GETS RESCHEDULED SHE WILL NEED TO BE ADDED BACK ON FOR A PHONE INTERVIEW

## 2019-01-13 ENCOUNTER — Telehealth: Payer: Self-pay

## 2019-01-13 NOTE — Telephone Encounter (Addendum)
I called and spoke with the patient and she said that she will not be able to have her surgery done on 01/18/19 at [facility] with Dr Rosana Hoes. She will call back to reschedule once she has time off of work. The OR has been notified and the case has been placed in the depot.

## 2019-01-13 NOTE — Telephone Encounter (Signed)
-----   Message from Dominga Ferry, [place] sent at 01/12/2019  5:07 PM EST ----- Per Nira Conn in Pre-admit, she contacted patient to do a phone interview today and patient said she wasn't going to have surgery as scheduled. Heather did not complete phone interview today. Please reach out to her tomorrow to see if she is going to reschedule. Regardless if she reschedules, she will still need a phone interview. Thanks.

## 2019-01-18 ENCOUNTER — Ambulatory Visit: Admission: RE | Admit: 2019-01-18 | Payer: 59 | Source: Home / Self Care | Admitting: Surgery

## 2019-01-18 ENCOUNTER — Encounter: Admission: RE | Payer: Self-pay | Source: Home / Self Care

## 2019-01-18 SURGERY — EXCISION, SIMPLE PILONIDAL CYST
Anesthesia: General

## 2019-02-10 ENCOUNTER — Ambulatory Visit: Payer: 59 | Admitting: Family Medicine

## 2019-02-10 ENCOUNTER — Encounter: Payer: Self-pay | Admitting: Family Medicine

## 2019-02-10 VITALS — BP 110/60 | HR 109 | Temp 102.7°F | Ht 67.25 in | Wt 296.5 lb

## 2019-02-10 DIAGNOSIS — R509 Fever, unspecified: Secondary | ICD-10-CM | POA: Diagnosis not present

## 2019-02-10 DIAGNOSIS — J029 Acute pharyngitis, unspecified: Secondary | ICD-10-CM

## 2019-02-10 LAB — POC INFLUENZA A&B (BINAX/QUICKVUE)
Influenza A, POC: NEGATIVE
Influenza B, POC: NEGATIVE

## 2019-02-10 LAB — POCT RAPID STREP A (OFFICE): Rapid Strep A Screen: NEGATIVE

## 2019-02-10 MED ORDER — AMOXICILLIN 500 MG PO CAPS: 1000.0000 mg | ORAL_CAPSULE | Freq: Two times a day (BID) | ORAL | 0 refills | Status: DC

## 2019-02-10 NOTE — Progress Notes (Signed)
Subjective:    Patient ID: [name], female    DOB: [birth year], 28 y.o.   MRN: [id number]  Emesis   The current episode started today. Associated symptoms include chills, coughing, diarrhea, a fever and myalgias. Risk factors include ill contacts. She has tried increased fluids for the symptoms.  Diarrhea   Associated symptoms include chills, coughing, a fever, myalgias and vomiting.  Cough  This is a new problem. The current episode started in the past 7 days ( 5 days). The problem has been gradually worsening. The cough is productive of sputum. Associated symptoms include chills, a fever, myalgias and a sore throat. Pertinent negatives include no ear congestion, ear pain, nasal congestion, shortness of breath or wheezing. Associated symptoms comments: Chest congestion. The symptoms are aggravated by lying down. Risk factors: non smoker. She has tried OTC cough suppressant for the symptoms. The treatment provided mild relief. Her past medical history is significant for environmental allergies.   Sick contacts: none   Blood pressure 110/60, pulse (!) 109, temperature (!) 102.7 F (39.3 C), temperature source Oral, height 5' 7.25" (1.708 m), weight 296 lb 8 oz (134.5 kg), SpO2 99 %.  Review of Systems  Constitutional: Positive for chills and fever.  HENT: Positive for sore throat. Negative for ear pain.   Respiratory: Positive for cough. Negative for shortness of breath and wheezing.   Gastrointestinal: Positive for diarrhea and vomiting.  Musculoskeletal: Positive for myalgias.  Allergic/Immunologic: Positive for environmental allergies.       Objective:   Physical Exam Constitutional:      General: She is not in acute distress.    Appearance: Normal appearance. She is well-developed. She is ill-appearing. She is not toxic-appearing.  HENT:     Head: Normocephalic.     Right Ear: Hearing, tympanic membrane, ear canal and external ear normal. Tympanic membrane is not  erythematous, retracted or bulging.     Left Ear: Hearing, tympanic membrane, ear canal and external ear normal. Tympanic membrane is not erythematous, retracted or bulging.     Nose: No mucosal edema or rhinorrhea.     Right Sinus: No maxillary sinus tenderness or frontal sinus tenderness.     Left Sinus: No maxillary sinus tenderness or frontal sinus tenderness.     Mouth/Throat:     Pharynx: Uvula midline. Pharyngeal swelling, oropharyngeal exudate and posterior oropharyngeal erythema present. No uvula swelling.     Tonsils: Tonsillar exudate present. No tonsillar abscesses. Swelling: 2+ on the right. 2+ on the left.  Eyes:     General: Lids are normal. Lids are everted, no foreign bodies appreciated.     Conjunctiva/sclera: Conjunctivae normal.     Pupils: Pupils are equal, round, and reactive to light.  Neck:     Musculoskeletal: Normal range of motion and neck supple.     Thyroid: No thyroid mass or thyromegaly.     Vascular: No carotid bruit.     Trachea: Trachea normal.  Cardiovascular:     Rate and Rhythm: Normal rate and regular rhythm.     Pulses: Normal pulses.     Heart sounds: Normal heart sounds, S1 normal and S2 normal. No murmur. No friction rub. No gallop.   Pulmonary:     Effort: Pulmonary effort is normal. No tachypnea or respiratory distress.     Breath sounds: Normal breath sounds. No decreased breath sounds, wheezing, rhonchi or rales.  Abdominal:     General: Bowel sounds are normal.     Palpations:  Abdomen is soft.     Tenderness: There is no abdominal tenderness.  Skin:    General: Skin is warm and dry.     Findings: No rash.  Neurological:     Mental Status: She is alert.  Psychiatric:        Mood and Affect: Mood is not anxious or depressed.        Speech: Speech normal.        Behavior: Behavior normal. Behavior is cooperative.        Thought Content: Thought content normal.        Judgment: Judgment normal.           Assessment & Plan:

## 2019-02-10 NOTE — Patient Instructions (Signed)
Rest, Push fluids.  Ibuprofen 800 mg three times daily for paina nd fever. Complete course of antibiotics for likely strep infection.

## 2019-02-10 NOTE — Assessment & Plan Note (Signed)
Neg Flu test.  Appears of throat most consistent with strep.. rapid negative but will treat empirically with amox x 10 days.

## 2019-03-04 ENCOUNTER — Telehealth: Payer: Self-pay | Admitting: Family Medicine

## 2019-03-04 DIAGNOSIS — Z13220 Encounter for screening for lipoid disorders: Secondary | ICD-10-CM

## 2019-03-04 NOTE — Telephone Encounter (Signed)
-----   Message from Ellamae Sia sent at 02/28/2019  2:15 PM EST ----- Regarding: Lab orders for Monday. 3.9.20 Patient is scheduled for CPX labs, please order future labs, Thanks , Karna Christmas

## 2019-03-06 ENCOUNTER — Other Ambulatory Visit (INDEPENDENT_AMBULATORY_CARE_PROVIDER_SITE_OTHER): Payer: 59

## 2019-03-06 DIAGNOSIS — Z13220 Encounter for screening for lipoid disorders: Secondary | ICD-10-CM | POA: Diagnosis not present

## 2019-03-06 NOTE — Addendum Note (Signed)
Addended by: Ellamae Sia on: 03/06/2019 08:33 AM   Modules accepted: Orders

## 2019-03-07 LAB — COMPREHENSIVE METABOLIC PANEL
ALBUMIN: 4.1 g/dL (ref 3.9–5.0)
ALT: 17 IU/L (ref 0–32)
AST: 19 IU/L (ref 0–40)
Albumin/Globulin Ratio: 1.2 (ref 1.2–2.2)
Alkaline Phosphatase: 106 IU/L (ref 39–117)
BUN/Creatinine Ratio: 9 (ref 9–23)
BUN: 8 mg/dL (ref 6–20)
Bilirubin Total: <0.2 mg/dL (ref 0.0–1.2)
CO2: 23 mmol/L (ref 20–29)
CREATININE: 0.9 mg/dL (ref 0.57–1.00)
Calcium: 9.7 mg/dL (ref 8.7–10.2)
Chloride: 104 mmol/L (ref 96–106)
GFR calc Af Amer: 100 mL/min/{1.73_m2} (ref >59)
GFR calc non Af Amer: 87 mL/min/{1.73_m2} (ref >59)
GLOBULIN, TOTAL: 3.4 g/dL (ref 1.5–4.5)
Glucose: 92 mg/dL (ref 65–99)
Potassium: 4.1 mmol/L (ref 3.5–5.2)
SODIUM: 142 mmol/L (ref 134–144)
Total Protein: 7.5 g/dL (ref 6.0–8.5)

## 2019-03-07 LAB — LIPID PANEL
CHOLESTEROL TOTAL: 180 mg/dL (ref 100–199)
Chol/HDL Ratio: 3.8 ratio (ref 0.0–4.4)
HDL: 47 mg/dL (ref >39)
LDL Calculated: 115 mg/dL — ABNORMAL HIGH (ref 0–99)
Triglycerides: 88 mg/dL (ref 0–149)
VLDL Cholesterol Cal: 18 mg/dL (ref 5–40)

## 2019-03-14 ENCOUNTER — Encounter: Payer: Self-pay | Admitting: Family Medicine

## 2019-03-14 ENCOUNTER — Ambulatory Visit (INDEPENDENT_AMBULATORY_CARE_PROVIDER_SITE_OTHER): Payer: 59 | Admitting: Family Medicine

## 2019-03-14 ENCOUNTER — Other Ambulatory Visit (HOSPITAL_COMMUNITY)
Admission: RE | Admit: 2019-03-14 | Discharge: 2019-03-14 | Disposition: A | Discharge status: Home / Self Care | Payer: 59 | Source: Ambulatory Visit | Attending: Family Medicine | Admitting: Family Medicine

## 2019-03-14 ENCOUNTER — Other Ambulatory Visit: Payer: Self-pay

## 2019-03-14 VITALS — BP 102/60 | HR 86 | Temp 98.5°F | Ht 67.25 in | Wt 288.8 lb

## 2019-03-14 DIAGNOSIS — Z124 Encounter for screening for malignant neoplasm of cervix: Secondary | ICD-10-CM

## 2019-03-14 DIAGNOSIS — Z113 Encounter for screening for infections with a predominantly sexual mode of transmission: Secondary | ICD-10-CM

## 2019-03-14 MED ORDER — CYCLOBENZAPRINE HCL 10 MG PO TABS: 5.0000 mg | ORAL_TABLET | Freq: Every day | ORAL | 1 refills | Status: DC

## 2019-03-14 NOTE — Addendum Note (Signed)
Addended by: Carter Kitten on: 03/14/2019 11:06 AM   Modules accepted: Orders

## 2019-03-14 NOTE — Patient Instructions (Signed)
Keep up the great work on healthy eating and regular exercise to facilitate weight loss!  Please stop at the lab to have labs drawn.

## 2019-03-14 NOTE — Progress Notes (Signed)
Subjective:    Patient ID: [name], female    DOB: January 26, 1991, 28 y.o.   MRN: [id number]  HPI  The patient is here for annual wellness exam and preventative care.    MDD:  Well controlled on no medication. No SI. No HI. Stopped on her own a couple months ago. Depression screen [facility] 2/9 03/14/2019 09/16/2018 07/19/2018  Decreased Interest - 1 2  Down, Depressed, Hopeless 0 1 2  PHQ - 2 Score 0 2 4  Altered sleeping 0 2 1  Tired, decreased energy - 2 1  Change in appetite - 1 1  Feeling bad or failure about yourself  - 2 1  Trouble concentrating - 1 1  Moving slowly or fidgety/restless - 2 2  Suicidal thoughts - 0 0  PHQ-9 Score 0 12 11  Difficult doing work/chores - Somewhat difficult Somewhat difficult  Some recent data might be hidden    Morbid obesity Body mass index is 44.89 kg/m.  Exercise: walking more daily.  Diet: better choices, not eating out, no soda, no caffeine.  Has lost 14 lbs in  Last 3 months. Wt Readings from Last 3 Encounters:  03/14/19 288 lb 12 oz (131 kg)  02/10/19 296 lb 8 oz (134.5 kg)  01/03/19 (!) 302 lb (137 kg)   Reviewed labs in detail.   Doing well on OCPs.  Social History /Family History/Past Medical History reviewed in detail and updated in EMR if needed. Blood pressure 102/60, pulse 86, temperature 98.5 F (36.9 C), temperature source Oral, height 5' 7.25" (1.708 m), weight 288 lb 12 oz (131 kg), last menstrual period 02/21/2019.  Review of Systems  Constitutional: Negative for fatigue and fever.  HENT: Negative for congestion.   Eyes: Negative for pain.  Respiratory: Negative for cough and shortness of breath.   Cardiovascular: Negative for chest pain, palpitations and leg swelling.  Gastrointestinal: Negative for abdominal pain.  Genitourinary: Negative for dysuria and vaginal bleeding.  Musculoskeletal: Negative for back pain.  Neurological: Negative for syncope, light-headedness and headaches.  Psychiatric/Behavioral:  Negative for dysphoric mood.       Objective:   Physical Exam Constitutional:      General: She is not in acute distress.    Appearance: Normal appearance. She is well-developed. She is obese. She is not ill-appearing or toxic-appearing.  HENT:     Head: Normocephalic.     Right Ear: Hearing, tympanic membrane, ear canal and external ear normal.     Left Ear: Hearing, tympanic membrane, ear canal and external ear normal.     Nose: Nose normal.  Eyes:     General: Lids are normal. Lids are everted, no foreign bodies appreciated.     Conjunctiva/sclera: Conjunctivae normal.     Pupils: Pupils are equal, round, and reactive to light.  Neck:     Musculoskeletal: Normal range of motion and neck supple.     Thyroid: No thyroid mass or thyromegaly.     Vascular: No carotid bruit.     Trachea: Trachea normal.  Cardiovascular:     Rate and Rhythm: Normal rate and regular rhythm.     Heart sounds: Normal heart sounds, S1 normal and S2 normal. No murmur. No gallop.   Pulmonary:     Effort: Pulmonary effort is normal. No respiratory distress.     Breath sounds: Normal breath sounds. No wheezing, rhonchi or rales.  Abdominal:     General: Bowel sounds are normal. There is no distension or abdominal  bruit.     Palpations: Abdomen is soft. There is no fluid wave or mass.     Tenderness: There is no abdominal tenderness. There is no guarding or rebound.     Hernia: No hernia is present. There is no hernia in the right inguinal area or left inguinal area.  Genitourinary:    Exam position: Supine.     Pubic Area: No rash.      Labia:        Right: No rash or tenderness.        Left: No rash or tenderness.      Urethra: No prolapse.  Lymphadenopathy:     Cervical: No cervical adenopathy.     Lower Body: No right inguinal adenopathy. No left inguinal adenopathy.  Skin:    General: Skin is warm and dry.     Findings: No rash.  Neurological:     Mental Status: She is alert.     Cranial  Nerves: No cranial nerve deficit.     Sensory: No sensory deficit.  Psychiatric:        Mood and Affect: Mood is not anxious or depressed.        Speech: Speech normal.        Behavior: Behavior normal. Behavior is cooperative.        Judgment: Judgment normal.           Assessment & Plan:  The patient's preventative maintenance and recommended screening tests for an annual wellness exam were reviewed in full today. Brought up to date unless services declined.  Counselled on the importance of diet, exercise, and its role in overall health and mortality. The patient's FH and SH was reviewed, including their home life, tobacco status, and drug and alcohol status.   Vaccines: uptodate Non smoker.  no ETOH, no drugs PAP/DVE:last pap 2016 nml and no HPV, repeat in 3 years. Due now.  STD screening: Will do. No early family history of breast or colon cancer.

## 2019-03-15 LAB — HEPATITIS PANEL, ACUTE
Hep A IgM: NONREACTIVE
Hep B C IgM: NONREACTIVE
Hepatitis B Surface Ag: NONREACTIVE
Hepatitis C Ab: NONREACTIVE
SIGNAL TO CUT-OFF: 0.04 (ref <1.00)

## 2019-03-15 LAB — RPR: RPR Ser Ql: NONREACTIVE

## 2019-03-15 LAB — HIV ANTIBODY (ROUTINE TESTING W REFLEX): HIV 1&2 Ab, 4th Generation: NONREACTIVE

## 2019-03-16 LAB — CYTOLOGY - PAP
Chlamydia: NEGATIVE
Diagnosis: NEGATIVE
NEISSERIA GONORRHEA: NEGATIVE

## 2019-05-18 ENCOUNTER — Other Ambulatory Visit: Payer: Self-pay

## 2019-05-18 ENCOUNTER — Encounter: Payer: 59 | Attending: Physician Assistant | Admitting: Physician Assistant

## 2019-05-18 DIAGNOSIS — L0501 Pilonidal cyst with abscess: Secondary | ICD-10-CM | POA: Insufficient documentation

## 2019-05-18 NOTE — Progress Notes (Signed)
[name], [name] ([id number]) Visit Report for 05/18/2019 Chief Complaint Document Details Patient Name: [name], [name]. Date of Service: 05/18/2019 1:15 PM Medical Record Number: [id number] Patient Account Number: [id number] Date of Birth/Sex: Oct 16, 1991 (28 y.o. F) Treating RN: Cornell Barman Primary Care Provider: Eliezer Lofts Other Clinician: Referring Provider: Shirlee More Treating Provider/Extender: Melburn Hake, HOYT Weeks in Treatment: 0 Information Obtained from: Patient Chief Complaint Possible pilonidal cyst in the right gluteal/sacral region Electronic Signature(s) Signed: 05/18/2019 5:22:21 PM By: Worthy Keeler PA-C Entered By: Worthy Keeler on 05/18/2019 17:18:56 [name], [name] ([id number]) -------------------------------------------------------------------------------- HPI Details Patient Name: [name] Date of Service: 05/18/2019 1:15 PM Medical Record Number: [id number] Patient Account Number: [id number] Date of Birth/Sex: 09-Jul-1991 (28 y.o. F) Treating RN: Cornell Barman Primary Care Provider: Eliezer Lofts Other Clinician: Referring Provider: Shirlee More Treating Provider/Extender: Melburn Hake, HOYT Weeks in Treatment: 0 History of Present Illness HPI Description: 05/18/19 on evaluation today patient actually appears to be doing quite well even though this is the initial inspection here in our office. In the right gluteal region she actually has a area where previously in just the past several days she had an open wound that was draining. This actually appears to be an access that has since closed over and seems to be healing quite nicely. 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 There's no signs of infection, erythema, No fevers, chills, nausea, or vomiting noted at this time. She tells me she's had this  happened several times and is normally for several days it will drain the go away generally the same spot is what she has current. She was referred to a surgeon earlier in the year but she tells me that it was cost prohibitive for her that she see the surgeon. Electronic Signature(s) Signed: 05/18/2019 5:22:21 PM By: Worthy Keeler PA-C Entered By: Worthy Keeler on 05/18/2019 17:19:51 [name], [name] ([id number]) -------------------------------------------------------------------------------- Physical Exam Details Patient Name: [name] Date of Service: 05/18/2019 1:15 PM Medical Record Number: [id number] Patient Account Number: [id number] Date of Birth/Sex: [birth year] (28 y.o. F) Treating RN: Cornell Barman Primary Care Provider: Eliezer Lofts Other Clinician: Referring Provider: Shirlee More Treating Provider/Extender: Melburn Hake, HOYT Weeks in Treatment: 0 Constitutional patient is hypertensive.. pulse regular and within target range for patient.Marland Kitchen respirations regular, non-labored and within target range for patient.Marland Kitchen temperature within target range for patient.. Well-nourished and well-hydrated in no acute distress. Eyes conjunctiva clear no eyelid edema noted. pupils equal round and reactive to light and accommodation. Ears, Nose, Mouth, and Throat no gross abnormality of ear auricles or external auditory canals. normal hearing noted during conversation. mucus membranes moist. Respiratory normal breathing without difficulty. clear to auscultation bilaterally. Cardiovascular regular rate and rhythm with normal S1, S2. Gastrointestinal (GI) soft, non-tender, non-distended, +BS. no ventral hernia noted. Musculoskeletal normal gait and posture. no significant deformity or arthritic changes, no loss or range of motion, no clubbing. Psychiatric this patient is able to make decisions and demonstrates good insight into disease process. Alert and Oriented x 3. pleasant and  cooperative. Notes Upon evaluation today I do see an area of scar tissue where the patient appears that in the current issue she tells me with the pilonidal cyst. With that being said I do not see any signs of fluctuance, erythema, or even really pain at  this time which is good news. I do think that this though it was previously present seems to be doing much better at this time. She still on antibiotics for another three days. Electronic Signature(s) Signed: 05/18/2019 5:22:21 PM By: Worthy Keeler PA-C Entered By: Worthy Keeler on 05/18/2019 17:20:22 [name], [name] ([id number]) -------------------------------------------------------------------------------- Physician Orders Details Patient Name: [name] Date of Service: 05/18/2019 1:15 PM Medical Record Number: [id number] Patient Account Number: [id number] Date of Birth/Sex: 1991-02-06 (28 y.o. F) Treating RN: Cornell Barman Primary Care Provider: Eliezer Lofts Other Clinician: Referring Provider: Shirlee More Treating Provider/Extender: Melburn Hake, HOYT Weeks in Treatment: 0 Verbal / Phone Orders: No Diagnosis Coding Wound Cleansing o Other: - Wash with hibaclense (anti-bacerial wash) everyday for 1 week then once a week thereafter. Follow-up Appointments o Other: - Only if needed. Off-Loading o Other: - Stand up periodically to shift weight and allow good blood flow. Medications-please add to medication list. o P.O. Antibiotics - Complete antibiotics Discharge From [facility] Services o Discharge from [place] Signature(s) Signed: 05/18/2019 4:50:49 PM By: Gretta Cool, BSN, RN, CWS, Kim RN, BSN Signed: 05/18/2019 5:22:21 PM By: Worthy Keeler PA-C Entered By: Gretta Cool BSN, RN, CWS, Kim on 05/18/2019 13:44:51 [name], [name] ([id number]) -------------------------------------------------------------------------------- Problem List Details Patient Name: [name] Date of  Service: 05/18/2019 1:15 PM Medical Record Number: [id number] Patient Account Number: [id number] Date of Birth/Sex: [birth year] (28 y.o. F) Treating RN: Cornell Barman Primary Care Provider: Eliezer Lofts Other Clinician: Referring Provider: Shirlee More Treating Provider/Extender: Melburn Hake, HOYT Weeks in Treatment: 0 Active Problems ICD-10 Evaluated Encounter Code Description Active Date Today Diagnosis L05.01 Pilonidal cyst with abscess 05/18/2019 No Yes Inactive Problems Resolved Problems Electronic Signature(s) Signed: 05/18/2019 5:22:21 PM By: Worthy Keeler PA-C Entered By: Worthy Keeler on 05/18/2019 17:18:14 Lamagna, [name] ([id number]) -------------------------------------------------------------------------------- Progress Note Details Patient Name: [name] Date of Service: 05/18/2019 1:15 PM Medical Record Number: [id number] Patient Account Number: [id number] Date of Birth/Sex: [birth year] (28 y.o. F) Treating RN: Cornell Barman Primary Care Provider: Eliezer Lofts Other Clinician: Referring Provider: Shirlee More Treating Provider/Extender: Melburn Hake, HOYT Weeks in Treatment: 0 Subjective Chief Complaint Information obtained from Patient Possible pilonidal cyst in the right gluteal/sacral region History of Present Illness (HPI) 05/18/19 on evaluation today patient actually appears to be doing quite well even though this is the initial inspection here in our office. In the right gluteal region she actually has a area where previously in just the past several days she had an open wound that was draining. This actually appears to be an access that has since closed over and seems to be healing quite nicely. 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 There's no signs of infection, erythema, No fevers, chills, nausea,  or vomiting noted at this time. She tells me she's had this happened several times and is normally for several days it will drain the go away generally the same spot is what she has current. She was referred to a surgeon earlier in the year but she tells me that it was cost prohibitive for her that she see the surgeon. Patient History Information obtained from Patient. Allergies No Known Drug Allergies Social History Never smoker, Alcohol Use - Never, Drug Use - No History, Caffeine Use - Daily. Medical History Eyes  Denies history of Cataracts, Glaucoma Ear/Nose/Mouth/Throat Denies history of Chronic sinus problems/congestion, Middle ear problems Hematologic/Lymphatic Denies history of Anemia, Hemophilia, Human Immunodeficiency Virus, Lymphedema, Sickle Cell Disease Cardiovascular Denies history of Angina, Arrhythmia, Congestive Heart Failure, Coronary Artery Disease, Deep Vein Thrombosis, Hypertension, Hypotension, Myocardial Infarction, Peripheral Arterial Disease, Peripheral Venous Disease, Phlebitis, Vasculitis Gastrointestinal Denies history of Cirrhosis , Colitis, Crohn s, Hepatitis A, Hepatitis B, Hepatitis C Endocrine Denies history of Type I Diabetes, Type II Diabetes Genitourinary Denies history of End Stage Renal Disease Immunological Denies history of Lupus Erythematosus, Raynaud s, Scleroderma Integumentary (Skin) Denies history of History of Burn, History of pressure wounds Musculoskeletal Denies history of Gout, Rheumatoid Arthritis, Osteoarthritis, Osteomyelitis Neurologic Denies history of Dementia, Neuropathy, Quadriplegia, Paraplegia, Seizure Disorder Oncologic Denies history of Received Chemotherapy, Received Radiation Psychiatric Denies history of Anorexia/bulimia, Confinement Anxiety [name], Sophia N. ([id number]) Review of Systems (ROS) Constitutional Symptoms (General Health) Denies complaints or symptoms of Fatigue, Fever, Chills, Marked Weight  Change. Eyes Denies complaints or symptoms of Dry Eyes, Vision Changes, Glasses / Contacts. Ear/Nose/Mouth/Throat Denies complaints or symptoms of Difficult clearing ears, Sinusitis. Hematologic/Lymphatic Denies complaints or symptoms of Bleeding / Clotting Disorders, Human Immunodeficiency Virus. Cardiovascular Denies complaints or symptoms of Chest pain, LE edema. Gastrointestinal Denies complaints or symptoms of Frequent diarrhea, Nausea, Vomiting. Endocrine Denies complaints or symptoms of Hepatitis, Thyroid disease, Polydypsia (Excessive Thirst). Genitourinary Denies complaints or symptoms of Kidney failure/ Dialysis, Incontinence/dribbling. Immunological Denies complaints or symptoms of Hives, Itching. Integumentary (Skin) Denies complaints or symptoms of Wounds, Bleeding or bruising tendency, Breakdown, Swelling. Musculoskeletal Denies complaints or symptoms of Muscle Pain, Muscle Weakness. Neurologic Denies complaints or symptoms of Numbness/parasthesias, Focal/Weakness. Psychiatric Denies complaints or symptoms of Anxiety, Claustrophobia. Objective Constitutional patient is hypertensive.. pulse regular and within target range for patient.Marland Kitchen respirations regular, non-labored and within target range for patient.Marland Kitchen temperature within target range for patient.. Well-nourished and well-hydrated in no acute distress. Vitals Time Taken: 1:20 PM, Height: 67 in, Source: Stated, Weight: 300 lbs, Source: Stated, BMI: 47, Temperature: 98.6 F, Pulse: 98 bpm, Respiratory Rate: 16 breaths/min, Blood Pressure: 147/81 mmHg. Eyes conjunctiva clear no eyelid edema noted. pupils equal round and reactive to light and accommodation. Ears, Nose, Mouth, and Throat no gross abnormality of ear auricles or external auditory canals. normal hearing noted during conversation. mucus membranes moist. Respiratory normal breathing without difficulty. clear to auscultation  bilaterally. Cardiovascular regular rate and rhythm with normal S1, S2. Gastrointestinal (GI) soft, non-tender, non-distended, +BS. no ventral hernia noted. Musculoskeletal normal gait and posture. no significant deformity or arthritic changes, no loss or range of motion, no clubbing. Psychiatric this patient is able to make decisions and demonstrates good insight into disease process. Alert and Oriented x 3. pleasant and cooperative. [name], [name] ([id number]) General Notes: Upon evaluation today I do see an area of scar tissue where the patient appears that in the current issue she tells me with the pilonidal cyst. With that being said I do not see any signs of fluctuance, erythema, or even really pain at this time which is good news. I do think that this though it was previously present seems to be doing much better at this time. She still on antibiotics for another three days. Other Condition(s) Patient presents with Other Dermatologic Condition located on the Right Gluteus. General Notes: pilonidal cyst with abscess - not open Assessment Active Problems ICD-10 Pilonidal cyst with abscess Plan Wound Cleansing: Other: - Wash with hibaclense (anti-bacerial wash) everyday for 1 week then once a  week thereafter. Follow-up Appointments: Other: - Only if needed. Off-Loading: Other: - Stand up periodically to shift weight and allow good blood flow. Medications-please add to medication list.: P.O. Antibiotics - Complete antibiotics Discharge From [facility] Services: Discharge from [address] My suggestion currently is that the patient continue and complete her antibiotic therapy and then once that is complete I think she will be fine to go ahead and discontinue the need for any oral antibiotics. I did make some recommendations for washing with Hibiclense along with other measures to attempt to prevent this from reoccurring. Please see above for specifics. With that  being said if anything changes or worsens of she's come back into reevaluation patient will contact the office and let us know. Otherwise we will see were things stand at that point hopefully she will not have this continue to occur. Electronic Signature(s) Signed: 05/18/2019 5:22:21 PM By: Worthy Keeler PA-C Entered By: Worthy Keeler on 05/18/2019 17:21:45 [name], [name] ([id number]) -------------------------------------------------------------------------------- ROS/PFSH Details Patient Name: [name] Date of Service: 05/18/2019 1:15 PM Medical Record Number: [id number] Patient Account Number: [id number] Date of Birth/Sex: 1991-09-10 (28 y.o. F) Treating RN: Harold Barban Primary Care Provider: Eliezer Lofts Other Clinician: Referring Provider: Shirlee More Treating Provider/Extender: Melburn Hake, HOYT Weeks in Treatment: 0 Information Obtained From Patient Constitutional Symptoms (General Health) Complaints and Symptoms: Negative for: Fatigue; Fever; Chills; Marked Weight Change Eyes Complaints and Symptoms: Negative for: Dry Eyes; Vision Changes; Glasses / Contacts Medical History: Negative for: Cataracts; Glaucoma Ear/Nose/Mouth/Throat Complaints and Symptoms: Negative for: Difficult clearing ears; Sinusitis Medical History: Negative for: Chronic sinus problems/congestion; Middle ear problems Hematologic/Lymphatic Complaints and Symptoms: Negative for: Bleeding / Clotting Disorders; Human Immunodeficiency Virus Medical History: Negative for: Anemia; Hemophilia; Human Immunodeficiency Virus; Lymphedema; Sickle Cell Disease Cardiovascular Complaints and Symptoms: Negative for: Chest pain; LE edema Medical History: Negative for: Angina; Arrhythmia; Congestive Heart Failure; Coronary Artery Disease; Deep Vein Thrombosis; Hypertension; Hypotension; Myocardial Infarction; Peripheral Arterial Disease; Peripheral Venous Disease;  Phlebitis; Vasculitis Gastrointestinal Complaints and Symptoms: Negative for: Frequent diarrhea; Nausea; Vomiting Medical History: Negative for: Cirrhosis ; Colitis; Crohnos; Hepatitis A; Hepatitis B; Hepatitis C Endocrine Complaints and Symptoms: Negative for: Hepatitis; Thyroid disease; Polydypsia (Excessive Thirst) Medical History: Negative for: Type I Diabetes; Type II Diabetes Genitourinary [name], Sophia N. ([id number]) Complaints and Symptoms: Negative for: Kidney failure/ Dialysis; Incontinence/dribbling Medical History: Negative for: End Stage Renal Disease Immunological Complaints and Symptoms: Negative for: Hives; Itching Medical History: Negative for: Lupus Erythematosus; Raynaudos; Scleroderma Integumentary (Skin) Complaints and Symptoms: Negative for: Wounds; Bleeding or bruising tendency; Breakdown; Swelling Medical History: Negative for: History of Burn; History of pressure wounds Musculoskeletal Complaints and Symptoms: Negative for: Muscle Pain; Muscle Weakness Medical History: Negative for: Gout; Rheumatoid Arthritis; Osteoarthritis; Osteomyelitis Neurologic Complaints and Symptoms: Negative for: Numbness/parasthesias; Focal/Weakness Medical History: Negative for: Dementia; Neuropathy; Quadriplegia; Paraplegia; Seizure Disorder Psychiatric Complaints and Symptoms: Negative for: Anxiety; Claustrophobia Medical History: Negative for: Anorexia/bulimia; Confinement Anxiety Oncologic Medical History: Negative for: Received Chemotherapy; Received Radiation Immunizations Implantable Devices None Family and Social History Never smoker; Alcohol Use: Never; Drug Use: No History; Caffeine Use: Daily; Financial Concerns: No; Food, Clothing or Shelter Needs: No; Support System Lacking: No; Transportation Concerns: No Electronic Signature(s) Signed: 05/18/2019 2:09:52 PM By: Harold Barban Signed: 05/18/2019 5:22:21 PM By: Worthy Keeler PA-C Entered By:  Harold Barban on 05/18/2019 13:27:16 Viviani, [name] ([id number]) [name], CLANTON ([id number]) -------------------------------------------------------------------------------- SuperBill Details Patient Name: [name] Date of Service: 05/18/2019 Medical  Record Number: [id number] Patient Account Number: [id number] Date of Birth/Sex: August 09, 1991 (28 y.o. F) Treating RN: Cornell Barman Primary Care Provider: Eliezer Lofts Other Clinician: Referring Provider: Shirlee More Treating Provider/Extender: Melburn Hake, HOYT Weeks in Treatment: 0 Diagnosis Coding ICD-10 Codes Code Description L05.01 Pilonidal cyst with abscess Facility Procedures CPT4 Code: [id number] Description: [phone] - WOUND CARE VISIT-LEV 2 EST PT Modifier: Quantity: 1 Physician Procedures CPT4 Code: [id number] Description: WC PHYS LEVEL 3 o NEW PT ICD-10 Diagnosis Description L05.01 Pilonidal cyst with abscess Modifier: Quantity: 1 Electronic Signature(s) Signed: 05/18/2019 5:22:21 PM By: Worthy Keeler PA-C Entered By: Worthy Keeler on 05/18/2019 17:21:52

## 2019-05-18 NOTE — Progress Notes (Signed)
RAYLEIGH, GILLYARD ([id number]) Visit Report for 05/18/2019 Abuse/Suicide Risk Screen Details Patient Name: [name], [name]. Date of Service: 05/18/2019 1:15 PM Medical Record Number: [id number] Patient Account Number: [id number] Date of Birth/Sex: Apr 09, 1991 (28 y.o. F) Treating RN: Harold Barban Primary Care Gurman Ashland: Eliezer Lofts Other Clinician: Referring Mariateresa Batra: Shirlee More Treating Haze Antillon/Extender: Melburn Hake, HOYT Weeks in Treatment: 0 Abuse/Suicide Risk Screen Items Answer ABUSE RISK SCREEN: Has anyone close to you tried to hurt or harm you recentlyo No Do you feel uncomfortable with anyone in your familyo No Has anyone forced you do things that you didnot want to doo No Electronic Signature(s) Signed: 05/18/2019 2:09:52 PM By: Harold Barban Entered By: Harold Barban on 05/18/2019 13:27:26 Prouse, [name] ([id number]) -------------------------------------------------------------------------------- Activities of Daily Living Details Patient Name: [name] Date of Service: 05/18/2019 1:15 PM Medical Record Number: [id number] Patient Account Number: [id number] Date of Birth/Sex: Sep 27, 1991 (28 y.o. F) Treating RN: Harold Barban Primary Care Ky Rumple: Eliezer Lofts Other Clinician: Referring Enio Hornback: Shirlee More Treating Aviyon Hocevar/Extender: Melburn Hake, HOYT Weeks in Treatment: 0 Activities of Daily Living Items Answer Activities of Daily Living (Please select one for each item) Drive Automobile Completely Able Take Medications Completely Able Use Telephone Completely Able Care for Appearance Completely Able Use Toilet Completely Able Bath / Shower Completely Able Dress Self Completely Able Feed Self Completely Able Walk Completely Able Get In / Out Bed Completely Able Housework Completely Able Prepare Meals Completely Able Handle Money Completely Able Shop for Self Completely Able Electronic Signature(s) Signed: 05/18/2019 2:09:52 PM  By: Harold Barban Entered By: Harold Barban on 05/18/2019 13:27:36 Fisher, [name] ([id number]) -------------------------------------------------------------------------------- Education Screening Details Patient Name: [name] Date of Service: 05/18/2019 1:15 PM Medical Record Number: [id number] Patient Account Number: [id number] Date of Birth/Sex: [birth year] (28 y.o. F) Treating RN: Harold Barban Primary Care Aamir Mclinden: Eliezer Lofts Other Clinician: Referring Ansleigh Safer: Shirlee More Treating Haelee Bolen/Extender: Melburn Hake, HOYT Weeks in Treatment: 0 Primary Learner Assessed: Patient Learning Preferences/Education Level/Primary Language Learning Preference: Explanation Highest Education Level: College or Above Preferred Language: English Cognitive Barrier Language Barrier: No Translator Needed: No Memory Deficit: No Emotional Barrier: No Cultural/Religious Beliefs Affecting Medical Care: No Physical Barrier Impaired Vision: No Impaired Hearing: No Decreased Hand dexterity: No Knowledge/Comprehension Knowledge Level: High Comprehension Level: High Ability to understand written High instructions: Ability to understand verbal High instructions: Motivation Anxiety Level: Calm Cooperation: Cooperative Education Importance: Acknowledges Need Interest in Health Problems: Asks Questions Perception: Coherent Willingness to Engage in Self- High Management Activities: Readiness to Engage in Self- High Management Activities: Electronic Signature(s) Signed: 05/18/2019 2:09:52 PM By: Harold Barban Entered By: Harold Barban on 05/18/2019 13:28:04 [name] ([id number]) -------------------------------------------------------------------------------- Fall Risk Assessment Details Patient Name: [name] Date of Service: 05/18/2019 1:15 PM Medical Record Number: [id number] Patient Account Number: [id number] Date of Birth/Sex: Oct 19, 1991 (28  y.o. F) Treating RN: Harold Barban Primary Care Ryan Palermo: Eliezer Lofts Other Clinician: Referring Brylee Berk: Shirlee More Treating Viola Placeres/Extender: Melburn Hake, HOYT Weeks in Treatment: 0 Fall Risk Assessment Items Have you had 2 or more falls in the last 12 monthso 0 No Have you had any fall that resulted in injury in the last 12 monthso 0 No FALLS RISK SCREEN History of falling - immediate or within 3 months 0 No Secondary diagnosis (Do you have 2 or more medical diagnoseso) 0 No Ambulatory aid None/bed rest/wheelchair/nurse 0 No Crutches/cane/walker 0 No Furniture 0 No Intravenous therapy Access/Saline/Heparin Lock 0 No Gait/Transferring Normal/ bed rest/ wheelchair 0 No Weak (  short steps with or without shuffle, stooped but able to lift head while 0 No walking, may seek support from furniture) Impaired (short steps with shuffle, may have difficulty arising from chair, head 0 No down, impaired balance) Mental Status Oriented to own ability 0 Yes Electronic Signature(s) Signed: 05/18/2019 2:09:52 PM By: Harold Barban Entered By: Harold Barban on 05/18/2019 13:28:21 Teall, [name] ([id number]) -------------------------------------------------------------------------------- Foot Assessment Details Patient Name: [name] Date of Service: 05/18/2019 1:15 PM Medical Record Number: [id number] Patient Account Number: [id number] Date of Birth/Sex: 1991-04-20 (28 y.o. F) Treating RN: Harold Barban Primary Care Babbie Dondlinger: Eliezer Lofts Other Clinician: Referring Kimberly Coye: Shirlee More Treating Unknown Schleyer/Extender: Melburn Hake, HOYT Weeks in Treatment: 0 Foot Assessment Items Site Locations + = Sensation present, - = Sensation absent, C = Callus, U = Ulcer R = Redness, W = Warmth, M = Maceration, PU = Pre-ulcerative lesion F = Fissure, S = Swelling, D = Dryness Assessment Right: Left: Other Deformity: No No Prior Foot Ulcer: No No Prior Amputation: No  No Charcot Joint: No No Ambulatory Status: Gait: Electronic Signature(s) Signed: 05/18/2019 2:09:52 PM By: Harold Barban Entered By: Harold Barban on 05/18/2019 13:28:44 Kruer, [name] ([id number]) -------------------------------------------------------------------------------- Nutrition Risk Screening Details Patient Name: [name] Date of Service: 05/18/2019 1:15 PM Medical Record Number: [id number] Patient Account Number: [id number] Date of Birth/Sex: 1991/02/13 (28 y.o. F) Treating RN: Harold Barban Primary Care Lariyah Shetterly: Eliezer Lofts Other Clinician: Referring Nattaly Yebra: Shirlee More Treating Jolena Kittle/Extender: Melburn Hake, HOYT Weeks in Treatment: 0 Height (in): 67 Weight (lbs): 300 Body Mass Index (BMI): 47 Nutrition Risk Screening Items Score Screening NUTRITION RISK SCREEN: I have an illness or condition that made me change the kind and/or amount of 0 No food I eat I eat fewer than two meals per day 0 No I eat few fruits and vegetables, or milk products 0 No I have three or more drinks of beer, liquor or wine almost every day 0 No I have tooth or mouth problems that make it hard for me to eat 0 No I don't always have enough money to buy the food I need 0 No I eat alone most of the time 0 No I take three or more different prescribed or over-the-counter drugs a day 0 No Without wanting to, I have lost or gained 10 pounds in the last six months 0 No I am not always physically able to shop, cook and/or feed myself 0 No Nutrition Protocols Good Risk Protocol Moderate Risk Protocol High Risk Proctocol Risk Level: Good Risk Score: 0 Electronic Signature(s) Signed: 05/18/2019 2:09:52 PM By: Harold Barban Entered By: Harold Barban on 05/18/2019 13:28:29

## 2019-05-18 NOTE — Progress Notes (Signed)
[name] ([id number]) Visit Report for 05/18/2019 Allergy List Details Patient Name: [name], [name]. Date of Service: 05/18/2019 1:15 PM Medical Record Number: [id number] Patient Account Number: [id number] Date of Birth/Sex: 1991-08-26 (28 y.o. F) Treating RN: Harold Barban Primary Care Liese Dizdarevic: Eliezer Lofts Other Clinician: Referring Linkoln Alkire: Shirlee More Treating Kenric Ginger/Extender: STONE III, HOYT Weeks in Treatment: 0 Allergies Active Allergies No Known Drug Allergies Allergy Notes Electronic Signature(s) Signed: 05/18/2019 2:09:52 PM By: Harold Barban Entered By: Harold Barban on 05/18/2019 13:22:48 [name] ([id number]) -------------------------------------------------------------------------------- Arrival Information Details Patient Name: [name] Date of Service: 05/18/2019 1:15 PM Medical Record Number: [id number] Patient Account Number: [id number] Date of Birth/Sex: 11/05/1991 (28 y.o. F) Treating RN: Harold Barban Primary Care Tyara Dassow: Eliezer Lofts Other Clinician: Referring Johndaniel Catlin: Shirlee More Treating Cilicia Borden/Extender: Melburn Hake, HOYT Weeks in Treatment: 0 Visit Information Patient Arrived: Ambulatory Arrival Time: 13:20 Accompanied By: self Transfer Assistance: None Patient Identification Verified: Yes Secondary Verification Process Completed: Yes Electronic Signature(s) Signed: 05/18/2019 2:09:52 PM By: Harold Barban Entered By: Harold Barban on 05/18/2019 13:21:08 [name] ([id number]) -------------------------------------------------------------------------------- Clinic Level of Care Assessment Details Patient Name: [name] Date of Service: 05/18/2019 1:15 PM Medical Record Number: [id number] Patient Account Number: [id number] Date of Birth/Sex: 02/06/1991 (28 y.o. F) Treating RN: Cornell Barman Primary Care Amany Rando: Eliezer Lofts Other Clinician: Referring Zaedyn Covin: Shirlee More Treating Levie Owensby/Extender: Melburn Hake, HOYT Weeks in Treatment: 0 Clinic Level of Care Assessment Items TOOL 2 Quantity Score []  - Use when only an EandM is performed on the INITIAL visit 0 ASSESSMENTS - Nursing Assessment / Reassessment X - General Physical Exam (combine w/ comprehensive assessment (listed just below) when 1 20 performed on new pt. evals) X- 1 25 Comprehensive Assessment (HX, ROS, Risk Assessments, Wounds Hx, etc.) ASSESSMENTS - Wound and Skin Assessment / Reassessment X - Simple Wound Assessment / Reassessment - one wound 1 5 []  - 0 Complex Wound Assessment / Reassessment - multiple wounds []  - 0 Dermatologic / Skin Assessment (not related to wound area) ASSESSMENTS - Ostomy and/or Continence Assessment and Care []  - Incontinence Assessment and Management 0 []  - 0 Ostomy Care Assessment and Management (repouching, etc.) PROCESS - Coordination of Care X - Simple Patient / Family Education for ongoing care 1 15 []  - 0 Complex (extensive) Patient / Family Education for ongoing care []  - 0 Staff obtains Programmer, systems, Records, Test Results / Process Orders []  - 0 Staff telephones HHA, Nursing Homes / Clarify orders / etc []  - 0 Routine Transfer to another Facility (non-emergent condition) []  - 0 Routine Hospital Admission (non-emergent condition) []  - 0 New Admissions / Biomedical engineer / Ordering NPWT, Apligraf, etc. []  - 0 Emergency Hospital Admission (emergent condition) X- 1 10 Simple Discharge Coordination []  - 0 Complex (extensive) Discharge Coordination PROCESS - Special Needs []  - Pediatric / Minor Patient Management 0 []  - 0 Isolation Patient Management []  - 0 Hearing / Language / Visual special needs []  - 0 Assessment of Community assistance (transportation, D/C planning, etc.) []  - 0 Additional assistance / Altered mentation []  - 0 Support Surface(s) Assessment (bed, cushion, seat, etc.) [name], [name]  ([id number]) INTERVENTIONS - Wound Cleansing / Measurement []  - Wound Imaging (photographs - any number of wounds) 0 []  - 0 Wound Tracing (instead of photographs) []  - 0 Simple Wound Measurement - one wound []  - 0 Complex Wound Measurement - multiple wounds []  - 0 Simple Wound Cleansing - one wound []  - 0 Complex Wound  Cleansing - multiple wounds INTERVENTIONS - Wound Dressings []  - Small Wound Dressing one or multiple wounds 0 []  - 0 Medium Wound Dressing one or multiple wounds []  - 0 Large Wound Dressing one or multiple wounds []  - 0 Application of Medications - injection INTERVENTIONS - Miscellaneous []  - External ear exam 0 []  - 0 Specimen Collection (cultures, biopsies, blood, body fluids, etc.) []  - 0 Specimen(s) / Culture(s) sent or taken to Lab for analysis []  - 0 Patient Transfer (multiple staff / Civil Service fast streamer / Similar devices) []  - 0 Simple Staple / Suture removal (25 or less) []  - 0 Complex Staple / Suture removal (26 or more) []  - 0 Hypo / Hyperglycemic Management (close monitor of Blood Glucose) []  - 0 Ankle / Brachial Index (ABI) - do not check if billed separately Has the patient been seen at the hospital within the last three years: Yes Total Score: 75 Level Of Care: New/Established - Level 2 Electronic Signature(s) Signed: 05/18/2019 4:50:49 PM By: Gretta Cool, BSN, RN, CWS, Kim RN, BSN Entered By: Gretta Cool, BSN, RN, CWS, Kim on 05/18/2019 13:45:51 [name] ([id number]) -------------------------------------------------------------------------------- Lower Extremity Assessment Details Patient Name: [name] Date of Service: 05/18/2019 1:15 PM Medical Record Number: [id number] Patient Account Number: [id number] Date of Birth/Sex: 1991-10-17 (28 y.o. F) Treating RN: Harold Barban Primary Care Joash Tony: Eliezer Lofts Other Clinician: Referring Rockney Grenz: Shirlee More Treating Matisse Salais/Extender: Melburn Hake, HOYT Weeks in Treatment:  0 Electronic Signature(s) Signed: 05/18/2019 2:09:52 PM By: Harold Barban Entered By: Harold Barban on 05/18/2019 13:22:03 [name] ([id number]) -------------------------------------------------------------------------------- Multi Wound Chart Details Patient Name: [name] Date of Service: 05/18/2019 1:15 PM Medical Record Number: [id number] Patient Account Number: [id number] Date of Birth/Sex: 18-Apr-1991 (28 y.o. F) Treating RN: Cornell Barman Primary Care Kween Bacorn: Eliezer Lofts Other Clinician: Referring Jenesa Foresta: Shirlee More Treating Charrise Lardner/Extender: Melburn Hake, HOYT Weeks in Treatment: 0 Vital Signs Height(in): 67 Pulse(bpm): 98 Weight(lbs): 300 Blood Pressure(mmHg): 147/81 Body Mass Index(BMI): 47 Temperature(F): 98.6 Respiratory Rate 16 (breaths/min): Wound Assessments Treatment Notes Electronic Signature(s) Signed: 05/18/2019 4:50:49 PM By: Gretta Cool, BSN, RN, CWS, Kim RN, BSN Entered By: Gretta Cool, BSN, RN, CWS, Kim on 05/18/2019 13:39:48 Ines, Orson Johnson ([id number]) -------------------------------------------------------------------------------- [place] Details Patient Name: [name] Date of Service: 05/18/2019 1:15 PM Medical Record Number: [id number] Patient Account Number: [id number] Date of Birth/Sex: 1991/03/18 (28 y.o. F) Treating RN: Cornell Barman Primary Care Shine Mikes: Eliezer Lofts Other Clinician: Referring Abisai Coble: Shirlee More Treating Lacresha Fusilier/Extender: Sharalyn Ink in Treatment: 0 Active Inactive Electronic Signature(s) Signed: 05/18/2019 4:50:49 PM By: Gretta Cool, BSN, RN, CWS, Kim RN, BSN Entered By: Gretta Cool, BSN, RN, CWS, Kim on 05/18/2019 13:39:41 Curington, Orson Johnson ([id number]) -------------------------------------------------------------------------------- Non-Wound Condition Assessment Details Patient Name: [name] Date of Service: 05/18/2019 1:15 PM Medical Record Number:  [id number] Patient Account Number: [id number] Date of Birth/Sex: [birth year] (28 y.o. F) Treating RN: Montey Hora Primary Care Richad Ramsay: Eliezer Lofts Other Clinician: Referring Jahira Swiss: Shirlee More Treating Johnross Nabozny/Extender: Melburn Hake, HOYT Weeks in Treatment: 0 Non-Wound Condition: Condition: Other Dermatologic Condition Location: Gluteus Side: Right Photos Notes pilonidal cyst with abscess - not open Electronic Signature(s) Signed: 05/18/2019 1:32:35 PM By: Montey Hora Previous Signature: 05/18/2019 1:31:40 PM Version By: Montey Hora Entered By: Montey Hora on 05/18/2019 13:32:35 Wiler, Orson Johnson ([id number]) -------------------------------------------------------------------------------- Pain Assessment Details Patient Name: [name] Date of Service: 05/18/2019 1:15 PM Medical Record Number: [id number] Patient Account Number: [id number] Date of Birth/Sex: [birth year] (28 y.o. F) Treating RN: Harold Barban  Primary Care Ventura Leggitt: Eliezer Lofts Other Clinician: Referring Kinsleigh Ludolph: Shirlee More Treating Rhyleigh Grassel/Extender: Melburn Hake, HOYT Weeks in Treatment: 0 Active Problems Location of Pain Severity and Description of Pain Patient Has Paino No Site Locations Pain Management and Medication Current Pain Management: Electronic Signature(s) Signed: 05/18/2019 2:09:52 PM By: Harold Barban Entered By: Harold Barban on 05/18/2019 13:23:11 Corpuz, Orson Johnson ([id number]) -------------------------------------------------------------------------------- Patient/Caregiver Education Details Patient Name: [name] Date of Service: 05/18/2019 1:15 PM Medical Record Number: [id number] Patient Account Number: [id number] Date of Birth/Gender: 1991/08/08 (28 y.o. F) Treating RN: Cornell Barman Primary Care Physician: Eliezer Lofts Other Clinician: Referring Physician: Shirlee More Treating Physician/Extender: Sharalyn Ink in Treatment:  0 Education Assessment Education Provided To: Patient Education Topics Provided Basic Hygiene: Wound/Skin Impairment: Handouts: Caring for Your Ulcer Methods: Demonstration, Explain/Verbal Responses: State content correctly Electronic Signature(s) Signed: 05/18/2019 4:50:49 PM By: Gretta Cool, BSN, RN, CWS, Kim RN, BSN Entered By: Gretta Cool, BSN, RN, CWS, Kim on 05/18/2019 13:46:19 Sweigert, Orson Johnson ([id number]) -------------------------------------------------------------------------------- [place] Details Patient Name: [name] Date of Service: 05/18/2019 1:15 PM Medical Record Number: [id number] Patient Account Number: [id number] Date of Birth/Sex: May 15, 1991 (28 y.o. F) Treating RN: Harold Barban Primary Care Josafat Enrico: Eliezer Lofts Other Clinician: Referring Altariq Goodall: Shirlee More Treating Bralyn Folkert/Extender: Melburn Hake, HOYT Weeks in Treatment: 0 Vital Signs Time Taken: 13:20 Temperature (F): 98.6 Height (in): 67 Pulse (bpm): 98 Source: Stated Respiratory Rate (breaths/min): 16 Weight (lbs): 300 Blood Pressure (mmHg): 147/81 Source: Stated Reference Range: 80 - 120 mg / dl Body Mass Index (BMI): 47 Electronic Signature(s) Signed: 05/18/2019 2:09:52 PM By: Harold Barban Entered By: Harold Barban on 05/18/2019 13:24:44

## 2019-06-27 ENCOUNTER — Encounter: Payer: 59 | Attending: Physician Assistant | Admitting: Physician Assistant

## 2019-06-27 ENCOUNTER — Other Ambulatory Visit: Payer: Self-pay

## 2019-06-27 DIAGNOSIS — L0501 Pilonidal cyst with abscess: Secondary | ICD-10-CM | POA: Insufficient documentation

## 2019-06-27 NOTE — Progress Notes (Signed)
[name] ([id number]) Visit Report for 06/27/2019 Abuse/Suicide Risk Screen Details Patient Name: [name], [name]. Date of Service: 06/27/2019 1:45 PM Medical Record Number: [id number] Patient Account Number: [id number] Date of Birth/Sex: 06/19/1991 (28 y.o. F) Treating RN: Harold Barban Primary Care Alizaya Oshea: Eliezer Lofts Other Clinician: Referring Debbi Strandberg: Shirlee More Treating Savio Albrecht/Extender: Melburn Hake, HOYT Weeks in Treatment: 0 Abuse/Suicide Risk Screen Items Answer ABUSE RISK SCREEN: Has anyone close to you tried to hurt or harm you recentlyo No Do you feel uncomfortable with anyone in your familyo No Has anyone forced you do things that you didnot want to doo No Electronic Signature(s) Signed: 06/27/2019 3:07:20 PM By: Harold Barban Entered By: Harold Barban on 06/27/2019 13:59:11 Miers, [name] ([id number]) -------------------------------------------------------------------------------- Activities of Daily Living Details Patient Name: [name] Date of Service: 06/27/2019 1:45 PM Medical Record Number: [id number] Patient Account Number: [id number] Date of Birth/Sex: 14-Feb-1991 (28 y.o. F) Treating RN: Harold Barban Primary Care Katoria Yetman: Eliezer Lofts Other Clinician: Referring Tianna Baus: Shirlee More Treating Jolon Degante/Extender: Melburn Hake, HOYT Weeks in Treatment: 0 Activities of Daily Living Items Answer Activities of Daily Living (Please select one for each item) Drive Automobile Completely Able Take Medications Completely Able Use Telephone Completely Able Care for Appearance Completely Able Use Toilet Completely Able Bath / Shower Completely Able Dress Self Completely Able Feed Self Completely Able Walk Completely Able Get In / Out Bed Completely Able Housework Completely Able Prepare Meals Completely Able Handle Money Completely Able Shop for Self Completely Able Electronic Signature(s) Signed: 06/27/2019 3:07:20 PM  By: Harold Barban Entered By: Harold Barban on 06/27/2019 13:59:27 Randol, [name] ([id number]) -------------------------------------------------------------------------------- Education Screening Details Patient Name: [name] Date of Service: 06/27/2019 1:45 PM Medical Record Number: [id number] Patient Account Number: [id number] Date of Birth/Sex: 1991-09-08 (28 y.o. F) Treating RN: Harold Barban Primary Care Deloris Moger: Eliezer Lofts Other Clinician: Referring Farid Grigorian: Shirlee More Treating Keondre Markson/Extender: Melburn Hake, HOYT Weeks in Treatment: 0 Primary Learner Assessed: Patient Learning Preferences/Education Level/Primary Language Learning Preference: Explanation Highest Education Level: College or Above Preferred Language: English Cognitive Barrier Language Barrier: No Translator Needed: No Memory Deficit: No Emotional Barrier: No Cultural/Religious Beliefs Affecting Medical Care: No Physical Barrier Impaired Vision: No Impaired Hearing: No Decreased Hand dexterity: No Knowledge/Comprehension Knowledge Level: High Comprehension Level: High Ability to understand written High instructions: Ability to understand verbal High instructions: Motivation Anxiety Level: Calm Cooperation: Cooperative Education Importance: Acknowledges Need Interest in Health Problems: Asks Questions Perception: Coherent Willingness to Engage in Self- High Management Activities: Readiness to Engage in Self- High Management Activities: Electronic Signature(s) Signed: 06/27/2019 3:07:20 PM By: Harold Barban Entered By: Harold Barban on 06/27/2019 13:59:51 Holzschuh, [name] ([id number]) -------------------------------------------------------------------------------- Fall Risk Assessment Details Patient Name: [name] Date of Service: 06/27/2019 1:45 PM Medical Record Number: [id number] Patient Account Number: [id number] Date of Birth/Sex: 15-Oct-1991 (28  y.o. F) Treating RN: Harold Barban Primary Care Nefertari Rebman: Eliezer Lofts Other Clinician: Referring Suann Klier: Shirlee More Treating Lathaniel Legate/Extender: Melburn Hake, HOYT Weeks in Treatment: 0 Fall Risk Assessment Items Have you had 2 or more falls in the last 12 monthso 0 No Have you had any fall that resulted in injury in the last 12 monthso 0 No FALLS RISK SCREEN History of falling - immediate or within 3 months 0 No Secondary diagnosis (Do you have 2 or more medical diagnoseso) 0 No Ambulatory aid None/bed rest/wheelchair/nurse 0 No Crutches/cane/walker 0 No Furniture 0 No Intravenous therapy Access/Saline/Heparin Lock 0 No Gait/Transferring Normal/ bed rest/ wheelchair 0 No Weak (  short steps with or without shuffle, stooped but able to lift head while 0 No walking, may seek support from furniture) Impaired (short steps with shuffle, may have difficulty arising from chair, head 0 No down, impaired balance) Mental Status Oriented to own ability 0 Yes Electronic Signature(s) Signed: 06/27/2019 3:07:20 PM By: Harold Barban Entered By: Harold Barban on 06/27/2019 14:00:08 Twedt, [name] ([id number]) -------------------------------------------------------------------------------- Foot Assessment Details Patient Name: [name] Date of Service: 06/27/2019 1:45 PM Medical Record Number: [id number] Patient Account Number: [id number] Date of Birth/Sex: 1991/04/02 (28 y.o. F) Treating RN: Harold Barban Primary Care Laasya Peyton: Eliezer Lofts Other Clinician: Referring [place] Oborn: Shirlee More Treating Rye Dorado/Extender: Melburn Hake, HOYT Weeks in Treatment: 0 Foot Assessment Items Site Locations + = Sensation present, - = Sensation absent, C = Callus, U = Ulcer R = Redness, W = Warmth, M = Maceration, PU = Pre-ulcerative lesion F = Fissure, S = Swelling, D = Dryness Assessment Right: Left: Other Deformity: No No Prior Foot Ulcer: No No Prior Amputation: No  No Charcot Joint: No No Ambulatory Status: Ambulatory Without Help Gait: Steady Electronic Signature(s) Signed: 06/27/2019 3:07:20 PM By: Harold Barban Entered By: Harold Barban on 06/27/2019 14:00:32 Martorana, [name] ([id number]) -------------------------------------------------------------------------------- Nutrition Risk Screening Details Patient Name: [name] Date of Service: 06/27/2019 1:45 PM Medical Record Number: [id number] Patient Account Number: [id number] Date of Birth/Sex: [birth year] (28 y.o. F) Treating RN: Harold Barban Primary Care Denelda Akerley: Eliezer Lofts Other Clinician: Referring Geordan Xu: Shirlee More Treating Anona Giovannini/Extender: Melburn Hake, HOYT Weeks in Treatment: 0 Height (in): 67 Weight (lbs): 300 Body Mass Index (BMI): 47 Nutrition Risk Screening Items Score Screening NUTRITION RISK SCREEN: I have an illness or condition that made me change the kind and/or amount of 0 No food I eat I eat fewer than two meals per day 0 No I eat few fruits and vegetables, or milk products 0 No I have three or more drinks of beer, liquor or wine almost every day 0 No I have tooth or mouth problems that make it hard for me to eat 0 No I don't always have enough money to buy the food I need 0 No I eat alone most of the time 0 No I take three or more different prescribed or over-the-counter drugs a day 0 No Without wanting to, I have lost or gained 10 pounds in the last six months 0 No I am not always physically able to shop, cook and/or feed myself 0 No Nutrition Protocols Good Risk Protocol Moderate Risk Protocol High Risk Proctocol Risk Level: Good Risk Score: 0 Electronic Signature(s) Signed: 06/27/2019 3:07:20 PM By: Harold Barban Entered By: Harold Barban on 06/27/2019 14:00:15

## 2019-06-28 ENCOUNTER — Other Ambulatory Visit
Admission: RE | Admit: 2019-06-28 | Discharge: 2019-06-28 | Disposition: A | Discharge status: Home / Self Care | Payer: 59 | Source: Ambulatory Visit | Attending: Physician Assistant | Admitting: Physician Assistant

## 2019-06-28 DIAGNOSIS — B999 Unspecified infectious disease: Secondary | ICD-10-CM | POA: Insufficient documentation

## 2019-06-28 NOTE — Progress Notes (Addendum)
ROBERTTA, HALFHILL ([id number]) Visit Report for 06/27/2019 Chief Complaint Document Details Patient Name: [name], [name]. Date of Service: 06/27/2019 1:45 PM Medical Record Number: [id number] Patient Account Number: [id number] Date of Birth/Sex: [birth year] (28 y.o. F) Treating RN: Montey Hora Primary Care Provider: Eliezer Lofts Other Clinician: Referring Provider: Eliezer Lofts Treating Provider/Extender: Melburn Hake, Karyn Brull Weeks in Treatment: 0 Information Obtained from: Patient Chief Complaint Possible pilonidal cyst in the right gluteal/sacral region Electronic Signature(s) Signed: 06/28/2019 12:14:37 AM By: Worthy Keeler PA-C Entered By: Worthy Keeler on 06/27/2019 14:45:16 Degrasse, [name] ([id number]) -------------------------------------------------------------------------------- HPI Details Patient Name: [name] Date of Service: 06/27/2019 1:45 PM Medical Record Number: [id number] Patient Account Number: [id number] Date of Birth/Sex: 1991-08-03 (28 y.o. F) Treating RN: Montey Hora Primary Care Provider: Eliezer Lofts Other Clinician: Referring Provider: Eliezer Lofts Treating Provider/Extender: Melburn Hake, Raford Brissett Weeks in Treatment: 0 History of Present Illness HPI Description: 05/18/19 on evaluation today patient actually appears to be doing quite well even though this is the initial inspection here in our office. In the right gluteal region she actually has a area where previously in just the past several days she had an open wound that was draining. This actually appears to be an access that has since closed over and seems to be healing quite nicely. 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 There's no signs of infection, erythema, No fevers, chills, nausea, or vomiting noted at this time. She tells me she's had this  happened several times and is normally for several days it will drain the go away generally the same spot is what she has current. She was referred to a surgeon earlier in the year but she tells me that it was cost prohibitive for her that she see the surgeon. 06/27/19 patient presents today for reevaluation not previously seen her in the office due to issues she had what was thought to be a pilonidal cyst. Subsequently not have she saw musician actually pretty much improved not to solve with the counselor this was a little over a month ago. Subsequently she presents back again today with an apparent issue in the exact same location. She is concerned about was needed to be drained.oothat is what had to be dine before. No fevers, chills, nausea, or vomiting noted at this time. Electronic Signature(s) Signed: 06/28/2019 12:14:37 AM By: Worthy Keeler PA-C Entered By: Worthy Keeler on 06/27/2019 23:32:47 [name], [name] ([id number]) -------------------------------------------------------------------------------- Incision and Drainage Details Patient Name: [name] Date of Service: 06/27/2019 1:45 PM Medical Record Number: [id number] Patient Account Number: [id number] Date of Birth/Sex: 1991/04/30 (28 y.o. F) Treating RN: Montey Hora Primary Care Provider: Eliezer Lofts Other Clinician: Referring Provider: Eliezer Lofts Treating Provider/Extender: Melburn Hake, Mataya Kilduff Weeks in Treatment: 0 Incision And Drainage Wound #1 Right Sacrum Performed for: Performed By: Physician STONE III, Sakib Noguez E., PA-C Incision And Drainage Abscess Type: Location: sacrum Level of Consciousness Awake and Alert (Pre-procedure): Pre-procedure Yes - 14:59 Verification/Time Out Taken: Pain Control: Lidocaine Injectable Drainage Of: Purulent, Sanguineous Instrument: Blade Bleeding: Minimum Hemostasis Achieved: Pressure Culture Sent: Swab Procedural Pain: 0 Post Procedural Pain: 0 Response to Treatment:  Procedure was tolerated well Level of Consciousness Awake and Alert (Post-procedure): Post Procedure Diagnosis Same as Pre-procedure Notes 4.56ml injectable lidocaine, post depth 4.3cm Electronic Signature(s) Signed: 06/27/2019 4:41:12 PM By:  Montey Hora Signed: 06/28/2019 12:14:37 AM By: Worthy Keeler PA-C Entered By: Montey Hora on 06/27/2019 15:06:26 Yadao, [name] ([id number]) -------------------------------------------------------------------------------- Physical Exam Details Patient Name: [name] Date of Service: 06/27/2019 1:45 PM Medical Record Number: [id number] Patient Account Number: [id number] Date of Birth/Sex: [birth year] (28 y.o. F) Treating RN: Montey Hora Primary Care Provider: Eliezer Lofts Other Clinician: Referring Provider: Eliezer Lofts Treating Provider/Extender: Melburn Hake, Kenard Morawski Weeks in Treatment: 0 Constitutional Well-nourished and well-hydrated in no acute distress. Eyes conjunctiva clear no eyelid edema noted. pupils equal round and reactive to light and accommodation. Ears, Nose, Mouth, and Throat no gross abnormality of ear auricles or external auditory canals. normal hearing noted during conversation. mucus membranes moist. Respiratory normal breathing without difficulty. clear to auscultation bilaterally. Cardiovascular regular rate and rhythm with normal S1, S2. Gastrointestinal (GI) soft, non-tender, non-distended, +BS. no ventral hernia noted. Musculoskeletal normal gait and posture. no significant deformity or arthritic changes, no loss or range of motion, no clubbing. Psychiatric this patient is able to make decisions and demonstrates good insight into disease process. Alert and Oriented x 3. pleasant and cooperative. Notes Upon inspection today patient's access actually does not appear to be practical training although it looks like the abscess does need to be evacuated. Subsequently she did consent to this and utilizing  a scalpel I did make a roughly 1 cm incision in the central portion of where the abscess appeared to be flux with. I did express a significant amount of purulent drainage and the abscess actually extended down roughly 4.3 cm once opened. She states that already the pressure felt significantly improved post incision and drainage. We did obtain a culture today to send as well for further evaluation in that regard. Electronic Signature(s) Signed: 06/28/2019 12:14:37 AM By: Worthy Keeler PA-C Entered By: Worthy Keeler on 06/27/2019 23:33:18 Shimamoto, [name] ([id number]) -------------------------------------------------------------------------------- Physician Orders Details Patient Name: [name] Date of Service: 06/27/2019 1:45 PM Medical Record Number: [id number] Patient Account Number: [id number] Date of Birth/Sex: [birth year] (28 y.o. F) Treating RN: Montey Hora Primary Care Provider: Eliezer Lofts Other Clinician: Referring Provider: Eliezer Lofts Treating Provider/Extender: Melburn Hake, Bari Leib Weeks in Treatment: 0 Verbal / Phone Orders: No Diagnosis Coding ICD-10 Coding Code Description L05.01 Pilonidal cyst with abscess Wound Cleansing Wound #1 Right Sacrum o Dial antibacterial soap, wash wounds, rinse and pat dry prior to dressing wounds Primary Wound Dressing Wound #1 Right Sacrum o Iodoform packing Gauze Secondary Dressing Wound #1 Right Sacrum o Dry [place] Dressing Change Frequency Wound #1 Right Sacrum o Change dressing every other day. Follow-up Appointments Wound #1 Right Sacrum o Return Appointment in 1 week. Consults o General Surgery Laboratory o Bacteria identified in Wound by Culture (MICRO) oooo LOINC Code: 6295-2 WUXL Convenience Name: Wound culture routine Patient Medications Allergies: No Known Drug Allergies Notifications Medication Indication Start End Bactrim DS 06/27/2019 DOSE 1 - oral 800 mg-160 mg tablet -  1 tablet oral taken 2 times a day for 14 days [name], [name] ([id number]) Electronic Signature(s) Signed: 06/28/2019 5:19:05 PM By: Montey Hora Signed: 07/03/2019 9:34:43 AM By: Worthy Keeler PA-C Previous Signature: 06/27/2019 3:57:07 PM Version By: Worthy Keeler PA-C Entered By: Montey Hora on 06/28/2019 11:39:35 Scalia, [name] ([id number]) -------------------------------------------------------------------------------- Problem List Details Patient Name: [name] Date of Service: 06/27/2019 1:45 PM Medical Record Number: [id number] Patient Account Number: [id number] Date of Birth/Sex: 09/08/1991 (28 y.o. F) Treating RN: Montey Hora Primary Care Provider:  Diona Browner, Amy Other Clinician: Referring Provider: Eliezer Lofts Treating Provider/Extender: Melburn Hake, Jestina Stephani Weeks in Treatment: 0 Active Problems ICD-10 Evaluated Encounter Code Description Active Date Today Diagnosis L05.01 Pilonidal cyst with abscess 06/27/2019 No Yes Inactive Problems Resolved Problems Electronic Signature(s) Signed: 06/28/2019 12:14:37 AM By: Worthy Keeler PA-C Entered By: Worthy Keeler on 06/27/2019 14:45:09 Lao, [name] ([id number]) -------------------------------------------------------------------------------- Progress Note Details Patient Name: [name] Date of Service: 06/27/2019 1:45 PM Medical Record Number: [id number] Patient Account Number: [id number] Date of Birth/Sex: [birth year] (28 y.o. F) Treating RN: Montey Hora Primary Care Provider: Eliezer Lofts Other Clinician: Referring Provider: Eliezer Lofts Treating Provider/Extender: Melburn Hake, Mohmmad Saleeby Weeks in Treatment: 0 Subjective Chief Complaint Information obtained from Patient Possible pilonidal cyst in the right gluteal/sacral region History of Present Illness (HPI) 05/18/19 on evaluation today patient actually appears to be doing quite well even though this is the initial inspection here in  our office. In the right gluteal region she actually has a area where previously in just the past several days she had an open wound that was draining. This actually appears to be an access that has since closed over and seems to be healing quite nicely. 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 There's no signs of infection, erythema, No fevers, chills, nausea, or vomiting noted at this time. She tells me she's had this happened several times and is normally for several days it will drain the go away generally the same spot is what she has current. She was referred to a surgeon earlier in the year but she tells me that it was cost prohibitive for her that she see the surgeon. 06/27/19 patient presents today for reevaluation not previously seen her in the office due to issues she had what was thought to be a pilonidal cyst. Subsequently not have she saw musician actually pretty much improved not to solve with the counselor this was a little over a month ago. Subsequently she presents back again today with an apparent issue in the exact same location. She is concerned about was needed to be drained. that is what had to be dine before. No fevers, chills, nausea, or vomiting noted at this time. Patient History Information obtained from Patient. Allergies No Known Drug Allergies Social History Never smoker, Alcohol Use - Never, Drug Use - No History, Caffeine Use - Daily. Medical History Eyes Denies history of Cataracts, Glaucoma Ear/Nose/Mouth/Throat Denies history of Chronic sinus problems/congestion, Middle ear problems Hematologic/Lymphatic Denies history of Anemia, Hemophilia, Human Immunodeficiency Virus, Lymphedema, Sickle Cell Disease Cardiovascular Denies history of Angina, Arrhythmia, Congestive Heart Failure, Coronary Artery Disease, Deep  Vein Thrombosis, Hypertension, Hypotension, Myocardial Infarction, Peripheral Arterial Disease, Peripheral Venous Disease, Phlebitis, Vasculitis Gastrointestinal Denies history of Cirrhosis , Colitis, Crohn s, Hepatitis A, Hepatitis B, Hepatitis C Endocrine Denies history of Type I Diabetes, Type II Diabetes Buczek, [name] ([id number]) Genitourinary Denies history of End Stage Renal Disease Immunological Denies history of Lupus Erythematosus, Raynaud s, Scleroderma Integumentary (Skin) Denies history of History of Burn, History of pressure wounds Musculoskeletal Denies history of Gout, Rheumatoid Arthritis, Osteoarthritis, Osteomyelitis Neurologic Denies history of Dementia, Neuropathy, Quadriplegia, Paraplegia, Seizure Disorder Oncologic Denies history of Received Chemotherapy, Received Radiation Psychiatric Denies history of Anorexia/bulimia, Confinement Anxiety Review of Systems (ROS) Eyes Denies complaints or symptoms of Dry Eyes, Vision Changes, Glasses / Contacts.  Ear/Nose/Mouth/Throat Denies complaints or symptoms of Difficult clearing ears, Sinusitis. Hematologic/Lymphatic Denies complaints or symptoms of Bleeding / Clotting Disorders, Human Immunodeficiency Virus. Respiratory Denies complaints or symptoms of Chronic or frequent coughs, Shortness of Breath. Cardiovascular Denies complaints or symptoms of Chest pain, LE edema. Gastrointestinal Denies complaints or symptoms of Frequent diarrhea, Nausea, Vomiting. Endocrine Denies complaints or symptoms of Hepatitis, Thyroid disease, Polydypsia (Excessive Thirst). Genitourinary Denies complaints or symptoms of Kidney failure/ Dialysis, Incontinence/dribbling. Immunological Denies complaints or symptoms of Hives, Itching. Integumentary (Skin) Denies complaints or symptoms of Wounds, Bleeding or bruising tendency, Breakdown, Swelling. Musculoskeletal Denies complaints or symptoms of Muscle Pain, Muscle  Weakness. Neurologic Denies complaints or symptoms of Numbness/parasthesias, Focal/Weakness. Psychiatric Denies complaints or symptoms of Anxiety, Claustrophobia. Objective Constitutional Well-nourished and well-hydrated in no acute distress. Vitals Time Taken: 1:55 PM, Height: 67 in, Source: Stated, Weight: 300 lbs, Source: Stated, BMI: 47, Temperature: 99.8 F, Pulse: 103 bpm, Respiratory Rate: 18 breaths/min, Blood Pressure: 153/91 mmHg. [name], [name] ([id number]) Eyes conjunctiva clear no eyelid edema noted. pupils equal round and reactive to light and accommodation. Ears, Nose, Mouth, and Throat no gross abnormality of ear auricles or external auditory canals. normal hearing noted during conversation. mucus membranes moist. Respiratory normal breathing without difficulty. clear to auscultation bilaterally. Cardiovascular regular rate and rhythm with normal S1, S2. Gastrointestinal (GI) soft, non-tender, non-distended, +BS. no ventral hernia noted. Musculoskeletal normal gait and posture. no significant deformity or arthritic changes, no loss or range of motion, no clubbing. Psychiatric this patient is able to make decisions and demonstrates good insight into disease process. Alert and Oriented x 3. pleasant and cooperative. General Notes: Upon inspection today patient's access actually does not appear to be practical training although it looks like the abscess does need to be evacuated. Subsequently she did consent to this and utilizing a scalpel I did make a roughly 1 cm incision in the central portion of where the abscess appeared to be flux with. I did express a significant amount of purulent drainage and the abscess actually extended down roughly 4.3 cm once opened. She states that already the pressure felt significantly improved post incision and drainage. We did obtain a culture today to send as well for further evaluation in that regard. Integumentary (Hair,  Skin) Wound #1 status is Open. Original cause of wound was Pimple. The wound is located on the Right Sacrum. The wound measures 0.1cm length x 0.1cm width x 0.1cm depth; 0.008cm^2 area and 0.001cm^3 volume. The wound is limited to skin breakdown. There is no tunneling or undermining noted. There is a none present amount of drainage noted. The wound margin is thickened. There is no granulation within the wound bed. There is no necrotic tissue within the wound bed. Assessment Active Problems ICD-10 Pilonidal cyst with abscess Procedures Wound #1 Pre-procedure diagnosis of Wound #1 is a Pilonidal Cyst located on the Right Sacrum . Abscess incision and drainage was provided by STONE III, Abner Ardis E., PA-C. The skin was cleansed and prepped with anti-septic followed by pain control using Lidocaine Injectable. An incision was made in the sacrum with the following instrument(s): Blade. There was an immediate [name], Sophia N. ([id number]) release of Purulent and Sanguineous fluid. A Minimum amount of bleeding was controlled with Pressure. A time out was conducted at 14:59, prior to the start of the procedure. Swab culture was sent. The procedure was tolerated well with a pain level of 0 throughout and a pain level of 0 following the procedure. Post procedure Diagnosis  Wound #1: Same as Pre-Procedure General Notes: 4.69ml injectable lidocaine, post depth 4.3cm. Plan Wound Cleansing: Wound #1 Right Sacrum: Dial antibacterial soap, wash wounds, rinse and pat dry prior to dressing wounds Primary Wound Dressing: Wound #1 Right Sacrum: Iodoform packing Gauze Secondary Dressing: Wound #1 Right Sacrum: Dry Gauze [place] Dressing Change Frequency: Wound #1 Right Sacrum: Change dressing every other day. Follow-up Appointments: Wound #1 Right Sacrum: Return Appointment in 1 week. Consults ordered were: General Surgery Laboratory ordered were: Wound culture routine The following medication(s)  was prescribed: Bactrim DS oral 800 mg-160 mg tablet 1 1 tablet oral taken 2 times a day for 14 days starting 06/27/2019 At this time I'm gonna go ahead and place the patient on Bactrim DS this was sent to the pharmacy for her. Subsequently will see were things stand at follow-up. If anything changes or worsens in the meantime she will contact the office and let me know. Otherwise my hope is that she will continue to show signs of good improvement now this is able to train we did pack the wound today and subsequently she's gonna need to change this packing at least every other day. She does have her stepmother who is available and who is a nurse that fortunately can do this for her. We will see her back for reevaluation in one week. Please see above for specific wound care orders. We will see patient for re-evaluation in 1 week(s) here in the clinic. If anything worsens or changes patient will contact our office for additional recommendations. Electronic Signature(s) Signed: 07/13/2019 11:33:52 PM By: Worthy Keeler PA-C Previous Signature: 06/28/2019 12:14:37 AM Version By: Worthy Keeler PA-C Entered By: Worthy Keeler on 07/13/2019 23:30:04 [name], [name] ([id number]) -------------------------------------------------------------------------------- ROS/PFSH Details Patient Name: [name] Date of Service: 06/27/2019 1:45 PM Medical Record Number: [id number] Patient Account Number: [id number] Date of Birth/Sex: March 23, 1991 (28 y.o. F) Treating RN: Harold Barban Primary Care Provider: Eliezer Lofts Other Clinician: Referring Provider: Eliezer Lofts Treating Provider/Extender: Melburn Hake, Kendre Sires Weeks in Treatment: 0 Information Obtained From Patient Eyes Complaints and Symptoms: Negative for: Dry Eyes; Vision Changes; Glasses / Contacts Medical History: Negative for: Cataracts; Glaucoma Ear/Nose/Mouth/Throat Complaints and Symptoms: Negative for: Difficult clearing ears;  Sinusitis Medical History: Negative for: Chronic sinus problems/congestion; Middle ear problems Hematologic/Lymphatic Complaints and Symptoms: Negative for: Bleeding / Clotting Disorders; Human Immunodeficiency Virus Medical History: Negative for: Anemia; Hemophilia; Human Immunodeficiency Virus; Lymphedema; Sickle Cell Disease Respiratory Complaints and Symptoms: Negative for: Chronic or frequent coughs; Shortness of Breath Cardiovascular Complaints and Symptoms: Negative for: Chest pain; LE edema Medical History: Negative for: Angina; Arrhythmia; Congestive Heart Failure; Coronary Artery Disease; Deep Vein Thrombosis; Hypertension; Hypotension; Myocardial Infarction; Peripheral Arterial Disease; Peripheral Venous Disease; Phlebitis; Vasculitis Gastrointestinal Complaints and Symptoms: Negative for: Frequent diarrhea; Nausea; Vomiting Medical History: Negative for: Cirrhosis ; Colitis; Crohnos; Hepatitis A; Hepatitis B; Hepatitis C [name], Sophia N. ([id number]) Endocrine Complaints and Symptoms: Negative for: Hepatitis; Thyroid disease; Polydypsia (Excessive Thirst) Medical History: Negative for: Type I Diabetes; Type II Diabetes Genitourinary Complaints and Symptoms: Negative for: Kidney failure/ Dialysis; Incontinence/dribbling Medical History: Negative for: End Stage Renal Disease Immunological Complaints and Symptoms: Negative for: Hives; Itching Medical History: Negative for: Lupus Erythematosus; Raynaudos; Scleroderma Integumentary (Skin) Complaints and Symptoms: Negative for: Wounds; Bleeding or bruising tendency; Breakdown; Swelling Medical History: Negative for: History of Burn; History of pressure wounds Musculoskeletal Complaints and Symptoms: Negative for: Muscle Pain; Muscle Weakness Medical History: Negative for: Gout; Rheumatoid Arthritis; Osteoarthritis; Osteomyelitis  Neurologic Complaints and Symptoms: Negative for: Numbness/parasthesias;  Focal/Weakness Medical History: Negative for: Dementia; Neuropathy; Quadriplegia; Paraplegia; Seizure Disorder Psychiatric Complaints and Symptoms: Negative for: Anxiety; Claustrophobia Medical History: Negative for: Anorexia/bulimia; Confinement Anxiety Oncologic Medical History: Negative for: Received Chemotherapy; Received Radiation [name], [name] ([id number]) Immunizations Pneumococcal Vaccine: Received Pneumococcal Vaccination: No Implantable Devices None Family and Social History Never smoker; Alcohol Use: Never; Drug Use: No History; Caffeine Use: Daily; Financial Concerns: No; Food, Clothing or Shelter Needs: No; Support System Lacking: No; Transportation Concerns: No Electronic Signature(s) Signed: 06/27/2019 3:07:20 PM By: Harold Barban Signed: 06/28/2019 12:14:37 AM By: Worthy Keeler PA-C Entered By: Harold Barban on 06/27/2019 13:59:01 [name], [name] ([id number]) -------------------------------------------------------------------------------- SuperBill Details Patient Name: [name] Date of Service: 06/27/2019 Medical Record Number: [id number] Patient Account Number: [id number] Date of Birth/Sex: Oct 15, 1991 (28 y.o. F) Treating RN: Montey Hora Primary Care Provider: Eliezer Lofts Other Clinician: Referring Provider: Eliezer Lofts Treating Provider/Extender: Melburn Hake, Abram Sax Weeks in Treatment: 0 Diagnosis Coding ICD-10 Codes Code Description L05.01 Pilonidal cyst with abscess Facility Procedures CPT4 Code: 56389373 Description: [facility] VISIT-LEV 3 EST PT Modifier: Quantity: 1 CPT4 Code: [id number] Description: 10061 - IandD ABSCESS-MULTIPLE ICD-10 Diagnosis Description L05.01 Pilonidal cyst with abscess Modifier: Quantity: 1 Physician Procedures CPT4 Code: 5726203 Description: 55974 - WC PHYS LEVEL 4 - EST PT ICD-10 Diagnosis Description L05.01 Pilonidal cyst with abscess Modifier: 25 Quantity: 1 CPT4 Code:  [id number] Description: 64680 - WC PHYS IandD ABSCESS, MULTIPLE ICD-10 Diagnosis Description L05.01 Pilonidal cyst with abscess Modifier: Quantity: 1 Electronic Signature(s) Signed: 07/13/2019 11:33:52 PM By: Worthy Keeler PA-C Previous Signature: 06/28/2019 12:14:37 AM Version By: Worthy Keeler PA-C Entered By: Worthy Keeler on 07/13/2019 23:32:07

## 2019-06-28 NOTE — Progress Notes (Signed)
CHELSEA, PEDRETTI ([id number]) Visit Report for 06/27/2019 Allergy List Details Patient Name: [name], [name]. Date of Service: 06/27/2019 1:45 PM Medical Record Number: [id number] Patient Account Number: [id number] Date of Birth/Sex: 15-Dec-1991 (28 y.o. F) Treating RN: Harold Barban Primary Care Liesl Simons: Eliezer Lofts Other Clinician: Referring Zoei Amison: Shirlee More Treating Renay Crammer/Extender: STONE III, HOYT Weeks in Treatment: 0 Allergies Active Allergies No Known Drug Allergies Allergy Notes Electronic Signature(s) Signed: 06/27/2019 3:07:20 PM By: Harold Barban Entered By: Harold Barban on 06/27/2019 13:57:39 Cawley, [name] ([id number]) -------------------------------------------------------------------------------- Arrival Information Details Patient Name: [name] Date of Service: 06/27/2019 1:45 PM Medical Record Number: [id number] Patient Account Number: [id number] Date of Birth/Sex: 17-Nov-1991 (28 y.o. F) Treating RN: Harold Barban Primary Care Prescilla Monger: Eliezer Lofts Other Clinician: Referring Shamaine Mulkern: Shirlee More Treating Heloise Gordan/Extender: Melburn Hake, HOYT Weeks in Treatment: 0 Visit Information Patient Arrived: Ambulatory Arrival Time: 13:53 Accompanied By: self Transfer Assistance: None Patient Identification Verified: Yes Secondary Verification Process Completed: Yes History Since Last Visit Added or deleted any medications: No Any new allergies or adverse reactions: No Had a fall or experienced change in activities of daily living that may affect risk of falls: No Signs or symptoms of abuse/neglect since last visito No Hospitalized since last visit: No Has Dressing in Place as Prescribed: No Electronic Signature(s) Signed: 06/27/2019 3:07:20 PM By: Harold Barban Entered By: Harold Barban on 06/27/2019 13:54:07 Dellarocco, [name]  ([id number]) -------------------------------------------------------------------------------- Clinic Level of Care Assessment Details Patient Name: [name] Date of Service: 06/27/2019 1:45 PM Medical Record Number: [id number] Patient Account Number: [id number] Date of Birth/Sex: [birth year] (28 y.o. F) Treating RN: Montey Hora Primary Care Terisa Belardo: Eliezer Lofts Other Clinician: Referring Mellonie Guess: Shirlee More Treating Vicci Reder/Extender: Melburn Hake, HOYT Weeks in Treatment: 0 Clinic Level of Care Assessment Items TOOL 1 Quantity Score _0  - Use when EandM and Procedure is performed on INITIAL visit 0 ASSESSMENTS - Nursing Assessment / Reassessment X - General Physical Exam (combine w/ comprehensive assessment (listed just below) when 1 20 performed on new pt. evals) X- 1 25 Comprehensive Assessment (HX, ROS, Risk Assessments, Wounds Hx, etc.) ASSESSMENTS - Wound and Skin Assessment / Reassessment _1  - Dermatologic / Skin Assessment (not related to wound area) 0 ASSESSMENTS - Ostomy and/or Continence Assessment and Care _2  - Incontinence Assessment and Management 0 _3  - 0 Ostomy Care Assessment and Management (repouching, etc.) PROCESS - Coordination of Care X - Simple Patient / Family Education for ongoing care 1 15 _4  - 0 Complex (extensive) Patient / Family Education for ongoing care X- 1 10 Staff obtains Programmer, systems, Records, Test Results / Process Orders _5  - 0 Staff telephones HHA, Nursing Homes / Clarify orders / etc _6  - 0 Routine Transfer to another Facility (non-emergent condition) _7  - 0 Routine Hospital Admission (non-emergent condition) X- 1 15 New Admissions / Biomedical engineer / Ordering NPWT, Apligraf, etc. _8  - 0 Emergency Hospital Admission (emergent condition) PROCESS - Special Needs _9  - Pediatric / Minor Patient Management 0 _10  - 0 Isolation Patient Management _11  - 0 Hearing / Language / Visual special needs _12  - 0 Assessment of  Community assistance (transportation, D/C planning, etc.) _13  - 0 Additional assistance / Altered mentation _14  - 0 Support Surface(s) Assessment (bed, cushion, seat, etc.) Bracamonte, Kristian N. ([id number]) INTERVENTIONS - Miscellaneous _15  - External ear exam 0 _16  - 0 Patient Transfer (multiple staff / Civil Service fast streamer / Similar devices) _17  - 0 Simple Staple / Suture removal (25 or less) _18  - 0 Complex Staple /  Suture removal (26 or more) _0  - 0 Hypo/Hyperglycemic Management (do not check if billed separately) _1  - 0 Ankle / Brachial Index (ABI) - do not check if billed separately Has the patient been seen at the hospital within the last three years: Yes Total Score: 85 Level Of Care: New/Established - Level 3 Electronic Signature(s) Signed: 06/27/2019 4:41:12 PM By: Montey Hora Entered By: Montey Hora on 06/27/2019 15:09:20 Rodriges, [name] ([id number]) -------------------------------------------------------------------------------- Encounter Discharge Information Details Patient Name: [name] Date of Service: 06/27/2019 1:45 PM Medical Record Number: [id number] Patient Account Number: [id number] Date of Birth/Sex: Nov 02, 1991 (28 y.o. F) Treating RN: Montey Hora Primary Care Derian Dimalanta: Eliezer Lofts Other Clinician: Referring Jalessa Peyser: Shirlee More Treating Royetta Probus/Extender: Melburn Hake, HOYT Weeks in Treatment: 0 Encounter Discharge Information Items Post Procedure Vitals Discharge Condition: Stable Temperature (F): 99.8 Ambulatory Status: Ambulatory Pulse (bpm): 103 Discharge Destination: Home Respiratory Rate (breaths/min): 16 Transportation: Private Auto Blood Pressure (mmHg): 153/91 Accompanied By: self Schedule Follow-up Appointment: Yes Clinical Summary of Care: Electronic Signature(s) Signed: 06/27/2019 4:41:12 PM By: Montey Hora Entered By: Montey Hora on 06/27/2019 15:10:55 Rota, [name]  ([id number]) -------------------------------------------------------------------------------- Lower Extremity Assessment Details Patient Name: [name] Date of Service: 06/27/2019 1:45 PM Medical Record Number: [id number] Patient Account Number: [id number] Date of Birth/Sex: 24-Dec-1991 (28 y.o. F) Treating RN: Harold Barban Primary Care Maeghan Canny: Eliezer Lofts Other Clinician: Referring Keala Drum: Shirlee More Treating Raguel Kosloski/Extender: Melburn Hake, HOYT Weeks in Treatment: 0 Electronic Signature(s) Signed: 06/27/2019 3:07:20 PM By: Harold Barban Entered By: Harold Barban on 06/27/2019 14:00:46 Salvetti, [name] ([id number]) -------------------------------------------------------------------------------- Multi Wound Chart Details Patient Name: [name] Date of Service: 06/27/2019 1:45 PM Medical Record Number: [id number] Patient Account Number: [id number] Date of Birth/Sex: May 13, 1991 (28 y.o. F) Treating RN: Montey Hora Primary Care Xariah Silvernail: Eliezer Lofts Other Clinician: Referring Linken Mcglothen: Shirlee More Treating Michol Emory/Extender: Melburn Hake, HOYT Weeks in Treatment: 0 Vital Signs Height(in): 67 Pulse(bpm): 103 Weight(lbs): 300 Blood Pressure(mmHg): 153/91 Body Mass Index(BMI): 47 Temperature(F): 99.8 Respiratory Rate 18 (breaths/min): Photos: [N/A:N/A] Wound Location: Right Sacrum N/A N/A Wounding Event: Pimple N/A N/A Primary Etiology: Pilonidal Cyst N/A N/A Date Acquired: 06/18/2019 N/A N/A Weeks of Treatment: 0 N/A N/A Wound Status: Open N/A N/A Measurements L x W x D 0.1x0.1x0.1 N/A N/A (cm) Area (cm) : 0.008 N/A N/A Volume (cm) : 0.001 N/A N/A Classification: Unclassifiable N/A N/A Exudate Amount: None Present N/A N/A Wound Margin: Thickened N/A N/A Granulation Amount: None Present (0%) N/A N/A Necrotic Amount: None Present (0%) N/A N/A Exposed Structures: Fascia: No N/A N/A Fat Layer (Subcutaneous Tissue) Exposed:  No Tendon: No Muscle: No Joint: No Bone: No Limited to Skin Breakdown Epithelialization: None N/A N/A Treatment Notes Electronic Signature(s) [name], [name] ([id number]) Signed: 06/27/2019 4:41:12 PM By: Montey Hora Entered By: Montey Hora on 06/27/2019 14:49:29 Messimer, [name] ([id number]) -------------------------------------------------------------------------------- Multi-Disciplinary Care Plan Details Patient Name: [name] Date of Service: 06/27/2019 1:45 PM Medical Record Number: [id number] Patient Account Number: [id number] Date of Birth/Sex: 11/23/1991 (28 y.o. F) Treating RN: Montey Hora Primary Care Cheryal Salas: Eliezer Lofts Other Clinician: Referring Aslin Farinas: Shirlee More Treating Ra Pfiester/Extender: Melburn Hake, HOYT Weeks in Treatment: 0 Active Inactive Orientation to the Wound Care Program Nursing Diagnoses: Knowledge deficit related to the wound healing center program Goals: Patient/caregiver will verbalize understanding of the [facility] Program Date Initiated: 06/27/2019 Target Resolution Date: 09/02/2019 Goal Status: Active Interventions: Provide education on orientation to the wound center Notes: Pain, Acute or Chronic Nursing Diagnoses: Pain, acute  or chronic: actual or potential Goals: Patient/caregiver will verbalize comfort level met Date Initiated: 06/27/2019 Target Resolution Date: 09/02/2019 Goal Status: Active Interventions: Complete pain assessment as per visit requirements Notes: Soft Tissue Infection Nursing Diagnoses: Potential for infection: soft tissue Goals: Patient will remain free of wound infection Date Initiated: 06/27/2019 Target Resolution Date: 09/02/2019 Goal Status: Active Interventions: Assess signs and symptoms of infection every visit [name], [name] ([id number]) Notes: Wound/Skin Impairment Nursing Diagnoses: Impaired tissue integrity Goals: Ulcer/skin breakdown will heal within 14  weeks Date Initiated: 06/27/2019 Target Resolution Date: 09/02/2019 Goal Status: Active Interventions: Assess patient/caregiver ability to obtain necessary supplies Assess patient/caregiver ability to perform ulcer/skin care regimen upon admission and as needed Assess ulceration(s) every visit Notes: Electronic Signature(s) Signed: 06/27/2019 4:41:12 PM By: Montey Hora Entered By: Montey Hora on 06/27/2019 14:49:15 Montilla, [name] ([id number]) -------------------------------------------------------------------------------- Pain Assessment Details Patient Name: [name] Date of Service: 06/27/2019 1:45 PM Medical Record Number: [id number] Patient Account Number: [id number] Date of Birth/Sex: 17-Dec-1991 (28 y.o. F) Treating RN: Harold Barban Primary Care Aleia Larocca: Eliezer Lofts Other Clinician: Referring Janos Shampine: Shirlee More Treating Eryca Bolte/Extender: Melburn Hake, HOYT Weeks in Treatment: 0 Active Problems Location of Pain Severity and Description of Pain Patient Has Paino Yes Site Locations Pain Location: Pain in Ulcers Rate the pain. Current Pain Level: 7 Worst Pain Level: 10 Character of Pain Describe the Pain: Aching Pain Management and Medication Current Pain Management: Notes Patient states the wound is radiating pain to bilateral hips Electronic Signature(s) Signed: 06/27/2019 3:07:20 PM By: Harold Barban Entered By: Harold Barban on 06/27/2019 13:55:41 Bunkley, [name] ([id number]) -------------------------------------------------------------------------------- Patient/Caregiver Education Details Patient Name: [name] Date of Service: 06/27/2019 1:45 PM Medical Record Number: [id number] Patient Account Number: [id number] Date of Birth/Gender: October 28, 1991 (28 y.o. F) Treating RN: Montey Hora Primary Care Physician: Eliezer Lofts Other Clinician: Referring Physician: Shirlee More Treating Physician/Extender: Sharalyn Ink in Treatment: 0 Education Assessment Education Provided To: Patient Education Topics Provided Wound/Skin Impairment: Handouts: Other: wound care as ordere Methods: Demonstration, Explain/Verbal Responses: State content correctly Electronic Signature(s) Signed: 06/27/2019 4:41:12 PM By: Montey Hora Entered By: Montey Hora on 06/27/2019 15:09:50 Dingledine, [name] ([id number]) -------------------------------------------------------------------------------- Wound Assessment Details Patient Name: [name] Date of Service: 06/27/2019 1:45 PM Medical Record Number: [id number] Patient Account Number: [id number] Date of Birth/Sex: Aug 16, 1991 (28 y.o. F) Treating RN: Harold Barban Primary Care Daivik Overley: Eliezer Lofts Other Clinician: Referring Kashton Mcartor: Shirlee More Treating Cniyah Sproull/Extender: Melburn Hake, HOYT Weeks in Treatment: 0 Wound Status Wound Number: 1 Primary Etiology: Pilonidal Cyst Wound Location: Right Sacrum Wound Status: Open Wounding Event: Pimple Date Acquired: 06/18/2019 Weeks Of Treatment: 0 Clustered Wound: No Photos Wound Measurements Length: (cm) 0.1 % Reduction in Width: (cm) 0.1 % Reduction in Depth: (cm) 0.1 Epithelializati Area: (cm) 0.008 Tunneling: Volume: (cm) 0.001 Undermining: Area: Volume: on: None No No Wound Description Classification: Unclassifiable Foul Odor After Wound Margin: Thickened Slough/Fibrino Exudate Amount: None Present Cleansing: No Yes Wound Bed Granulation Amount: None Present (0%) Exposed Structure Necrotic Amount: None Present (0%) Fascia Exposed: No Fat Layer (Subcutaneous Tissue) Exposed: No Tendon Exposed: No Muscle Exposed: No Joint Exposed: No Bone Exposed: No Limited to Skin Breakdown Treatment Notes Wound #1 (Right Sacrum) Kershaw, [name] ([id number]) Notes iodoform packing, gauze and telfa Manufacturing systems engineer) Signed: 06/27/2019 3:07:20 PM By: Harold Barban Entered By: Harold Barban on 06/27/2019 14:05:48 Baumgart, [name] ([id number]) -------------------------------------------------------------------------------- Vitals Details Patient Name: [name] Date of Service: 06/27/2019 1:45 PM  Medical Record Number: [id number] Patient Account Number: [id number] Date of Birth/Sex: 04-13-1991 (28 y.o. F) Treating RN: Harold Barban Primary Care Kolbee Stallman: Eliezer Lofts Other Clinician: Referring Viola Placeres: Shirlee More Treating Marvena Tally/Extender: Melburn Hake, HOYT Weeks in Treatment: 0 Vital Signs Time Taken: 13:55 Temperature (F): 99.8 Height (in): 67 Pulse (bpm): 103 Source: Stated Respiratory Rate (breaths/min): 18 Weight (lbs): 300 Blood Pressure (mmHg): 153/91 Source: Stated Reference Range: 80 - 120 mg / dl Body Mass Index (BMI): 47 Electronic Signature(s) Signed: 06/27/2019 3:07:20 PM By: Harold Barban Entered By: Harold Barban on 06/27/2019 13:57:26

## 2019-07-01 LAB — AEROBIC CULTURE? (SUPERFICIAL SPECIMEN)

## 2019-07-01 LAB — AEROBIC CULTURE W GRAM STAIN (SUPERFICIAL SPECIMEN): Gram Stain: NONE SEEN

## 2019-07-04 ENCOUNTER — Ambulatory Visit
Admission: RE | Admit: 2019-07-04 | Discharge: 2019-07-04 | Disposition: A | Discharge status: Home / Self Care | Payer: 59 | Source: Ambulatory Visit | Attending: Physician Assistant | Admitting: Physician Assistant

## 2019-07-04 ENCOUNTER — Encounter: Payer: 59 | Attending: Physician Assistant | Admitting: Physician Assistant

## 2019-07-04 ENCOUNTER — Other Ambulatory Visit: Payer: Self-pay

## 2019-07-04 ENCOUNTER — Other Ambulatory Visit: Payer: Self-pay | Admitting: Physician Assistant

## 2019-07-04 DIAGNOSIS — Z880 Allergy status to penicillin: Secondary | ICD-10-CM | POA: Insufficient documentation

## 2019-07-04 DIAGNOSIS — Z881 Allergy status to other antibiotic agents status: Secondary | ICD-10-CM | POA: Diagnosis not present

## 2019-07-04 DIAGNOSIS — L0501 Pilonidal cyst with abscess: Secondary | ICD-10-CM | POA: Diagnosis present

## 2019-07-04 DIAGNOSIS — I10 Essential (primary) hypertension: Secondary | ICD-10-CM | POA: Insufficient documentation

## 2019-07-04 DIAGNOSIS — Z5189 Encounter for other specified aftercare: Secondary | ICD-10-CM

## 2019-07-04 DIAGNOSIS — Z09 Encounter for follow-up examination after completed treatment for conditions other than malignant neoplasm: Secondary | ICD-10-CM | POA: Insufficient documentation

## 2019-07-05 NOTE — Progress Notes (Signed)
[name], [name] ([id number]) Visit Report for 07/04/2019 Chief Complaint Document Details Patient Name: [name], [name]. Date of Service: 07/04/2019 10:15 AM Medical Record Number: [id number] Patient Account Number: [id number] Date of Birth/Sex: 02/05/1991 (28 y.o. Female) Treating RN: Montey Hora Primary Care Provider: Eliezer Lofts Other Clinician: Referring Provider: Eliezer Lofts Treating Provider/Extender: Melburn Hake, HOYT Weeks in Treatment: 1 Information Obtained from: Patient Chief Complaint Possible pilonidal cyst in the right gluteal/sacral region Electronic Signature(s) Signed: 07/05/2019 1:30:00 AM By: Worthy Keeler PA-C Entered By: Worthy Keeler on 07/04/2019 10:31:39 [place], [name] ([id number]) -------------------------------------------------------------------------------- HPI Details Patient Name: [name] Date of Service: 07/04/2019 10:15 AM Medical Record Number: [id number] Patient Account Number: [id number] Date of Birth/Sex: 1991/03/18 (28 y.o. Female) Treating RN: Montey Hora Primary Care Provider: Eliezer Lofts Other Clinician: Referring Provider: Eliezer Lofts Treating Provider/Extender: Melburn Hake, HOYT Weeks in Treatment: 1 History of Present Illness HPI Description: 05/18/19 on evaluation today patient actually appears to be doing quite well even though this is the initial inspection here in our office. In the right gluteal region she actually has a area where previously in just the past several days she had an open wound that was draining. This actually appears to be an access that has since closed over and seems to be healing quite nicely. 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 There's no signs of infection, erythema, No fevers, chills, nausea, or vomiting noted at this time. She tells me she's had  this happened several times and is normally for several days it will drain the go away generally the same spot is what she has current. She was referred to a surgeon earlier in the year but she tells me that it was cost prohibitive for her that she see the surgeon. 06/27/19 patient presents today for reevaluation not previously seen her in the office due to issues she had what was thought to be a pilonidal cyst. Subsequently not have she saw musician actually pretty much improved not to solve with the counselor this was a little over a month ago. Subsequently she presents back again today with an apparent issue in the exact same location. She is concerned about was needed to be drained.oothat is what had to be dine before. No fevers, chills, nausea, or vomiting noted at this time. 07/04/19 evaluation today patient actually appears to be doing better in regard to her abscess. She still has a lot of purulent drainage although she states has been draining much less than it was in the past. There is still small opening from where open this and drained it last week. With that being said she did culture and unusual organism for which susceptibility testing is not necessarily common. Nonetheless I did look in the same forgot it appears that Bactrim will not really work for this we are gonna need to switch her to Augmentin which will better treat the situation that we're seeing. Electronic Signature(s) Signed: 07/05/2019 1:30:00 AM By: Worthy Keeler PA-C Entered By: Worthy Keeler on 07/04/2019 10:43:04 [name], [name] ([id number]) -------------------------------------------------------------------------------- Physical Exam Details Patient Name: [name] Date of Service: 07/04/2019 10:15 AM Medical Record Number: [id number] Patient Account Number: [id number] Date of Birth/Sex: Jul 24, 1991 (28 y.o. Female) Treating RN: Montey Hora Primary Care Provider: Eliezer Lofts Other  Clinician: Referring Provider: Eliezer Lofts Treating Provider/Extender:  STONE III, HOYT Weeks in Treatment: 1 Constitutional Well-nourished and well-hydrated in no acute distress. Respiratory normal breathing without difficulty. clear to auscultation bilaterally. Cardiovascular regular rate and rhythm with normal S1, S2. Psychiatric this patient is able to make decisions and demonstrates good insight into disease process. Alert and Oriented x 3. pleasant and cooperative. Notes At this point my suggestion based on what I'm seeing is gonna be that no further debridement was necessary. I do think she's gonna still need to have this area packed on a regular basis fortunately her stepmother is able to do this. She seems to be doing a great job. Overall with the appropriate about some of this will get better. Electronic Signature(s) Signed: 07/05/2019 1:30:00 AM By: Worthy Keeler PA-C Entered By: Worthy Keeler on 07/04/2019 10:43:33 [name], [name] ([id number]) -------------------------------------------------------------------------------- Physician Orders Details Patient Name: [name] Date of Service: 07/04/2019 10:15 AM Medical Record Number: [id number] Patient Account Number: [id number] Date of Birth/Sex: 1991-03-13 (28 y.o. Female) Treating RN: Montey Hora Primary Care Provider: Eliezer Lofts Other Clinician: Referring Provider: Eliezer Lofts Treating Provider/Extender: Melburn Hake, HOYT Weeks in Treatment: 1 Verbal / Phone Orders: No Diagnosis Coding ICD-10 Coding Code Description L05.01 Pilonidal cyst with abscess Wound Cleansing Wound #1 Right Sacrum o Dial antibacterial soap, wash wounds, rinse and pat dry prior to dressing wounds Primary Wound Dressing Wound #1 Right Sacrum o Iodoform packing Gauze Secondary Dressing Wound #1 Right Sacrum o Dry Gauze o [place] Dressing Change Frequency Wound #1 Right Sacrum o Change dressing every other  day. Follow-up Appointments Wound #1 Right Sacrum o Return Appointment in 1 week. Radiology o X-ray, other - sacrum Patient Medications Allergies: No Known Drug Allergies Notifications Medication Indication Start End Augmentin 07/04/2019 DOSE 1 - oral 875 mg-125 mg tablet - 1 tablet oral taken 2 times a day for 14 days Electronic Signature(s) Signed: 07/04/2019 10:44:38 AM By: Worthy Keeler PA-C Entered By: Worthy Keeler on 07/04/2019 10:44:37 [place], [name] ([id number]) [place], [name] ([id number]) -------------------------------------------------------------------------------- Problem List Details Patient Name: [name] Date of Service: 07/04/2019 10:15 AM Medical Record Number: [id number] Patient Account Number: [id number] Date of Birth/Sex: 1991-11-23 (28 y.o. Female) Treating RN: Montey Hora Primary Care Provider: Eliezer Lofts Other Clinician: Referring Provider: Eliezer Lofts Treating Provider/Extender: Melburn Hake, HOYT Weeks in Treatment: 1 Active Problems ICD-10 Evaluated Encounter Code Description Active Date Today Diagnosis L05.01 Pilonidal cyst with abscess 06/27/2019 No Yes Inactive Problems Resolved Problems Electronic Signature(s) Signed: 07/05/2019 1:30:00 AM By: Worthy Keeler PA-C Entered By: Worthy Keeler on 07/04/2019 10:31:34 [place], [name] ([id number]) -------------------------------------------------------------------------------- Progress Note Details Patient Name: [name] Date of Service: 07/04/2019 10:15 AM Medical Record Number: [id number] Patient Account Number: [id number] Date of Birth/Sex: 08-17-1991 (28 y.o. Female) Treating RN: Montey Hora Primary Care Provider: Eliezer Lofts Other Clinician: Referring Provider: Eliezer Lofts Treating Provider/Extender: Melburn Hake, HOYT Weeks in Treatment: 1 Subjective Chief Complaint Information obtained from Patient Possible pilonidal cyst in the right gluteal/sacral  region History of Present Illness (HPI) 05/18/19 on evaluation today patient actually appears to be doing quite well even though this is the initial inspection here in our office. In the right gluteal region she actually has a area where previously in just the past several days she had an open wound that was draining. This actually appears to be an access that has since closed over and seems to be healing quite nicely. 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 There's no signs of infection, erythema, No fevers, chills, nausea, or vomiting noted at this time. She tells me she's had this happened several times and is normally for several days it will drain the go away generally the same spot is what she has current. She was referred to a surgeon earlier in the year but she tells me that it was cost prohibitive for her that she see the surgeon. 06/27/19 patient presents today for reevaluation not previously seen her in the office due to issues she had what was thought to be a pilonidal cyst. Subsequently not have she saw musician actually pretty much improved not to solve with the counselor this was a little over a month ago. Subsequently she presents back again today with an apparent issue in the exact same location. She is concerned about was needed to be drained. that is what had to be dine before. No fevers, chills, nausea, or vomiting noted at this time. 07/04/19 evaluation today patient actually appears to be doing better in regard to her abscess. She still has a lot of purulent drainage although she states has been draining much less than it was in the past. There is still small opening from where open this and drained it last week. With that being said she did culture and unusual organism for which susceptibility testing is not necessarily common.  Nonetheless I did look in the same forgot it appears that Bactrim will not really work for this we are gonna need to switch her to Augmentin which will better treat the situation that we're seeing. Patient History Information obtained from Patient. Social History Never smoker, Alcohol Use - Never, Drug Use - No History, Caffeine Use - Daily. Medical History Eyes Denies history of Cataracts, Glaucoma Ear/Nose/Mouth/Throat Denies history of Chronic sinus problems/congestion, Middle ear problems Hematologic/Lymphatic Denies history of Anemia, Hemophilia, Human Immunodeficiency Virus, [name], Sickle Cell Disease Cardiovascular Denies history of Angina, Arrhythmia, Congestive Heart Failure, Coronary Artery Disease, Deep Vein Thrombosis, Hypertension, Hypotension, Myocardial Infarction, Peripheral Arterial Disease, Peripheral Venous Disease, Phlebitis, Vasculitis Gastrointestinal Denies history of Cirrhosis , Colitis, Crohn s, Hepatitis A, Hepatitis B, Hepatitis C Sibilia, Akeiba Delane Ginger ([id number]) Endocrine Denies history of Type I Diabetes, Type II Diabetes Genitourinary Denies history of End Stage Renal Disease Immunological Denies history of Lupus Erythematosus, Raynaud s, Scleroderma Integumentary (Skin) Denies history of History of Burn, History of pressure wounds Musculoskeletal Denies history of Gout, Rheumatoid Arthritis, Osteoarthritis, Osteomyelitis Neurologic Denies history of Dementia, Neuropathy, Quadriplegia, Paraplegia, Seizure Disorder Oncologic Denies history of Received Chemotherapy, Received Radiation Psychiatric Denies history of Anorexia/bulimia, Confinement Anxiety Review of Systems (ROS) Constitutional Symptoms (General Health) Denies complaints or symptoms of Fatigue, Fever, Chills, Marked Weight Change. Respiratory Denies complaints or symptoms of Chronic or frequent coughs, Shortness of Breath. Cardiovascular Denies complaints or symptoms of Chest pain,  LE edema. Psychiatric Denies complaints or symptoms of Anxiety, Claustrophobia. Objective Constitutional Well-nourished and well-hydrated in no acute distress. Vitals Time Taken: 10:16 AM, Height: 67 in, Weight: 300 lbs, BMI: 47, Temperature: 98.4 F, Pulse: 89 bpm, Respiratory Rate: 16 breaths/min, Blood Pressure: 149/92 mmHg. Respiratory normal breathing without difficulty. clear to auscultation bilaterally. Cardiovascular regular rate and rhythm with normal S1, S2. Psychiatric this patient is able to make decisions and demonstrates good insight into disease process. Alert and Oriented x 3. pleasant and cooperative. General Notes: At this  point my suggestion based on what I'm seeing is gonna be that no further debridement was necessary. I do think she's gonna still need to have this area packed on a regular basis fortunately her stepmother is able to do this. She seems to be doing a great job. Overall with the appropriate about some of this will get better. [name], [name] ([id number]) Integumentary (Hair, Skin) Wound #1 status is Open. Original cause of wound was Pimple. The wound is located on the Right Sacrum. The wound measures 2cm length x 0.2cm width x 3.3cm depth; 0.314cm^2 area and 1.037cm^3 volume. There is Fat Layer (Subcutaneous Tissue) Exposed exposed. There is no tunneling or undermining noted. There is a none present amount of drainage noted. The wound margin is thickened. There is medium (34-66%) pink granulation within the wound bed. There is no necrotic tissue within the wound bed. Assessment Active Problems ICD-10 Pilonidal cyst with abscess Plan Wound Cleansing: Wound #1 Right Sacrum: Dial antibacterial soap, wash wounds, rinse and pat dry prior to dressing wounds Primary Wound Dressing: Wound #1 Right Sacrum: Iodoform packing Gauze Secondary Dressing: Wound #1 Right Sacrum: Dry Gauze [place] Dressing Change Frequency: Wound #1 Right  Sacrum: Change dressing every other day. Follow-up Appointments: Wound #1 Right Sacrum: Return Appointment in 1 week. Radiology ordered were: X-ray, other - sacrum The following medication(s) was prescribed: Augmentin oral 875 mg-125 mg tablet 1 1 tablet oral taken 2 times a day for 14 days starting 07/04/2019 My suggestion at this time is gonna be that we continue with the above wound care measures for the next week and the patient is in agreement with that plan. I am gonna go and sent in a prescription for Augmentin which will hopefully better treat the organism that was present on the culture. We will subsequently see were things stand in one weeks time. If anything changes worsens meantime the patient will contact the office and let me know. I'm also gonna send her for an x-ray of the sacrum I'm not 100% sure that this extends down to the sacrum but nonetheless this is a very deep ulcer and I do want to at least evaluate for the possibility of osteomyelitis to be sure there's no obvious signs of an x-ray of this. If anything changes or worsens meantime patient will contact the office and let me know. [name], [name] ([id number]) Please see above for specific wound care orders. We will see patient for re-evaluation in 1 week(s) here in the clinic. If anything worsens or changes patient will contact our office for additional recommendations. Electronic Signature(s) Signed: 07/05/2019 1:30:00 AM By: Worthy Keeler PA-C Entered By: Worthy Keeler on 07/04/2019 10:45:26 Needs, [name] ([id number]) -------------------------------------------------------------------------------- ROS/PFSH Details Patient Name: [name] Date of Service: 07/04/2019 10:15 AM Medical Record Number: [id number] Patient Account Number: [id number] Date of Birth/Sex: 10-17-1991 (28 y.o. Female) Treating RN: Montey Hora Primary Care Provider: Eliezer Lofts Other Clinician: Referring Provider: Eliezer Lofts Treating Provider/Extender: Melburn Hake, HOYT Weeks in Treatment: 1 Information Obtained From Patient Constitutional Symptoms (General Health) Complaints and Symptoms: Negative for: Fatigue; Fever; Chills; Marked Weight Change Respiratory Complaints and Symptoms: Negative for: Chronic or frequent coughs; Shortness of Breath Cardiovascular Complaints and Symptoms: Negative for: Chest pain; LE edema Medical History: Negative for: Angina; Arrhythmia; Congestive Heart Failure; Coronary Artery Disease; Deep Vein Thrombosis; Hypertension; Hypotension; Myocardial Infarction; Peripheral Arterial Disease; Peripheral Venous Disease; Phlebitis; Vasculitis Psychiatric Complaints and Symptoms: Negative for: Anxiety; Claustrophobia Medical History: Negative for:  Anorexia/bulimia; Confinement Anxiety Eyes Medical History: Negative for: Cataracts; Glaucoma Ear/Nose/Mouth/Throat Medical History: Negative for: Chronic sinus problems/congestion; Middle ear problems Hematologic/Lymphatic Medical History: Negative for: Anemia; Hemophilia; Human Immunodeficiency Virus; [name]; Sickle Cell Disease Gastrointestinal Medical History: Negative for: Cirrhosis ; Colitis; Crohnos; Hepatitis A; Hepatitis B; Hepatitis C Koloski, Sophia N. ([id number]) Endocrine Medical History: Negative for: Type I Diabetes; Type II Diabetes Genitourinary Medical History: Negative for: End Stage Renal Disease Immunological Medical History: Negative for: Lupus Erythematosus; Raynaudos; Scleroderma Integumentary (Skin) Medical History: Negative for: History of Burn; History of pressure wounds Musculoskeletal Medical History: Negative for: Gout; Rheumatoid Arthritis; Osteoarthritis; Osteomyelitis Neurologic Medical History: Negative for: Dementia; Neuropathy; Quadriplegia; Paraplegia; Seizure Disorder Oncologic Medical History: Negative for: Received Chemotherapy; Received  Radiation Immunizations Pneumococcal Vaccine: Received Pneumococcal Vaccination: No Implantable Devices None Family and Social History Never smoker; Alcohol Use: Never; Drug Use: No History; Caffeine Use: Daily; Financial Concerns: No; Food, Clothing or Shelter Needs: No; Support System Lacking: No; Transportation Concerns: No Physician Affirmation I have reviewed and agree with the above information. Electronic Signature(s) Signed: 07/04/2019 5:36:25 PM By: Montey Hora Signed: 07/05/2019 1:30:00 AM By: Worthy Keeler PA-C Entered By: Worthy Keeler on 07/04/2019 10:43:21 [place], [name] ([id number]) -------------------------------------------------------------------------------- SuperBill Details Patient Name: [name] Date of Service: 07/04/2019 Medical Record Number: [id number] Patient Account Number: [id number] Date of Birth/Sex: June 30, 1991 (28 y.o. Female) Treating RN: Montey Hora Primary Care Provider: Eliezer Lofts Other Clinician: Referring Provider: Eliezer Lofts Treating Provider/Extender: Melburn Hake, HOYT Weeks in Treatment: 1 Diagnosis Coding ICD-10 Codes Code Description L05.01 Pilonidal cyst with abscess Facility Procedures CPT4 Code: [id number] Description: [facility] VISIT-LEV 3 EST PT Modifier: Quantity: 1 Physician Procedures CPT4 Code: [id number] Description: [id number] - WC PHYS LEVEL 4 - EST PT ICD-10 Diagnosis Description L05.01 Pilonidal cyst with abscess Modifier: Quantity: 1 Electronic Signature(s) Signed: 07/05/2019 1:30:00 AM By: Worthy Keeler PA-C Entered By: Worthy Keeler on 07/04/2019 10:45:35

## 2019-07-05 NOTE — Progress Notes (Signed)
TARALYN, FERRAIOLO ([id number]) Visit Report for 07/04/2019 Arrival Information Details Patient Name: [name], [name]. Date of Service: 07/04/2019 10:15 AM Medical Record Number: [id number] Patient Account Number: [id number] Date of Birth/Sex: May 07, 1991 (28 y.o. Female) Treating RN: Army Melia Primary Care Carys Malina: Eliezer Lofts Other Clinician: Referring Kayli Beal: Eliezer Lofts Treating Katelynd Blauvelt/Extender: Melburn Hake, HOYT Weeks in Treatment: 1 Visit Information History Since Last Visit Added or deleted any medications: No Patient Arrived: Ambulatory Any new allergies or adverse reactions: No Arrival Time: 10:15 Had a fall or experienced change in No Accompanied By: self activities of daily living that may affect Transfer Assistance: None risk of falls: Signs or symptoms of abuse/neglect since last visito No Hospitalized since last visit: No Has Dressing in Place as Prescribed: Yes Pain Present Now: No Electronic Signature(s) Signed: 07/04/2019 10:47:17 AM By: Army Melia Entered By: Army Melia on 07/04/2019 10:15:51 Kalish, [name] ([id number]) -------------------------------------------------------------------------------- Clinic Level of Care Assessment Details Patient Name: [name] Date of Service: 07/04/2019 10:15 AM Medical Record Number: [id number] Patient Account Number: [id number] Date of Birth/Sex: Jun 16, 1991 (28 y.o. Female) Treating RN: Montey Hora Primary Care Lakeya Mulka: Eliezer Lofts Other Clinician: Referring Montey Ebel: Eliezer Lofts Treating Durand Wittmeyer/Extender: Melburn Hake, HOYT Weeks in Treatment: 1 Clinic Level of Care Assessment Items TOOL 4 Quantity Score []  - Use when only an EandM is performed on FOLLOW-UP visit 0 ASSESSMENTS - Nursing Assessment / Reassessment X - Reassessment of Co-morbidities (includes updates in patient status) 1 10 X- 1 5 Reassessment of Adherence to Treatment Plan ASSESSMENTS - Wound and Skin Assessment / Reassessment X -  Simple Wound Assessment / Reassessment - one wound 1 5 []  - 0 Complex Wound Assessment / Reassessment - multiple wounds []  - 0 Dermatologic / Skin Assessment (not related to wound area) ASSESSMENTS - Focused Assessment []  - Circumferential Edema Measurements - multi extremities 0 []  - 0 Nutritional Assessment / Counseling / Intervention []  - 0 Lower Extremity Assessment (monofilament, tuning fork, pulses) []  - 0 Peripheral Arterial Disease Assessment (using hand held doppler) ASSESSMENTS - Ostomy and/or Continence Assessment and Care []  - Incontinence Assessment and Management 0 []  - 0 Ostomy Care Assessment and Management (repouching, etc.) PROCESS - Coordination of Care X - Simple Patient / Family Education for ongoing care 1 15 []  - 0 Complex (extensive) Patient / Family Education for ongoing care X- 1 10 Staff obtains Programmer, systems, Records, Test Results / Process Orders []  - 0 Staff telephones HHA, Nursing Homes / Clarify orders / etc []  - 0 Routine Transfer to another Facility (non-emergent condition) []  - 0 Routine Hospital Admission (non-emergent condition) []  - 0 New Admissions / Biomedical engineer / Ordering NPWT, Apligraf, etc. []  - 0 Emergency Hospital Admission (emergent condition) X- 1 10 Simple Discharge Coordination [name], [name] ([id number]) []  - 0 Complex (extensive) Discharge Coordination PROCESS - Special Needs []  - Pediatric / Minor Patient Management 0 []  - 0 Isolation Patient Management []  - 0 Hearing / Language / Visual special needs []  - 0 Assessment of Community assistance (transportation, D/C planning, etc.) []  - 0 Additional assistance / Altered mentation []  - 0 Support Surface(s) Assessment (bed, cushion, seat, etc.) INTERVENTIONS - Wound Cleansing / Measurement X - Simple Wound Cleansing - one wound 1 5 []  - 0 Complex Wound Cleansing - multiple wounds X- 1 5 Wound Imaging (photographs - any number of wounds) []  - 0 Wound  Tracing (instead of photographs) X- 1 5 Simple Wound Measurement - one wound []  - 0 Complex Wound  Measurement - multiple wounds INTERVENTIONS - Wound Dressings X - Small Wound Dressing one or multiple wounds 1 10 []  - 0 Medium Wound Dressing one or multiple wounds []  - 0 Large Wound Dressing one or multiple wounds []  - 0 Application of Medications - topical []  - 0 Application of Medications - injection INTERVENTIONS - Miscellaneous []  - External ear exam 0 []  - 0 Specimen Collection (cultures, biopsies, blood, body fluids, etc.) []  - 0 Specimen(s) / Culture(s) sent or taken to Lab for analysis []  - 0 Patient Transfer (multiple staff / Civil Service fast streamer / Similar devices) []  - 0 Simple Staple / Suture removal (25 or less) []  - 0 Complex Staple / Suture removal (26 or more) []  - 0 Hypo / Hyperglycemic Management (close monitor of Blood Glucose) []  - 0 Ankle / Brachial Index (ABI) - do not check if billed separately X- 1 5 Vital Signs [name], Sophia N. ([id number]) Has the patient been seen at the hospital within the last three years: Yes Total Score: 85 Level Of Care: New/Established - Level 3 Electronic Signature(s) Signed: 07/04/2019 5:36:25 PM By: Montey Hora Entered By: Montey Hora on 07/04/2019 10:39:41 Rensch, [name] ([id number]) -------------------------------------------------------------------------------- Encounter Discharge Information Details Patient Name: [name] Date of Service: 07/04/2019 10:15 AM Medical Record Number: [id number] Patient Account Number: [id number] Date of Birth/Sex: 1991/06/30 (28 y.o. Female) Treating RN: Montey Hora Primary Care Addiel Mccardle: Eliezer Lofts Other Clinician: Referring Wai Litt: Eliezer Lofts Treating Mercede Rollo/Extender: Melburn Hake, HOYT Weeks in Treatment: 1 Encounter Discharge Information Items Discharge Condition: Stable Ambulatory Status: Ambulatory Discharge Destination: Home Transportation: Private  Auto Accompanied By: self Schedule Follow-up Appointment: Yes Clinical Summary of Care: Electronic Signature(s) Signed: 07/04/2019 5:36:25 PM By: Montey Hora Entered By: Montey Hora on 07/04/2019 10:40:42 Kham, [name] ([id number]) -------------------------------------------------------------------------------- Lower Extremity Assessment Details Patient Name: [name] Date of Service: 07/04/2019 10:15 AM Medical Record Number: [id number] Patient Account Number: [id number] Date of Birth/Sex: 02/08/1991 (28 y.o. Female) Treating RN: Army Melia Primary Care Brendia Dampier: Eliezer Lofts Other Clinician: Referring Kate Sweetman: Eliezer Lofts Treating Khalel Alms/Extender: Melburn Hake, HOYT Weeks in Treatment: 1 Electronic Signature(s) Signed: 07/04/2019 10:47:17 AM By: Army Melia Entered By: Army Melia on 07/04/2019 10:24:19 Slape, [name] ([id number]) -------------------------------------------------------------------------------- Multi Wound Chart Details Patient Name: [name] Date of Service: 07/04/2019 10:15 AM Medical Record Number: [id number] Patient Account Number: [id number] Date of Birth/Sex: 07/18/1991 (28 y.o. Female) Treating RN: Montey Hora Primary Care Etoy Mcdonnell: Eliezer Lofts Other Clinician: Referring Galia Rahm: Eliezer Lofts Treating Fenris Cauble/Extender: Melburn Hake, HOYT Weeks in Treatment: 1 Vital Signs Height(in): 67 Pulse(bpm): 89 Weight(lbs): 300 Blood Pressure(mmHg): 149/92 Body Mass Index(BMI): 47 Temperature(F): 98.4 Respiratory Rate 16 (breaths/min): Photos: [N/A:N/A] Wound Location: Right Sacrum N/A N/A Wounding Event: Pimple N/A N/A Primary Etiology: Pilonidal Cyst N/A N/A Date Acquired: 06/18/2019 N/A N/A Weeks of Treatment: 1 N/A N/A Wound Status: Open N/A N/A Measurements L x W x D 2x0.2x3.3 N/A N/A (cm) Area (cm) : 0.314 N/A N/A Volume (cm) : 1.037 N/A N/A % Reduction in Area: -3825.00% N/A N/A % Reduction in Volume:  -103600.00% N/A N/A Classification: Partial Thickness N/A N/A Exudate Amount: None Present N/A N/A Wound Margin: Thickened N/A N/A Granulation Amount: Medium (34-66%) N/A N/A Granulation Quality: Pink N/A N/A Necrotic Amount: None Present (0%) N/A N/A Exposed Structures: Fat Layer (Subcutaneous N/A N/A Tissue) Exposed: Yes Fascia: No Tendon: No Muscle: No Joint: No Bone: No Epithelialization: None N/A N/A Treatment Notes [name], [name] ([id number]) Electronic Signature(s) Signed: 07/04/2019 5:36:25 PM By:  Dorthy, Di Kindle Entered By: Montey Hora on 07/04/2019 10:37:00 [name] ([id number]) -------------------------------------------------------------------------------- [place] Details Patient Name: [name], [name]. Date of Service: 07/04/2019 10:15 AM Medical Record Number: [id number] Patient Account Number: [id number] Date of Birth/Sex: 1991/07/26 (28 y.o. Female) Treating RN: Montey Hora Primary Care [place] Natarajan: Eliezer Lofts Other Clinician: Referring Dmario Russom: Eliezer Lofts Treating Kailea Dannemiller/Extender: Melburn Hake, HOYT Weeks in Treatment: 1 Active Inactive Orientation to the Wound Care Program Nursing Diagnoses: Knowledge deficit related to the wound healing center program Goals: Patient/caregiver will verbalize understanding of the [facility] Date Initiated: 06/27/2019 Target Resolution Date: 09/02/2019 Goal Status: Active Interventions: Provide education on orientation to the wound center Notes: Pain, Acute or Chronic Nursing Diagnoses: Pain, acute or chronic: actual or potential Goals: Patient/caregiver will verbalize comfort level met Date Initiated: 06/27/2019 Target Resolution Date: 09/02/2019 Goal Status: Active Interventions: Complete pain assessment as per visit requirements Notes: Soft Tissue Infection Nursing Diagnoses: Potential for infection: soft tissue Goals: Patient will remain free of wound  infection Date Initiated: 06/27/2019 Target Resolution Date: 09/02/2019 Goal Status: Active Interventions: Assess signs and symptoms of infection every visit [name], [name] ([id number]) Notes: Wound/Skin Impairment Nursing Diagnoses: Impaired tissue integrity Goals: Ulcer/skin breakdown will heal within 14 weeks Date Initiated: 06/27/2019 Target Resolution Date: 09/02/2019 Goal Status: Active Interventions: Assess patient/caregiver ability to obtain necessary supplies Assess patient/caregiver ability to perform ulcer/skin care regimen upon admission and as needed Assess ulceration(s) every visit Notes: Electronic Signature(s) Signed: 07/04/2019 5:36:25 PM By: Montey Hora Entered By: Montey Hora on 07/04/2019 10:36:51 Piscopo, [name] ([id number]) -------------------------------------------------------------------------------- Pain Assessment Details Patient Name: [name] Date of Service: 07/04/2019 10:15 AM Medical Record Number: [id number] Patient Account Number: [id number] Date of Birth/Sex: [birth year] (28 y.o. Female) Treating RN: Army Melia Primary Care Alexias Margerum: Eliezer Lofts Other Clinician: Referring Taleisha Kaczynski: Eliezer Lofts Treating Tywon Niday/Extender: Melburn Hake, HOYT Weeks in Treatment: 1 Active Problems Location of Pain Severity and Description of Pain Patient Has Paino No Site Locations Pain Management and Medication Current Pain Management: Electronic Signature(s) Signed: 07/04/2019 10:47:17 AM By: Army Melia Entered By: Army Melia on 07/04/2019 10:15:57 [name], [name] ([id number]) -------------------------------------------------------------------------------- Patient/Caregiver Education Details Patient Name: [name] Date of Service: 07/04/2019 10:15 AM Medical Record Number: [id number] Patient Account Number: [id number] Date of Birth/Gender: [birth year] (28 y.o. Female) Treating RN: Montey Hora Primary Care Physician: Eliezer Lofts Other Clinician: Referring Physician: Eliezer Lofts Treating Physician/Extender: Sharalyn Ink in Treatment: 1 Education Assessment Education Provided To: Patient Education Topics Provided Wound/Skin Impairment: Handouts: Other: wound care to continue as ordered Methods: Demonstration, Explain/Verbal Responses: State content correctly Electronic Signature(s) Signed: 07/04/2019 5:36:25 PM By: Montey Hora Entered By: Montey Hora on 07/04/2019 10:40:07 [name], [name] ([id number]) -------------------------------------------------------------------------------- Wound Assessment Details Patient Name: [name] Date of Service: 07/04/2019 10:15 AM Medical Record Number: [id number] Patient Account Number: [id number] Date of Birth/Sex: Aug 17, 1991 (28 y.o. Female) Treating RN: Army Melia Primary Care Ishmail Mcmanamon: Eliezer Lofts Other Clinician: Referring Maysa Lynn: Eliezer Lofts Treating Malaia Buchta/Extender: Melburn Hake, HOYT Weeks in Treatment: 1 Wound Status Wound Number: 1 Primary Etiology: Pilonidal Cyst Wound Location: Right Sacrum Wound Status: Open Wounding Event: Pimple Date Acquired: 06/18/2019 Weeks Of Treatment: 1 Clustered Wound: No Photos Wound Measurements Length: (cm) 2 Width: (cm) 0.2 Depth: (cm) 3.3 Area: (cm) 0.314 Volume: (cm) 1.037 % Reduction in Area: -3825% % Reduction in Volume: -103600% Epithelialization: None Tunneling: No Undermining: No Wound Description Classification: Partial Thickness Foul Odor Af Wound Margin: Thickened Slough/Fibri Exudate Amount:  None Present ter Cleansing: No no Yes Wound Bed Granulation Amount: Medium (34-66%) Exposed Structure Granulation Quality: Pink Fascia Exposed: No Necrotic Amount: None Present (0%) Fat Layer (Subcutaneous Tissue) Exposed: Yes Tendon Exposed: No Muscle Exposed: No Joint Exposed: No Bone Exposed: No Treatment Notes Wound #1 (Right Sacrum) Notes [name], Sophia N.  ([id number]) iodoform packing, gauze and [employer] systems engineer) Signed: 07/04/2019 10:47:17 AM By: Army Melia Entered By: Army Melia on 07/04/2019 10:24:01 [name], [name] ([id number]) -------------------------------------------------------------------------------- Vitals Details Patient Name: [name] Date of Service: 07/04/2019 10:15 AM Medical Record Number: [id number] Patient Account Number: [id number] Date of Birth/Sex: [birth year] (28 y.o. Female) Treating RN: Army Melia Primary Care Savaya Hakes: Eliezer Lofts Other Clinician: Referring Racquel Arkin: Eliezer Lofts Treating Yasmina [place]/Extender: Melburn Hake, HOYT Weeks in Treatment: 1 Vital Signs Time Taken: 10:16 Temperature (F): 98.4 Height (in): 67 Pulse (bpm): 89 Weight (lbs): 300 Respiratory Rate (breaths/min): 16 Body Mass Index (BMI): 47 Blood Pressure (mmHg): 149/92 Reference Range: 80 - 120 mg / dl Electronic Signature(s) Signed: 07/04/2019 10:47:17 AM By: Army Melia Entered By: Army Melia on 07/04/2019 10:16:23

## 2019-07-11 ENCOUNTER — Other Ambulatory Visit: Payer: Self-pay

## 2019-07-11 ENCOUNTER — Encounter: Payer: 59 | Admitting: Internal Medicine

## 2019-07-11 DIAGNOSIS — Z09 Encounter for follow-up examination after completed treatment for conditions other than malignant neoplasm: Secondary | ICD-10-CM | POA: Diagnosis not present

## 2019-07-12 NOTE — Progress Notes (Signed)
[name], [name] ([id number]) Visit Report for 07/11/2019 HPI Details Patient Name: [name], [name]. Date of Service: 07/11/2019 9:30 AM Medical Record Number: [id number] Patient Account Number: [id number] Date of Birth/Sex: Feb 24, 1991 (28 y.o. F) Treating RN: Montey Hora Primary Care Provider: Eliezer Lofts Other Clinician: Referring Provider: Eliezer Lofts Treating Provider/Extender: Beverly Gust in Treatment: 2 History of Present Illness HPI Description: 05/18/19 on evaluation today patient actually appears to be doing quite well even though this is the initial inspection here in our office. In the right gluteal region she actually has a area where previously in just the past several days she had an open wound that was draining. This actually appears to be an access that has since closed over and seems to be healing quite nicely. 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 There's no signs of infection, erythema, No fevers, chills, nausea, or vomiting noted at this time. She tells me she's had this happened several times and is normally for several days it will drain the go away generally the same spot is what she has current. She was referred to a surgeon earlier in the year but she tells me that it was cost prohibitive for her that she see the surgeon. 06/27/19 patient presents today for reevaluation not previously seen her in the office due to issues she had what was thought to be a pilonidal cyst. Subsequently not have she saw musician actually pretty much improved not to solve with the counselor this was a little over a month ago. Subsequently she presents back again today with an apparent issue in the exact same location. She is concerned about was needed to be drained.oothat is what had to be dine before. No fevers, chills, nausea,  or vomiting noted at this time. 07/04/19 evaluation today patient actually appears to be doing better in regard to her abscess. She still has a lot of purulent drainage although she states has been draining much less than it was in the past. There is still small opening from where open this and drained it last week. With that being said she did culture and unusual organism for which susceptibility testing is not necessarily common. Nonetheless I did look in the same forgot it appears that Bactrim will not really work for this we are gonna need to switch her to Augmentin which will better treat the situation that we're seeing. 07/11/19-Patient actually feels a whole lot better with the area of the pilonidal cyst being closed up, much more comfortable, she has been sitting less, she is on Augmentin that she is completing. Electronic Signature(s) Signed: 07/11/2019 10:00:01 AM By: Tobi Bastos Entered By: Tobi Bastos on 07/11/2019 10:00:00 [name] ([id number]) -------------------------------------------------------------------------------- Physical Exam Details Patient Name: [name] Date of Service: 07/11/2019 9:30 AM Medical Record Number: [id number] Patient Account Number: [id number] Date of Birth/Sex: 01-Aug-1991 (28 y.o. F) Treating RN: Montey Hora Primary Care Provider: Eliezer Lofts Other Clinician: Referring Provider: Eliezer Lofts Treating Provider/Extender: Beverly Gust in Treatment: 2 Constitutional alert and oriented x 3. sitting or standing blood pressure is within target range for patient.. supine blood pressure is within target range for patient.. pulse regular and within target range for patient.Marland Kitchen respirations regular, non-labored and within target range for patient.Marland Kitchen temperature within target range for patient.. . . Well-nourished and well-hydrated in no acute distress.  Notes Area of the pilonidal cyst opening has been covered and  epithelialized, however there is a tiny amount of secretion that is typical for a pilonidal cyst debris that is expressed from the sinus. This leads me to believe that patient does have a sinus tract that has been established. There is no induration no evidence of active infection at this time Electronic Signature(s) Signed: 07/11/2019 10:01:06 AM By: Tobi Bastos Entered By: Tobi Bastos on 07/11/2019 10:01:05 Shaw, [name] ([id number]) -------------------------------------------------------------------------------- Physician Orders Details Patient Name: [name] Date of Service: 07/11/2019 9:30 AM Medical Record Number: [id number] Patient Account Number: [id number] Date of Birth/Sex: [birth year] (28 y.o. F) Treating RN: Montey Hora Primary Care Provider: Eliezer Lofts Other Clinician: Referring Provider: Eliezer Lofts Treating Provider/Extender: Beverly Gust in Treatment: 2 Verbal / Phone Orders: No Diagnosis Coding Discharge From [facility] Services o Discharge from [facility] Signature(s) Signed: 07/11/2019 10:21:11 AM By: Tobi Bastos Signed: 07/11/2019 4:43:58 PM By: Montey Hora Entered By: Montey Hora on 07/11/2019 09:57:36 Lanphier, [name] ([id number]) -------------------------------------------------------------------------------- Progress Note Details Patient Name: [name] Date of Service: 07/11/2019 9:30 AM Medical Record Number: [id number] Patient Account Number: [id number] Date of Birth/Sex: [birth year] (28 y.o. F) Treating RN: Montey Hora Primary Care Provider: Eliezer Lofts Other Clinician: Referring Provider: Eliezer Lofts Treating Provider/Extender: Beverly Gust in Treatment: 2 Subjective History of Present Illness (HPI) 05/18/19 on evaluation today patient actually appears to be doing quite well even though this is the initial inspection here in our office. In the right gluteal region she actually  has a area where previously in just the past several days she had an open wound that was draining. This actually appears to be an access that has since closed over and seems to be healing quite nicely. 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 There's no signs of infection, erythema, No fevers, chills, nausea, or vomiting noted at this time. She tells me she's had this happened several times and is normally for several days it will drain the go away generally the same spot is what she has current. She was referred to a surgeon earlier in the year but she tells me that it was cost prohibitive for her that she see the surgeon. 06/27/19 patient presents today for reevaluation not previously seen her in the office due to issues she had what was thought to be a pilonidal cyst. Subsequently not have she saw musician actually pretty much improved not to solve with the counselor this was a little over a month ago. Subsequently she presents back again today with an apparent issue in the exact same location. She is concerned about was needed to be drained. that is what had to be dine before. No fevers, chills, nausea, or vomiting noted at this time. 07/04/19 evaluation today patient actually appears to be doing better in regard to her abscess. She still has a lot of purulent drainage although she states has been draining much less than it was in the past. There is still small opening from where open this and drained it last week. With that being said she did culture and unusual organism for which susceptibility testing is not necessarily common. Nonetheless I did look in the same forgot it appears that Bactrim will not really work for this we are gonna need to switch her  to Augmentin which will better treat the situation that we're seeing. 07/11/19-Patient actually  feels a whole lot better with the area of the pilonidal cyst being closed up, much more comfortable, she has been sitting less, she is on Augmentin that she is completing. Objective Constitutional alert and oriented x 3. sitting or standing blood pressure is within target range for patient.. supine blood pressure is within target range for patient.. pulse regular and within target range for patient.Marland Kitchen respirations regular, non-labored and within target range for patient.Marland Kitchen temperature within target range for patient.. Well-nourished and well-hydrated in no acute distress. Vitals Time Taken: 9:43 AM, Height: 67 in, Weight: 300 lbs, BMI: 47, Temperature: 98.2 F, Pulse: 87 bpm, Respiratory Rate: 16 breaths/min, Blood Pressure: 135/94 mmHg. General Notes: Area of the pilonidal cyst opening has been covered and epithelialized, however there is a tiny amount of secretion that is typical for a pilonidal cyst debris that is expressed from the sinus. This leads me to believe that patient does [name], [name]. ([id number]) have a sinus tract that has been established. There is no induration no evidence of active infection at this time Integumentary (Hair, Skin) Wound #1 status is Healed - Epithelialized. Original cause of wound was Pimple. The wound is located on the Right Sacrum. The wound measures 0cm length x 0cm width x 0cm depth; 0cm^2 area and 0cm^3 volume. There is Fat Layer (Subcutaneous Tissue) Exposed exposed. There is no tunneling or undermining noted. There is a none present amount of drainage noted. The wound margin is thickened. There is medium (34-66%) pink granulation within the wound bed. There is no necrotic tissue within the wound bed. Plan Discharge From [facility] Services: Discharge from [place] 1. At this time the pilonidal cyst is stable without evidence of infection 2. Undoubtedly there is a sinus tract present and drainage is expressed from the opening in the gluteal  cleft 3. Patient should have a solution for avoiding recurrences that will centered around either surgical intervention to remove the pore and sinus tract or at the very least keeping the gluteal cleft area free of hair by laser depilation or other techniques, Electronic Signature(s) Signed: 07/11/2019 10:02:37 AM By: Tobi Bastos Entered By: Tobi Bastos on 07/11/2019 10:02:37 Solimine, [name] ([id number]) -------------------------------------------------------------------------------- SuperBill Details Patient Name: [name] Date of Service: 07/11/2019 Medical Record Number: [id number] Patient Account Number: [id number] Date of Birth/Sex: [birth year] (28 y.o. F) Treating RN: Montey Hora Primary Care Provider: Eliezer Lofts Other Clinician: Referring Provider: Eliezer Lofts Treating Provider/Extender: Beverly Gust in Treatment: 2 Diagnosis Coding ICD-10 Codes Code Description L05.01 Pilonidal cyst with abscess Facility Procedures CPT4 Code: [id number] Description: [place] VISIT-LEV 3 EST PT Modifier: Quantity: 1 Physician Procedures CPT4 Code: [id number] Description: 66294 - WC PHYS LEVEL 3 - EST PT ICD-10 Diagnosis Description L05.01 Pilonidal cyst with abscess Modifier: Quantity: 1 Electronic Signature(s) Signed: 07/11/2019 10:02:53 AM By: Tobi Bastos Entered By: Tobi Bastos on 07/11/2019 10:02:52

## 2019-07-12 NOTE — Progress Notes (Signed)
DARLINDA, BELLOWS ([id number]) Visit Report for 07/11/2019 Arrival Information Details Patient Name: AHJA, MARTELLO. Date of Service: 07/11/2019 9:30 AM Medical Record Number: [id number] Patient Account Number: [id number] Date of Birth/Sex: 1991/02/21 (28 y.o. F) Treating RN: Cornell Barman Primary Care Shamarcus Hoheisel: Eliezer Lofts Other Clinician: Referring Tayanna Talford: Eliezer Lofts Treating Mizuki Hoel/Extender: Beverly Gust in Treatment: 2 Visit Information History Since Last Visit Added or deleted any medications: No Patient Arrived: Ambulatory Any new allergies or adverse reactions: No Arrival Time: 09:41 Had a fall or experienced change in No Accompanied By: self activities of daily living that may affect Transfer Assistance: None risk of falls: Patient Identification Verified: Yes Signs or symptoms of abuse/neglect since last visito No Secondary Verification Process Completed: Yes Hospitalized since last visit: No Implantable device outside of the clinic excluding No cellular tissue based products placed in the center since last visit: Has Dressing in Place as Prescribed: Yes Pain Present Now: No Electronic Signature(s) Signed: 07/11/2019 3:53:21 PM By: Gretta Cool, BSN, RN, CWS, Kim RN, BSN Entered By: Gretta Cool, BSN, RN, CWS, Kim on 07/11/2019 09:42:53 Allshouse, [name] ([id number]) -------------------------------------------------------------------------------- Clinic Level of Care Assessment Details Patient Name: [name] Date of Service: 07/11/2019 9:30 AM Medical Record Number: [id number] Patient Account Number: [id number] Date of Birth/Sex: August 21, 1991 (28 y.o. F) Treating RN: Montey Hora Primary Care Palin Tristan: Eliezer Lofts Other Clinician: Referring Kora Groom: Eliezer Lofts Treating Brydan Downard/Extender: Beverly Gust in Treatment: 2 Clinic Level of Care Assessment Items TOOL 4 Quantity Score []  - Use when only an EandM is performed on FOLLOW-UP visit  0 ASSESSMENTS - Nursing Assessment / Reassessment X - Reassessment of Co-morbidities (includes updates in patient status) 1 10 X- 1 5 Reassessment of Adherence to Treatment Plan ASSESSMENTS - Wound and Skin Assessment / Reassessment X - Simple Wound Assessment / Reassessment - one wound 1 5 []  - 0 Complex Wound Assessment / Reassessment - multiple wounds []  - 0 Dermatologic / Skin Assessment (not related to wound area) ASSESSMENTS - Focused Assessment []  - Circumferential Edema Measurements - multi extremities 0 []  - 0 Nutritional Assessment / Counseling / Intervention []  - 0 Lower Extremity Assessment (monofilament, tuning fork, pulses) []  - 0 Peripheral Arterial Disease Assessment (using hand held doppler) ASSESSMENTS - Ostomy and/or Continence Assessment and Care []  - Incontinence Assessment and Management 0 []  - 0 Ostomy Care Assessment and Management (repouching, etc.) PROCESS - Coordination of Care X - Simple Patient / Family Education for ongoing care 1 15 []  - 0 Complex (extensive) Patient / Family Education for ongoing care X- 1 10 Staff obtains Programmer, systems, Records, Test Results / Process Orders []  - 0 Staff telephones HHA, Nursing Homes / Clarify orders / etc []  - 0 Routine Transfer to another Facility (non-emergent condition) []  - 0 Routine Hospital Admission (non-emergent condition) []  - 0 New Admissions / Biomedical engineer / Ordering NPWT, Apligraf, etc. []  - 0 Emergency Hospital Admission (emergent condition) X- 1 10 Simple Discharge Coordination DORISANN, SCHWANKE ([id number]) []  - 0 Complex (extensive) Discharge Coordination PROCESS - Special Needs []  - Pediatric / Minor Patient Management 0 []  - 0 Isolation Patient Management []  - 0 Hearing / Language / Visual special needs []  - 0 Assessment of Community assistance (transportation, D/C planning, etc.) []  - 0 Additional assistance / Altered mentation []  - 0 Support Surface(s) Assessment (bed,  cushion, seat, etc.) INTERVENTIONS - Wound Cleansing / Measurement X - Simple Wound Cleansing - one wound 1 5 []  - 0 Complex Wound Cleansing - multiple  wounds X- 1 5 Wound Imaging (photographs - any number of wounds) []  - 0 Wound Tracing (instead of photographs) X- 1 5 Simple Wound Measurement - one wound []  - 0 Complex Wound Measurement - multiple wounds INTERVENTIONS - Wound Dressings X - Small Wound Dressing one or multiple wounds 1 10 []  - 0 Medium Wound Dressing one or multiple wounds []  - 0 Large Wound Dressing one or multiple wounds []  - 0 Application of Medications - topical []  - 0 Application of Medications - injection INTERVENTIONS - Miscellaneous []  - External ear exam 0 []  - 0 Specimen Collection (cultures, biopsies, blood, body fluids, etc.) []  - 0 Specimen(s) / Culture(s) sent or taken to Lab for analysis []  - 0 Patient Transfer (multiple staff / Civil Service fast streamer / Similar devices) []  - 0 Simple Staple / Suture removal (25 or less) []  - 0 Complex Staple / Suture removal (26 or more) []  - 0 Hypo / Hyperglycemic Management (close monitor of Blood Glucose) []  - 0 Ankle / Brachial Index (ABI) - do not check if billed separately X- 1 5 Vital Signs Jenifer, Donnis N. ([id number]) Has the patient been seen at the hospital within the last three years: Yes Total Score: 85 Level Of Care: New/Established - Level 3 Electronic Signature(s) Signed: 07/11/2019 4:43:58 PM By: Montey Hora Entered By: Montey Hora on 07/11/2019 09:58:51 Barich, [name] ([id number]) -------------------------------------------------------------------------------- Encounter Discharge Information Details Patient Name: [name] Date of Service: 07/11/2019 9:30 AM Medical Record Number: [id number] Patient Account Number: [id number] Date of Birth/Sex: October 25, 1991 (28 y.o. F) Treating RN: Montey Hora Primary Care Dvid Pendry: Eliezer Lofts Other Clinician: Referring Yazmeen Woolf: Eliezer Lofts Treating Koltyn Kelsay/Extender: Beverly Gust in Treatment: 2 Encounter Discharge Information Items Discharge Condition: Stable Ambulatory Status: Ambulatory Discharge Destination: Home Transportation: Private Auto Accompanied By: self Schedule Follow-up Appointment: No Clinical Summary of Care: Electronic Signature(s) Signed: 07/11/2019 10:11:00 AM By: Montey Hora Entered By: Montey Hora on 07/11/2019 10:11:00 [place]brugge, [name] ([id number]) -------------------------------------------------------------------------------- Lower Extremity Assessment Details Patient Name: [name] Date of Service: 07/11/2019 9:30 AM Medical Record Number: [id number] Patient Account Number: [id number] Date of Birth/Sex: [birth year] (28 y.o. F) Treating RN: Cornell Barman Primary Care Haron Beilke: Eliezer Lofts Other Clinician: Referring Lalaine Overstreet: Eliezer Lofts Treating Leinaala Catanese/Extender: Beverly Gust in Treatment: 2 Electronic Signature(s) Signed: 07/11/2019 3:53:21 PM By: Gretta Cool, BSN, RN, CWS, Kim RN, BSN Entered By: Gretta Cool, BSN, RN, CWS, Kim on 07/11/2019 09:47:26 Nakagawa, [name] ([id number]) -------------------------------------------------------------------------------- Multi Wound Chart Details Patient Name: [name] Date of Service: 07/11/2019 9:30 AM Medical Record Number: [id number] Patient Account Number: [id number] Date of Birth/Sex: 1991-11-08 (28 y.o. F) Treating RN: Montey Hora Primary Care Reeda Soohoo: Eliezer Lofts Other Clinician: Referring Treyven Lafauci: Eliezer Lofts Treating Amaka Gluth/Extender: Beverly Gust in Treatment: 2 Vital Signs Height(in): 67 Pulse(bpm): 87 Weight(lbs): 300 Blood Pressure(mmHg): 135/94 Body Mass Index(BMI): 47 Temperature(F): 98.2 Respiratory Rate 16 (breaths/min): Photos: [N/A:N/A] Wound Location: Right Sacrum N/A N/A Wounding Event: Pimple N/A N/A Primary Etiology: Pilonidal Cyst N/A N/A Date Acquired:  06/18/2019 N/A N/A Weeks of Treatment: 2 N/A N/A Wound Status: Healed - Epithelialized N/A N/A Measurements L x W x D 0x0x0 N/A N/A (cm) Area (cm) : 0 N/A N/A Volume (cm) : 0 N/A N/A % Reduction in Area: 100.00% N/A N/A % Reduction in Volume: 100.00% N/A N/A Classification: Partial Thickness N/A N/A Exudate Amount: None Present N/A N/A Wound Margin: Thickened N/A N/A Granulation Amount: Medium (34-66%) N/A N/A Granulation Quality: Pink N/A N/A Necrotic Amount: None  Present (0%) N/A N/A Exposed Structures: Fat Layer (Subcutaneous N/A N/A Tissue) Exposed: Yes Fascia: No Tendon: No Muscle: No Joint: No Bone: No Epithelialization: Large (67-100%) N/A N/A Treatment Notes ASHE, GRAYBEAL ([id number]) Electronic Signature(s) Signed: 07/11/2019 4:43:58 PM By: Montey Hora Entered By: Montey Hora on 07/11/2019 09:57:18 Silvestro, [name] ([id number]) -------------------------------------------------------------------------------- [place] Details Patient Name: [name] Date of Service: 07/11/2019 9:30 AM Medical Record Number: [id number] Patient Account Number: [id number] Date of Birth/Sex: May 15, 1991 (28 y.o. F) Treating RN: Montey Hora Primary Care Louine Tenpenny: Eliezer Lofts Other Clinician: Referring Afshin Chrystal: Eliezer Lofts Treating Eden Toohey/Extender: Beverly Gust in Treatment: 2 Active Inactive Electronic Signature(s) Signed: 07/11/2019 4:43:58 PM By: Montey Hora Entered By: Montey Hora on 07/11/2019 09:57:02 Starzyk, [name] ([id number]) -------------------------------------------------------------------------------- Pain Assessment Details Patient Name: [name] Date of Service: 07/11/2019 9:30 AM Medical Record Number: [id number] Patient Account Number: [id number] Date of Birth/Sex: 07-Dec-1991 (28 y.o. F) Treating RN: Cornell Barman Primary Care Luticia Tadros: Eliezer Lofts Other Clinician: Referring Lawsen Arnott: Eliezer Lofts Treating Kayelyn Lemon/Extender: Beverly Gust in Treatment: 2 Active Problems Location of Pain Severity and Description of Pain Patient Has Paino No Site Locations Pain Management and Medication Current Pain Management: Notes Patient denies pain at this time. Electronic Signature(s) Signed: 07/11/2019 3:53:21 PM By: Gretta Cool, BSN, RN, CWS, Kim RN, BSN Entered By: Gretta Cool, BSN, RN, CWS, Kim on 07/11/2019 09:43:08 Mcmannis, [name] ([id number]) -------------------------------------------------------------------------------- Patient/Caregiver Education Details Patient Name: [name] Date of Service: 07/11/2019 9:30 AM Medical Record Number: [id number] Patient Account Number: [id number] Date of Birth/Gender: March 11, 1991 (28 y.o. F) Treating RN: Montey Hora Primary Care Physician: Eliezer Lofts Other Clinician: Referring Physician: Eliezer Lofts Treating Physician/Extender: Beverly Gust in Treatment: 2 Education Assessment Education Provided To: Patient Education Topics Provided Basic Hygiene: Handouts: Other: continued care of pilodinadal cyst Methods: Explain/Verbal Responses: State content correctly Electronic Signature(s) Signed: 07/11/2019 4:43:58 PM By: Montey Hora Entered By: Montey Hora on 07/11/2019 10:10:35 Ohagan, [name] ([id number]) -------------------------------------------------------------------------------- Wound Assessment Details Patient Name: [name] Date of Service: 07/11/2019 9:30 AM Medical Record Number: [id number] Patient Account Number: [id number] Date of Birth/Sex: 1991-06-16 (28 y.o. F) Treating RN: Montey Hora Primary Care Abhinav Mayorquin: Eliezer Lofts Other Clinician: Referring Elye Harmsen: Eliezer Lofts Treating Tarena Gockley/Extender: Beverly Gust in Treatment: 2 Wound Status Wound Number: 1 Primary Etiology: Pilonidal Cyst Wound Location: Right Sacrum Wound Status: Healed - Epithelialized Wounding Event:  Pimple Date Acquired: 06/18/2019 Weeks Of Treatment: 2 Clustered Wound: No Photos Wound Measurements Length: (cm) 0 % Re Width: (cm) 0 % Re Depth: (cm) 0 Epit Area: (cm) 0 Tun Volume: (cm) 0 Und duction in Area: 100% duction in Volume: 100% helialization: Large (67-100%) neling: No ermining: No Wound Description Classification: Partial Thickness Foul Wound Margin: Thickened Slou Exudate Amount: None Present Odor After Cleansing: No gh/Fibrino Yes Wound Bed Granulation Amount: Medium (34-66%) Exposed Structure Granulation Quality: Pink Fascia Exposed: No Necrotic Amount: None Present (0%) Fat Layer (Subcutaneous Tissue) Exposed: Yes Tendon Exposed: No Muscle Exposed: No Joint Exposed: No Bone Exposed: No Electronic Signature(s) Signed: 07/11/2019 4:43:58 PM By: Montey Hora Entered By: Montey Hora on 07/11/2019 09:56:42 Lege, [name] ([id number]) Niebuhr, [name] ([id number]) -------------------------------------------------------------------------------- Vitals Details Patient Name: [name] Date of Service: 07/11/2019 9:30 AM Medical Record Number: [id number] Patient Account Number: [id number] Date of Birth/Sex: 1991/01/06 (28 y.o. F) Treating RN: Cornell Barman Primary Care Anysia Choi: Eliezer Lofts Other Clinician: Referring Monserrat Vidaurri: Eliezer Lofts Treating Latasha Puskas/Extender: Beverly Gust in Treatment: 2 Vital Signs  Time Taken: 09:43 Temperature (F): 98.2 Height (in): 67 Pulse (bpm): 87 Weight (lbs): 300 Respiratory Rate (breaths/min): 16 Body Mass Index (BMI): 47 Blood Pressure (mmHg): 135/94 Reference Range: 80 - 120 mg / dl Electronic Signature(s) Signed: 07/11/2019 3:53:21 PM By: Gretta Cool, BSN, RN, CWS, Kim RN, BSN Entered By: Gretta Cool, BSN, RN, CWS, Kim on 07/11/2019 09:43:26

## 2019-10-10 IMAGING — CR SACRUM AND COCCYX - 2+ VIEW
1 series · 3 of 3 positions shown · non-contrast
Comparison: None.

CLINICAL DATA: Pt has an open wound on her sacrum area and it was
open and drained and packed last week. She has a hx of these cyst on
the sacrum area.

EXAM:
SACRUM AND COCCYX - 2+ VIEW

[Series 1: dg sacrum/coccyx · 0.14mm/px · 3 of 3 slices shown]
[im 1/3]
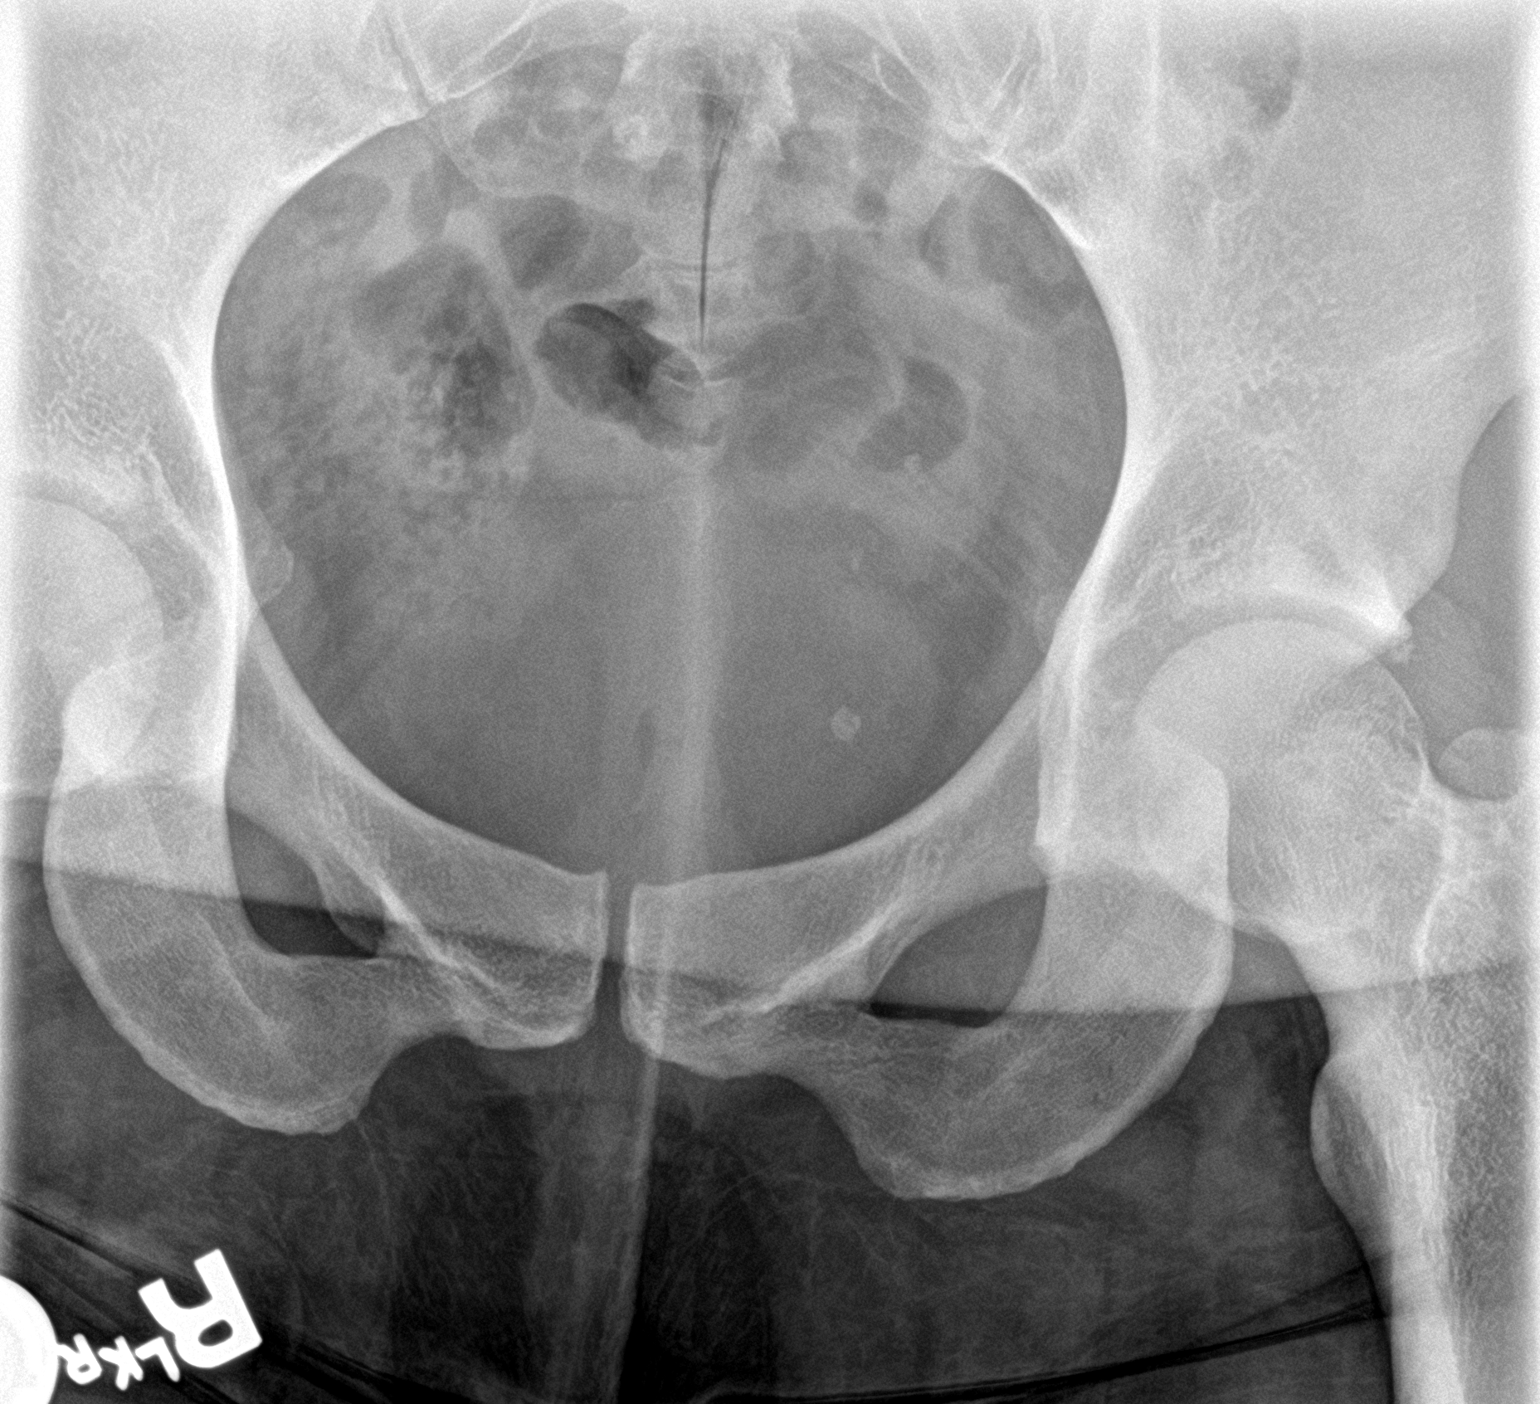
[im 2/3]
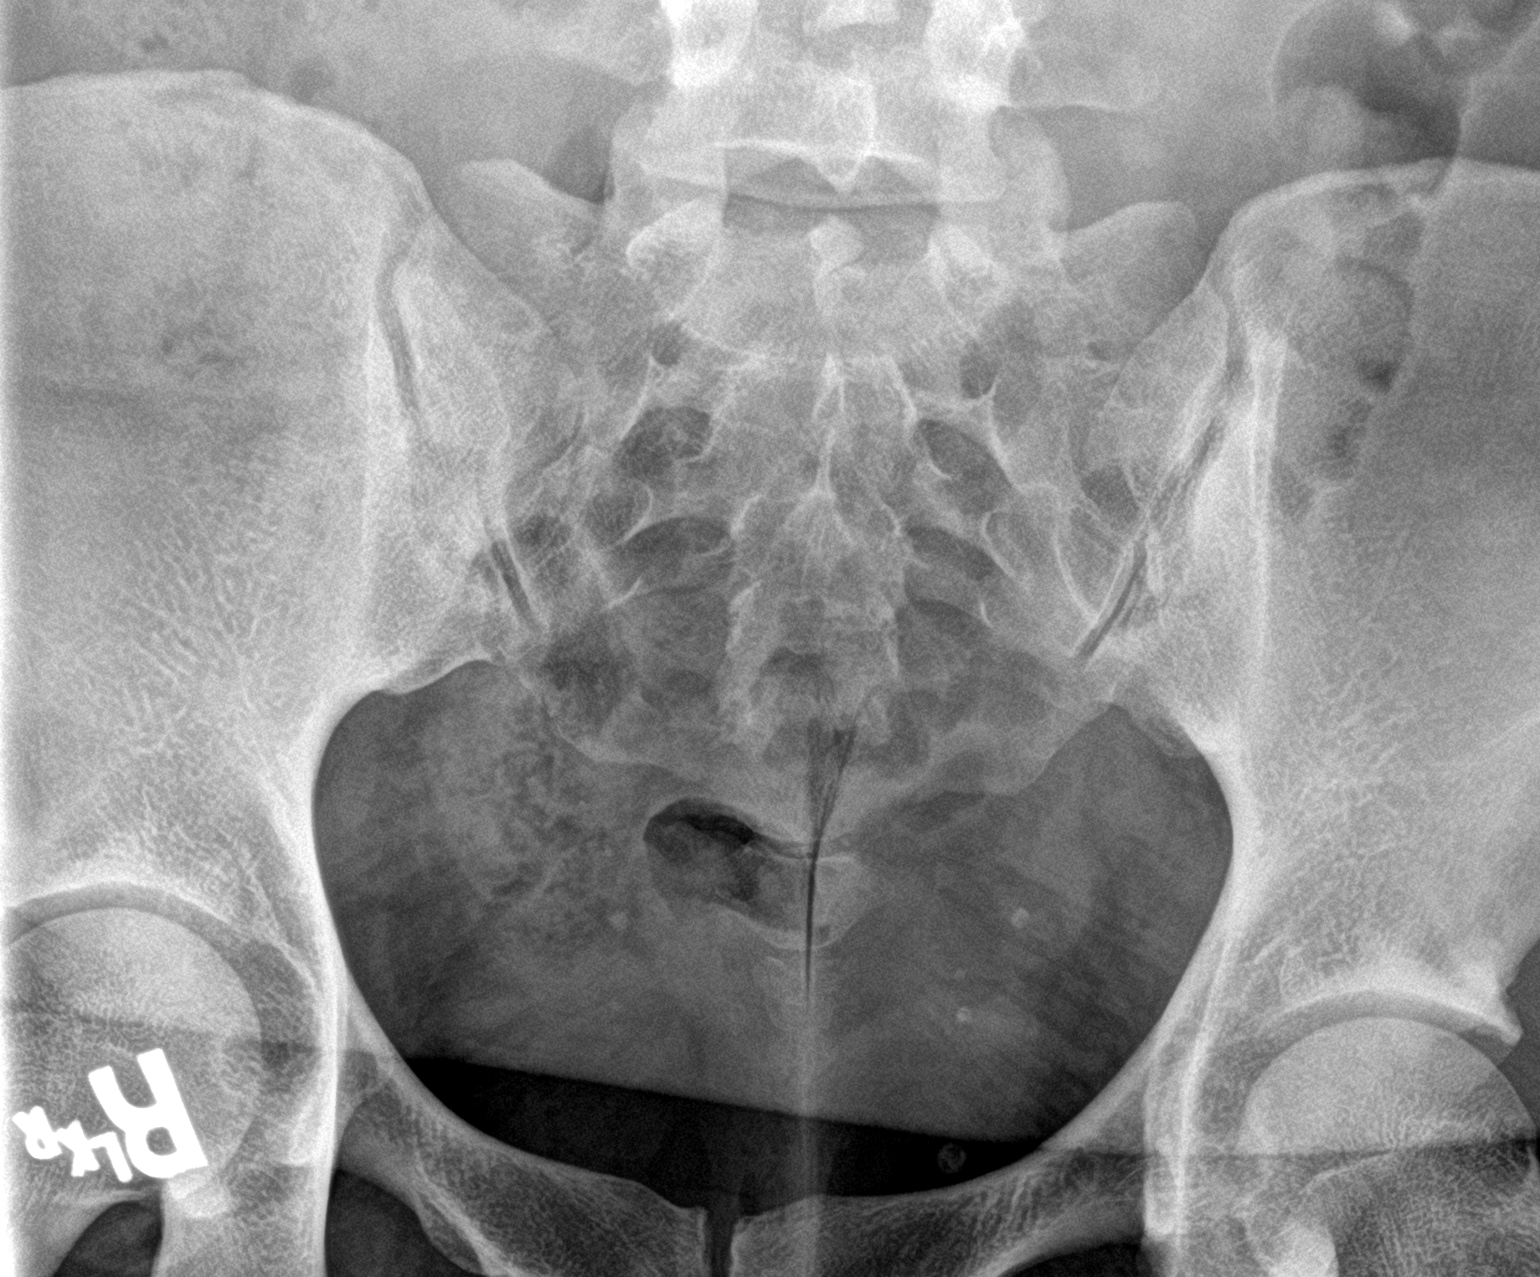
[im 3/3]
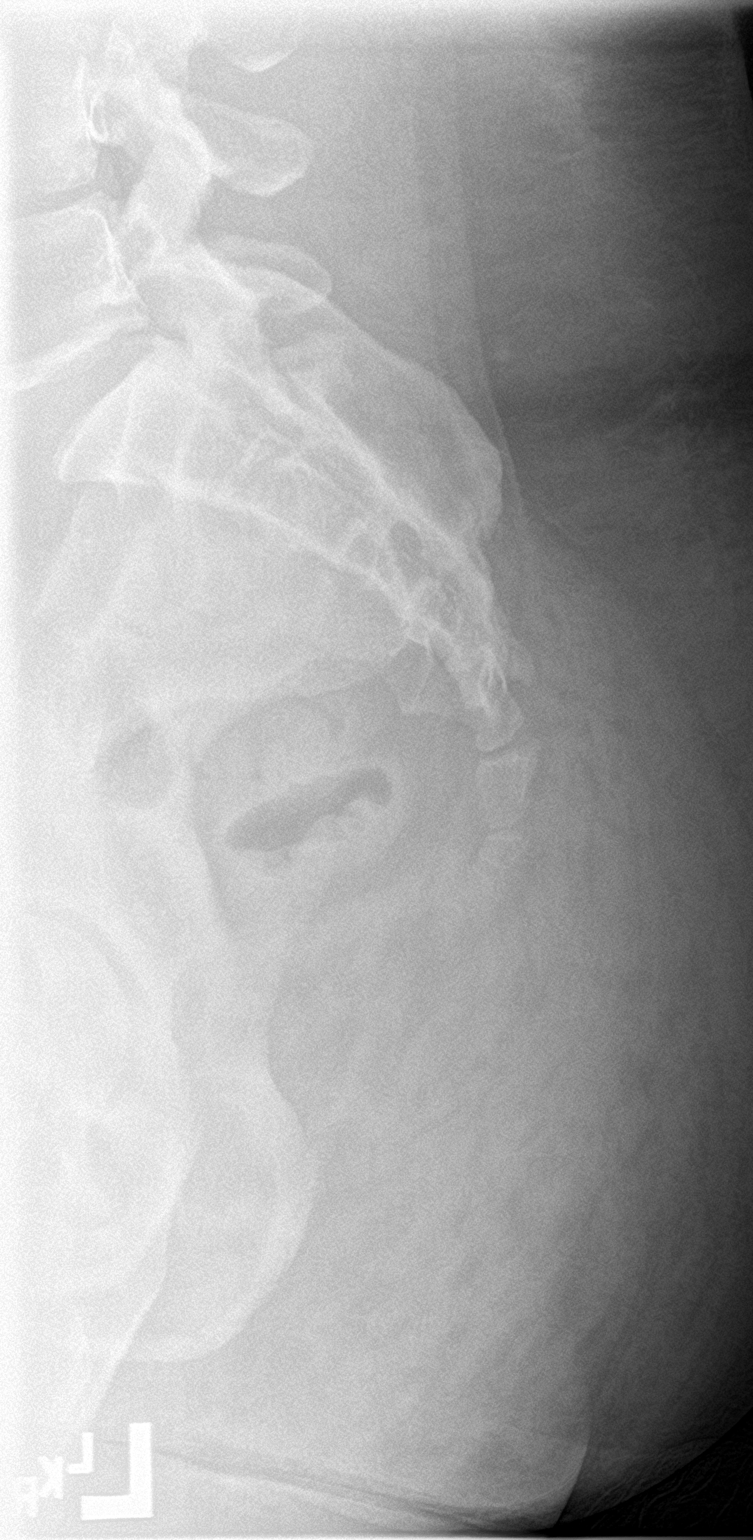

[3 of 3 positions shown; findings below may reference images not displayed]

FINDINGS: No fracture or bone lesion. There is no bone resorption to suggest
osteomyelitis. The SI joints, hip joints and pubic symphysis are
normally spaced and aligned. No arthropathic changes.

Posterior soft tissue edema suggested at and below the level of the
sacrum. No soft tissue air.
IMPRESSION: 1. No fracture, bone lesion or evidence of osteomyelitis. No soft
tissue air.

## 2019-11-21 ENCOUNTER — Encounter: Payer: Self-pay | Admitting: Primary Care

## 2019-11-21 ENCOUNTER — Other Ambulatory Visit: Payer: Self-pay

## 2019-11-21 ENCOUNTER — Ambulatory Visit: Payer: 59 | Admitting: Primary Care

## 2019-11-21 VITALS — BP 122/70 | HR 97 | Temp 97.5°F | Ht 67.25 in | Wt 305.2 lb

## 2019-11-21 DIAGNOSIS — L0591 Pilonidal cyst without abscess: Secondary | ICD-10-CM

## 2019-11-21 DIAGNOSIS — Z23 Encounter for immunization: Secondary | ICD-10-CM | POA: Diagnosis not present

## 2019-11-21 MED ORDER — AMOXICILLIN-POT CLAVULANATE 875-125 MG PO TABS: 1.0000 | ORAL_TABLET | Freq: Two times a day (BID) | ORAL | 0 refills | Status: DC

## 2019-11-21 NOTE — Assessment & Plan Note (Addendum)
Numerous occurrences, following with wound center this Summer.   Consent obtained. Site cleansed with betadine solution. Lidocaine 1% with epi used for analgesia.  Small incision made with 11 blade device.  Small amount of blood expelled, mild amount of puss. Site reduced overall. Site cleansed again with betadine solution. Dressing applied.  Cover with Augmentin course, treated with Augmentin per wound clinic this summer, see culture report. Referral placed to general surgery. Tetanus vaccination updated today.

## 2019-11-21 NOTE — Patient Instructions (Signed)
Start Augmentin antibiotics for the infection Take 1 tablet by mouth twice daily for 7 days.  Stop by the front desk and speak with either Rosaria Ferries or Charmaine regarding your referral to general surgery.  Keep the site dry, clean, and covered.  It was a pleasure meeting you!
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
## 2019-11-22 ENCOUNTER — Ambulatory Visit: Payer: 59 | Admitting: Surgery

## 2019-11-22 ENCOUNTER — Encounter: Payer: Self-pay | Admitting: Surgery

## 2019-11-22 VITALS — BP 144/85 | HR 93 | Temp 97.9°F | Ht 67.25 in | Wt 301.0 lb

## 2019-11-22 DIAGNOSIS — L0591 Pilonidal cyst without abscess: Secondary | ICD-10-CM

## 2019-11-22 MED ORDER — OXYCODONE HCL 5 MG PO TABS: 5.0000 mg | ORAL_TABLET | Freq: Four times a day (QID) | ORAL | 0 refills | Status: DC | PRN

## 2019-11-22 NOTE — Addendum Note (Signed)
Addended by: Jacqualin Combes on: 11/22/2019 12:26 PM   Modules accepted: Orders

## 2019-11-22 NOTE — Patient Instructions (Addendum)
Change your gauze dressing several times a day as needed.  Continue to take your antibiotics.  You may shower tomorrow after removing your dressing. Let the water run over the area and pat dry and redress.   You may use the prescription medication if needed. May use OTC Tylenol or Ibuprofen as needed.   Follow up here in one week.

## 2019-11-22 NOTE — Progress Notes (Signed)
11/22/2019  History of Present Illness: [name] is a 28 y.o. female presenting today for evaluation of recurrent pilonidal cyst abscess.  She was last seen by Dr. Rosana Hoes in 12/2018 and had discussed surgery with him, but her finances did not allow her to have surgery at the time.  She reports that she's had multiple episodes of the abscess recurring, and she's had multiple I&D procedures as well.  She noted that she was having another flareup and went to her PCP yesterday.  She reports noticing swelling and tenderness again, and noted a "bubble". She had an I&D at her PCP's clinic and was started on Augmentin.  She was referred to [place] again for further evaluation of the abscess.  Today, she reports that this morning she had a lot of fluid drainage.  Past Medical History: Past Medical History:  Diagnosis Date  . Migraine   . Pilonidal cyst   . Polycystic ovaries      Past Surgical History: Past Surgical History:  Procedure Laterality Date  . INCISION AND DRAINAGE     x3 pilonidal cyst    Home Medications: Prior to Admission medications   Medication Sig Start Date End Date Taking? Authorizing Provider  acetaminophen (TYLENOL) 650 MG CR tablet Take by mouth.   Yes [provider]  amoxicillin-clavulanate (AUGMENTIN) 875-125 MG tablet Take 1 tablet by mouth 2 (two) times daily. 11/21/19  Yes Pleas Koch, NP  cyclobenzaprine (FLEXERIL) 10 MG tablet Take 0.5-1 tablets (5-10 mg total) by mouth at bedtime. Patient taking differently: Take 5-10 mg by mouth 3 (three) times daily as needed.  03/14/19  Yes Bedsole, Amy E, MD  oxyCODONE (ROXICODONE) 5 MG immediate release tablet Take 1 tablet (5 mg total) by mouth every 6 (six) hours as needed for moderate pain or severe pain. 11/22/19 11/21/20  Olean Ree, MD    Allergies: No Known Allergies  Review of Systems: Review of Systems  Constitutional: Negative for chills and fever.  Respiratory: Negative for shortness of  breath.   Cardiovascular: Negative for chest pain.  Gastrointestinal: Negative for abdominal pain, nausea and vomiting.  Skin:       New drainage, swelling, tenderness upper gluteal cleft    Physical Exam BP (!) 144/85   Pulse 93   Temp 97.9 F (36.6 C)   Ht 5' 7.25" (1.708 m)   Wt (!) 301 lb (136.5 kg)   LMP 11/03/2019   SpO2 97%   BMI 46.79 kg/m  CONSTITUTIONAL: No acute distress HEENT:  Normocephalic, atraumatic, extraocular motion intact. RESPIRATORY:  Lungs are clear, and breath sounds are equal bilaterally. Normal respiratory effort without pathologic use of accessory muscles. CARDIOVASCULAR: Heart is regular without murmurs, gallops, or rubs. GI: The abdomen is soft obese, non-distended, non-tender.  SKIN:  Upper gluteal cleft shows an area of fluctuance to the right of midline.  There is some serous fluid that can be expressed when the area is squeezed.  This is tender to palpation with some surrounding erythema. She has some other scars in the buttocks which could have been prior cysts.  The I&D site has sealed.  There is one very small sinus pit about 3-4 cm inferior to the area of fluctuance, but I do not see any drainage from there or any hairs in the pit. NEUROLOGIC:  Motor and sensation is grossly normal.  Cranial nerves are grossly intact. PSYCH:  Alert and oriented to person, place and time. Affect is normal.  Labs/Imaging: None  Assessment  and Plan: This is a 28 y.o. female with recurrent pilonidal cyst abscess.  --Discussed with the patient that the I&D site appears to have sealed and currently there is no drainage.  We would have to repeat I&D today in order to allow this to drain better.  She is in agreement.  Risks of bleeding, infection, injury to surrounding structures were discussed and she's willing to proceed.   Procedure Date:  11/22/2019  Pre-operative Diagnosis:  Pilonidal cyst abscess  Post-operative Diagnosis:  Pilonidal cyst  abscess  Procedure:  Incision and Drainage of pilonidal cyst abscess  Surgeon:  Melvyn Neth, MD  Anesthesia:  8 ml of 1% lidocaine with epi.  Estimated Blood Loss:  10 ml  Specimens:  Culture swab  Complications:  None  Indications for Procedure:  This is a 28 y.o. female with diagnosis of recurrent pilonidal abscess, requiring drainage procedure.  The risks of bleeding, abscess or infection, injury to surrounding structures, and need for further procedures were all discussed with the patient and was willing to proceed.  Description of Procedure: The patient was correctly identified at bedside.  Appropriate time-outs were performed prior to procedure.  The patient's upper gluteal cleft was prepped and draped in usual sterile fashion.  Local anesthetic was infused intradermally.  A 1.5 cm incision was made over the abscess, revealing serosanguious fluid.  This fluid was swabbed for culture and sent to micro.  The cavity was probed with q-tip, and this cavity was moderate size.  This tracked medially across the cleft towards the medial left buttocks and inferiorly about 4 cm on the right medial buttocks.  Given the size of the cavity, it was decided to make an additional counter-incision about 4 cm inferior to the prior incision.  These were then looped with a 1/4 inch penrose drain, and the drain tails were ligated together using 2-0 Silk ties.  After drainage was completed, the cavity was irrigated and cleaned.  The wound was covered with dry gauze and tape.  The patient tolerated the procedure well and all sharps were appropriately disposed of at the end of the case.   --Patient should continue and complete her antibiotic course. --May shower and change dry gauze outer dressing daily and as needed.  No need to pack wounds with gauze. --Will follow up in a week for wound check and possible drain removal.   Melvyn Neth, MD [place]

## 2019-11-26 ENCOUNTER — Other Ambulatory Visit: Payer: Self-pay | Admitting: Surgery

## 2019-11-26 MED ORDER — SULFAMETHOXAZOLE-TRIMETHOPRIM 800-160 MG PO TABS: 1.0000 | ORAL_TABLET | Freq: Two times a day (BID) | ORAL | 0 refills | Status: DC

## 2019-11-26 NOTE — Progress Notes (Signed)
Wound culture grew MRSA, resistant to Augmentin.  Changing antibiotics to Bactrim.  Olean Ree, MD

## 2019-11-27 ENCOUNTER — Telehealth: Payer: Self-pay

## 2019-11-27 LAB — ANAEROBIC AND AEROBIC CULTURE

## 2019-11-27 NOTE — Telephone Encounter (Signed)
-----   Message from Olean Ree, MD sent at 11/26/2019  9:31 PM EST ----- Regarding: culture results Hi Caryl-Lyn,  This patient's cultures came back and it's growing MRSA.  It's resistant to Augmentin which is the antibiotic she had before.  I just sent a prescription for Bactrim DS for 7 day course.  Can you please let her know to stop the Augmentin and start the Bactrim instead?  Thanks!  [employer]

## 2019-11-27 NOTE — Telephone Encounter (Signed)
Spoke with patient to notify her that her recent results per Dr.Piscoya "patient's culture came back and it's growing MRSA. It's resistant to Augmentin which is the antibiotic she had before." Dr.Piscoya sent over a prescription for "Bactrim DS for 7 day course." Patient verbalized understanding and states she received a message via text msg informing her of a prescription ready for pick up.

## 2019-11-28 ENCOUNTER — Encounter: Payer: Self-pay | Admitting: Surgery

## 2019-11-28 ENCOUNTER — Ambulatory Visit (INDEPENDENT_AMBULATORY_CARE_PROVIDER_SITE_OTHER): Payer: 59 | Admitting: Surgery

## 2019-11-28 ENCOUNTER — Other Ambulatory Visit: Payer: Self-pay

## 2019-11-28 VITALS — BP 144/94 | HR 82 | Temp 97.7°F | Ht 67.0 in | Wt 297.6 lb

## 2019-11-28 DIAGNOSIS — L729 Follicular cyst of the skin and subcutaneous tissue, unspecified: Secondary | ICD-10-CM

## 2019-11-28 DIAGNOSIS — Z09 Encounter for follow-up examination after completed treatment for conditions other than malignant neoplasm: Secondary | ICD-10-CM

## 2019-11-28 NOTE — Progress Notes (Signed)
11/28/2019  HPI: SHARNIKA DROGE is a 28 y.o. female s/p I&D of a medial right upper buttocks cyst on 11/25 with placement of a penrose drain.  She had been given Augmentin on the day prior and her cultures came back with MRSA.  Her antibiotic was changed to Bactrim yesterday for a 7 day course.  She has been doing well.  Reports the pain is much improved and the drainage has slowed down.  She started taking the new antibiotic yesterday.  Vital signs: BP (!) 144/94   Pulse 82   Temp 97.7 F (36.5 C) (Temporal)   Ht 5\' 7"  (1.702 m)   Wt 297 lb 9.6 oz (135 kg)   LMP 11/03/2019   SpO2 96%   BMI 46.61 kg/m    Physical Exam: Constitutional: No acute distress Skin:  The upper right medial buttocks has two incisions with penrose drain looped between.  There is no further purulence and the erythema has subsided.  Wound cavity was probed with qtip showing much smaller size compared to last week.  There is maybe one small skin pore vs sinus pit in the cleft, but no definitely evidence of pilonidal cyst.  Assessment/Plan: This is a 28 y.o. female s/p I&D of right medial upper buttocks cyst.   Discussed with the patient that I would like to keep the drain in place for one more week to allow the wound to heal better and give the new antibiotic regimen time to complete its job.  We'll remove it next week.  Discussed with her that I do not really think this would have been a pilonidal cyst based on its location, and I do not really see a true pilonidal sinus or pit.  After this more thorough I&D done last week, she may not need any further procedures.  Discussed that if this were to recur, then it may be best to examine this in the OR so we can be more thorough.  Patient will follow up in a week for drain removal.   Melvyn Neth, [place]

## 2019-11-28 NOTE — Patient Instructions (Addendum)
Patient is to continue taking the Bactrim antibiotics before removing the drain.  Patient is to keep the drain for another week and the following week the drain will be removed.  Patient will follow up in one week with Dr.Piscoya.    Pilonidal Cyst Drainage, Care After This sheet gives you information about how to care for yourself after your procedure. Your health care provider may also give you more specific instructions. If you have problems or questions, contact your health care provider. What can I expect after the procedure? After the procedure, it is common to have:  Pain that gets better when you take medicine.  Some fluid or blood coming from your wound. Follow these instructions at home: Medicines  Take over-the-counter and prescription medicines only as told by your health care provider.  If you were prescribed an antibiotic medicine, take it as told by your health care provider. Do not stop taking the antibiotic even if you start to feel better. Lifestyle  Do not do activities that irritate or put pressure on your buttocks for about 2 weeks, or as long as told by your health care provider. These activities include bike riding, running, and anything that involves a twisting motion.  Do not sit for long periods at a time without getting up to move around.  Sleep on your side instead of your back.  Avoid wearing tight underwear and tight pants. Bathing  Do not take baths or showers, swim, or use a hot tub until your health care provider approves. This depends on the type of wound you have from surgery.  While bathing, clean your buttocks area gently with soap and water.  After bathing: ? Pat the area dry with a soft, clean towel. ? Cover the area with a clean bandage (dressing), if told to by your health care provider. General instructions   If you are taking prescription pain medicine, take actions to prevent or treat constipation. Your health care provider may  recommend that you: ? Drink enough fluid to keep your urine pale yellow. ? Eat foods that are high in fiber, such as fresh fruits and vegetables, whole grains, and beans. ? Limit foods that are high in fat and processed sugars, such as fried or sweet foods. ? Take an over-the-counter or prescription medicine for constipation.  You will need to have a caregiver help you manage wound care and dressing changes. Your caregiver should: ? Wash his or her hands with soap and water before changing your dressing. If soap and water are not available, your caregiver should use hand sanitizer. ? Check your wound every day for signs of infection, such as:  Redness, swelling, or more pain.  More fluid or blood.  Warmth.  Pus or a bad smell. ? Follow any additional instructions from your health care provider on how to care for your wound, such as wound cleaning, wound flushing (irrigation), or packing your wound with a dressing.  Keep all follow-up visits as told by your health care provider. This is important. If you had incision and drainage with wound packing:  Return to your health care provider as instructed to have your packing material changed or removed.  Keep the area dry until your packing has been removed.  After the packing has been removed, you may start taking showers. If you had marsupialization:  You may start taking showers the day after surgery, or when your health care provider approves.  Remove your dressing before you shower, but let the water  from the shower moisten your dressing before you remove it. This will make it easier to remove.  Ask your health care provider when you can stop using a dressing. If you had incision and drainage without wound packing:  Change your dressing as directed.  Leave stitches (sutures), skin glue, or adhesive strips in place. These skin closures may need to stay in place for 2 weeks or longer. If adhesive strip edges start to loosen and  curl up, you may trim the loose edges. Do not remove adhesive strips completely unless your health care provider tells you to do that. Contact a health care provider if:  You have redness, swelling, or more pain around your wound.  You have more fluid or blood coming from your wound.  You have new bleeding from your wound.  Your wound feels warm to the touch.  There is pus or a bad smell coming from your wound.  You have pain that does not get better with medicine.  You have a fever or chills.  You have muscle aches.  You are dizzy.  You feel generally sick. Summary  After a procedure to drain a pilonidal cyst, it is common to have some fluid or blood coming from your wound.  If you were prescribed an antibiotic medicine, take it as told by your health care provider. Do not stop taking the antibiotic even if you start to feel better.  Return to your health care provider as instructed to have any packing material changed or removed. This information is not intended to replace advice given to you by your health care provider. Make sure you discuss any questions you have with your health care provider. Document Released: 01/14/2007 Document Revised: 10/06/2018 Document Reviewed: 12/06/2017 Elsevier Patient Education  2020 [employer].

## 2019-12-01 ENCOUNTER — Ambulatory Visit: Payer: 59 | Admitting: Physician Assistant

## 2019-12-08 ENCOUNTER — Other Ambulatory Visit: Payer: Self-pay

## 2019-12-08 ENCOUNTER — Encounter: Payer: Self-pay | Admitting: Physician Assistant

## 2019-12-08 ENCOUNTER — Ambulatory Visit (INDEPENDENT_AMBULATORY_CARE_PROVIDER_SITE_OTHER): Payer: 59 | Admitting: Physician Assistant

## 2019-12-08 VITALS — BP 135/91 | HR 93 | Temp 97.2°F | Resp 14 | Ht 67.0 in | Wt 297.0 lb

## 2019-12-08 DIAGNOSIS — Z09 Encounter for follow-up examination after completed treatment for conditions other than malignant neoplasm: Secondary | ICD-10-CM | POA: Diagnosis not present

## 2019-12-08 DIAGNOSIS — L0591 Pilonidal cyst without abscess: Secondary | ICD-10-CM | POA: Diagnosis not present

## 2019-12-08 NOTE — Patient Instructions (Signed)
Please call the office if you have any questions or concerns.  Place gauze on the area for the next couple of days.

## 2019-12-08 NOTE — Progress Notes (Signed)
[facility] POST-OP OFFICE VISIT  12/08/2019  HPI: [name] is a 28 y.o. female 16 days s/p incision and drainage of pilonidal cyst abscess and placement of penrose drain with Dr Hampton Abbot. Cultures of this grew out MRSA and was switch to Bactrim. Followed up on 12/01 and doing well. Penrose was left in place at that time to allow switch in ABx to have time to take effect.   Today, she reports that she is doing well. No issues with fever, chills, or pain. She has completed her ABx without issues. She notices scant amount of clear, yellowish drainage. No other complaints.   Vital signs: There were no vitals taken for this visit.   Physical Exam: Constitutional: Well appearing female, NAD Skin: Two small incisions to the right superior buttock just lateral to the gluteal cleft, there is induration, no erythema, no drainage, penrose in place, this is non-tender.  Assessment/Plan: This is a 28 y.o. female 16 days s/p incision and drainage of pilonidal cyst abscess and placement of penrose drain   - Removed penrose drain  - Discussed local wound care + dry dressings prn  - Discussed return precautions; if this recurs may need more formal examination which may require being completed in OR  - rtc prn  -- Edison Simon, PA-C  [facility] 12/08/2019, 9:50 AM [phone] M-F: 7am - 4pm

## 2020-01-19 ENCOUNTER — Emergency Department
Admission: EM | Admit: 2020-01-19 | Discharge: 2020-01-19 | Disposition: A | Discharge status: Home / Self Care | Payer: 59 | Attending: Student in an Organized Health Care Education/Training Program | Admitting: Student in an Organized Health Care Education/Training Program

## 2020-01-19 ENCOUNTER — Encounter: Payer: Self-pay | Admitting: Emergency Medicine

## 2020-01-19 ENCOUNTER — Ambulatory Visit (INDEPENDENT_AMBULATORY_CARE_PROVIDER_SITE_OTHER): Payer: 59 | Admitting: Family Medicine

## 2020-01-19 ENCOUNTER — Encounter: Payer: Self-pay | Admitting: Family Medicine

## 2020-01-19 ENCOUNTER — Emergency Department: Payer: 59

## 2020-01-19 ENCOUNTER — Telehealth: Payer: Self-pay

## 2020-01-19 ENCOUNTER — Telehealth: Payer: Self-pay | Admitting: Surgery

## 2020-01-19 ENCOUNTER — Other Ambulatory Visit: Payer: Self-pay

## 2020-01-19 DIAGNOSIS — R509 Fever, unspecified: Secondary | ICD-10-CM | POA: Insufficient documentation

## 2020-01-19 DIAGNOSIS — L0501 Pilonidal cyst with abscess: Secondary | ICD-10-CM | POA: Diagnosis present

## 2020-01-19 DIAGNOSIS — L0591 Pilonidal cyst without abscess: Secondary | ICD-10-CM

## 2020-01-19 LAB — CBC WITH DIFFERENTIAL/PLATELET
Abs Immature Granulocytes: 0.03 K/uL (ref 0.00–0.07)
Basophils Absolute: 0 K/uL (ref 0.0–0.1)
Basophils Relative: 0 %
Eosinophils Absolute: 0.1 K/uL (ref 0.0–0.5)
Eosinophils Relative: 1 %
HCT: 34.3 % — ABNORMAL LOW (ref 36.0–46.0)
Hemoglobin: 10.6 g/dL — ABNORMAL LOW (ref 12.0–15.0)
Immature Granulocytes: 0 %
Lymphocytes Relative: 15 %
Lymphs Abs: 1.6 K/uL (ref 0.7–4.0)
MCH: 24.3 pg — ABNORMAL LOW (ref 26.0–34.0)
MCHC: 30.9 g/dL (ref 30.0–36.0)
MCV: 78.5 fL — ABNORMAL LOW (ref 80.0–100.0)
Monocytes Absolute: 1.1 K/uL — ABNORMAL HIGH (ref 0.1–1.0)
Monocytes Relative: 10 %
Neutro Abs: 8.1 K/uL — ABNORMAL HIGH (ref 1.7–7.7)
Neutrophils Relative %: 74 %
Platelets: 359 K/uL (ref 150–400)
RBC: 4.37 MIL/uL (ref 3.87–5.11)
RDW: 15.9 % — ABNORMAL HIGH (ref 11.5–15.5)
WBC: 10.9 K/uL — ABNORMAL HIGH (ref 4.0–10.5)
nRBC: 0 % (ref 0.0–0.2)

## 2020-01-19 LAB — URINALYSIS, COMPLETE (UACMP) WITH MICROSCOPIC
Bacteria, UA: NONE SEEN
Bilirubin Urine: NEGATIVE
Glucose, UA: NEGATIVE mg/dL
Ketones, ur: 5 mg/dL — AB
Leukocytes,Ua: NEGATIVE
Nitrite: NEGATIVE
Protein, ur: 30 mg/dL — AB
Specific Gravity, Urine: 1.034 — ABNORMAL HIGH (ref 1.005–1.030)
pH: 5 (ref 5.0–8.0)

## 2020-01-19 LAB — BASIC METABOLIC PANEL WITH GFR
Anion gap: 9 (ref 5–15)
BUN: 9 mg/dL (ref 6–20)
CO2: 25 mmol/L (ref 22–32)
Calcium: 9 mg/dL (ref 8.9–10.3)
Chloride: 107 mmol/L (ref 98–111)
Creatinine, Ser: 0.82 mg/dL (ref 0.44–1.00)
GFR calc Af Amer: >60 mL/min (ref >60)
GFR calc non Af Amer: >60 mL/min (ref >60)
Glucose, Bld: 90 mg/dL (ref 70–99)
Potassium: 3.8 mmol/L (ref 3.5–5.1)
Sodium: 141 mmol/L (ref 135–145)

## 2020-01-19 LAB — POCT PREGNANCY, URINE: Preg Test, Ur: NEGATIVE

## 2020-01-19 MED ORDER — CEPHALEXIN 500 MG PO CAPS: 500.0000 mg | ORAL_CAPSULE | Freq: Four times a day (QID) | ORAL | 0 refills | Status: AC

## 2020-01-19 MED ORDER — ACETAMINOPHEN 325 MG PO TABS
650.0000 mg | ORAL_TABLET | Freq: Once | ORAL | Status: AC
  Administered 2020-01-19: 650 mg via ORAL
  Filled 2020-01-19: qty 2

## 2020-01-19 MED ORDER — SODIUM CHLORIDE 0.9 % IV BOLUS: 1000.0000 mL | Freq: Once | INTRAVENOUS | Status: DC

## 2020-01-19 MED ORDER — CLINDAMYCIN PHOSPHATE 600 MG/50ML IV SOLN
600.0000 mg | Freq: Once | INTRAVENOUS | Status: AC
  Administered 2020-01-19: 600 mg via INTRAVENOUS
  Filled 2020-01-19: qty 50

## 2020-01-19 MED ORDER — IOHEXOL 300 MG/ML  SOLN
100.0000 mL | Freq: Once | INTRAMUSCULAR | Status: AC | PRN
  Administered 2020-01-19: 100 mL via INTRAVENOUS
  Filled 2020-01-19: qty 100

## 2020-01-19 MED ORDER — SULFAMETHOXAZOLE-TRIMETHOPRIM 800-160 MG PO TABS: 1.0000 | ORAL_TABLET | Freq: Two times a day (BID) | ORAL | 0 refills | Status: DC

## 2020-01-19 MED ORDER — LIDOCAINE HCL (PF) 1 % IJ SOLN
5.0000 mL | Freq: Once | INTRAMUSCULAR | Status: AC
  Administered 2020-01-19: 17:00:00 5 mL via INTRADERMAL
  Filled 2020-01-19: qty 5

## 2020-01-19 MED ORDER — LIDOCAINE HCL (PF) 1 % IJ SOLN
INTRAMUSCULAR | Status: AC
  Filled 2020-01-19: qty 5

## 2020-01-19 NOTE — Discharge Instructions (Signed)
Please begin Keflex and Bactrim tonight.  Please return to the emergency department on Sunday for recheck.

## 2020-01-19 NOTE — H&P (View-Only) (Signed)
This visit occurred during the SARS-CoV-2 public health emergency.  Safety protocols were in place, including screening questions prior to the visit, additional usage of staff PPE, and extensive cleaning of exam room while observing appropriate contact time as indicated for disinfecting solutions.  History of pilonidal cyst.  She had prev tx and was doing well until recently.  Today is worse.  Symptoms worsened in the last day or so.  No FCNAVD.  Temp 99 here now.  Draining some clear yellow fluid.  Significantly tender to palpation locally.  Meds, vitals, and allergies reviewed.   ROS: Per HPI unless specifically indicated in ROS section   Uncomfortable R sided pilonidal cyst with peripheral induration that extends from the R buttock up over the gluteal crease to the L buttock.

## 2020-01-19 NOTE — Patient Instructions (Signed)
Go to the [facility] ER and we'll call ahead.  Take care.  Glad to see you. No charge for visit.

## 2020-01-19 NOTE — Telephone Encounter (Signed)
Spoke w/Dr. Damita Dunnings in regards to recurrent pilonidal cyst very inflamed, very painful & involving the gluteal cleft; was advised to send patient to [facility] ED for evaluation & treatment, as there are not providers in the office knowing the office closes @ 3 pm.

## 2020-01-19 NOTE — Telephone Encounter (Signed)
Pt came to clinic today requesting apt for pilonidal cyst, hx 4yr. Pt reports pain of 9 with some clear drainage earlier today. Pt denies fever.

## 2020-01-19 NOTE — ED Triage Notes (Signed)
Pt to ED, pt sent by her PCP for a cyst on her tailbone. Pt states that she was seen at her PCP office but they told her to come here because they were not able to handle it in office. Pt is in NAD at this time.

## 2020-01-19 NOTE — ED Provider Notes (Signed)
[facility] Emergency Department Provider Note  ____________________________________________  Time seen: Approximately 4:14 PM  I have reviewed the triage vital signs and the nursing notes.   HISTORY  Chief Complaint Abscess    HPI [name] is a 29 y.o. female that presents to the emergency department for evaluation of possible pilonidal cyst.  Cyst has become larger and more painful over the last day. Patient states that cyst has been there for about 1 year.  She was evaluated by wound care for cyst around November.  She had a drain put in it at one point and was told to follow-up with surgery if cyst returned.  She went to her primary care today and was referred to the emergency department for further evaluation.  Patient is unsure of fever but states that her temperature on arrival to the emergency department was 99.6. No hx of diabetes.   Past Medical History:  Diagnosis Date  . Migraine   . Pilonidal cyst   . Polycystic ovaries     Patient Active Problem List   Diagnosis Date Noted  . Fever 02/10/2019  . Pilonidal cyst 09/13/2018  . IBS (irritable bowel syndrome) 01/28/2018  . Laryngitis 12/31/2017  . First degree ankle sprain, left, subsequent encounter 10/26/2017  . Depression, major, single episode, moderate ([place]) 07/23/2017  . Chronic upper back pain 06/11/2016  . Morbid obesity ([place]) 06/26/2014  . Encounter for counseling regarding contraception 06/16/2013  . NONSPEC REACT TUBERCULIN SKN TEST W/O ACTV TB 10/21/2009  . POLYCYSTIC OVARIAN DISEASE 04/15/2009  . ALLERGIC RHINITIS 04/15/2009  . MIGRAINES, HX OF 04/15/2009    Past Surgical History:  Procedure Laterality Date  . INCISION AND DRAINAGE     x3 pilonidal cyst    Prior to Admission medications   Medication Sig Start Date End Date Taking? Authorizing Provider  acetaminophen (TYLENOL) 650 MG CR tablet Take by mouth.    [provider]  cephALEXin (KEFLEX) 500 MG  capsule Take 1 capsule (500 mg total) by mouth 4 (four) times daily for 10 days. 01/19/20 01/29/20  Laban Emperor, PA-C  cyclobenzaprine (FLEXERIL) 10 MG tablet Take 0.5-1 tablets (5-10 mg total) by mouth at bedtime. Patient taking differently: Take 5-10 mg by mouth 3 (three) times daily as needed.  03/14/19   Bedsole, Amy E, MD  sulfamethoxazole-trimethoprim (BACTRIM DS) 800-160 MG tablet Take 1 tablet by mouth 2 (two) times daily. 01/19/20   Laban Emperor, PA-C    Allergies Patient has no known allergies.  Family History  Problem Relation Age of Onset  . Leukemia Mother 81  . Hypertension Father     Social History Social History   Tobacco Use  . Smoking status: Never Smoker  . Smokeless tobacco: Never Used  Substance Use Topics  . Alcohol use: Yes    Comment: seldom, less than one drink weekly  . Drug use: No     Review of Systems  Constitutional: No chills Respiratory: No SOB. Gastrointestinal: No abdominal pain.  No nausea, no vomiting.  Musculoskeletal: Negative for musculoskeletal pain. Skin: Negative for abrasions, lacerations, ecchymosis. Positive for rash. Neurological: Negative for numbness or tingling   ____________________________________________   PHYSICAL EXAM:  VITAL SIGNS: ED Triage Vitals  Enc Vitals Group     BP 01/19/20 1528 (!) 149/76     Pulse Rate 01/19/20 1528 100     Resp 01/19/20 1528 16     Temp 01/19/20 1528 99.6 F (37.6 C)     Temp Source  01/19/20 1528 Oral     SpO2 01/19/20 1528 95 %     Weight 01/19/20 1527 300 lb (136.1 kg)     Height 01/19/20 1527 5\' 7"  (1.702 m)     Head Circumference --      Peak Flow --      Pain Score 01/19/20 1526 8     Pain Loc --      Pain Edu? --      Excl. in [place]? --      Constitutional: Alert and oriented. Well appearing and in no acute distress. Eyes: Conjunctivae are normal. PERRL. EOMI. Head: Atraumatic. ENT:      Ears:      Nose: No congestion/rhinnorhea.      Mouth/Throat: Mucous  membranes are moist.  Neck: No stridor.  Cardiovascular: Normal rate, regular rhythm.  Good peripheral circulation. Respiratory: Normal respiratory effort without tachypnea or retractions. Lungs CTAB. Good air entry to the bases with no decreased or absent breath sounds. Gastrointestinal: Bowel sounds 4 quadrants. Soft and nontender to palpation. No guarding or rigidity. No palpable masses. No distention.  Musculoskeletal: Full range of motion to all extremities. No gross deformities appreciated. Neurologic:  Normal speech and language. No gross focal neurologic deficits are appreciated.  Skin:  Skin is warm, dry and intact.  2 cm x 2 cm area of swelling and fluctuance to right gluteal cleft with 3 inches of surrounding induration and erythema that extends to left gluteal cleft. Psychiatric: Mood and affect are normal. Speech and behavior are normal. Patient exhibits appropriate insight and judgement.   ____________________________________________   LABS (all labs ordered are listed, but only abnormal results are displayed)  Labs Reviewed  CBC WITH DIFFERENTIAL/PLATELET - Abnormal; Notable for the following components:      Result Value   WBC 10.9 (*)    Hemoglobin 10.6 (*)    HCT 34.3 (*)    MCV 78.5 (*)    MCH 24.3 (*)    RDW 15.9 (*)    Neutro Abs 8.1 (*)    Monocytes Absolute 1.1 (*)    All other components within normal limits  URINALYSIS, COMPLETE (UACMP) WITH MICROSCOPIC - Abnormal; Notable for the following components:   Color, Urine YELLOW (*)    APPearance HAZY (*)    Specific Gravity, Urine 1.034 (*)    Hgb urine dipstick SMALL (*)    Ketones, ur 5 (*)    Protein, ur 30 (*)    All other components within normal limits  BASIC METABOLIC PANEL  POC URINE PREG, ED  POCT PREGNANCY, URINE   ____________________________________________  EKG   ____________________________________________  RADIOLOGY Robinette Haines, personally viewed and evaluated these images  (plain radiographs) as part of my medical decision making, as well as reviewing the written report by the radiologist.  CT ABDOMEN PELVIS W CONTRAST  Result Date: 01/19/2020 CLINICAL DATA:  29 year old female with perianal abscess. EXAM: CT ABDOMEN AND PELVIS WITH CONTRAST TECHNIQUE: Multidetector CT imaging of the abdomen and pelvis was performed using the standard protocol following bolus administration of intravenous contrast. CONTRAST:  151mL OMNIPAQUE IOHEXOL 300 MG/ML  SOLN COMPARISON:  None. FINDINGS: Lower chest: The visualized lung bases are clear. No intra-abdominal free air or free fluid. Hepatobiliary: Diffuse fatty infiltration of the liver. No intrahepatic biliary ductal dilatation. The gallbladder is unremarkable. Pancreas: Unremarkable. No pancreatic ductal dilatation or surrounding inflammatory changes. Spleen: Normal in size without focal abnormality. Adrenals/Urinary Tract: The adrenal glands are unremarkable. There is no hydronephrosis  on either side. Subcentimeter left renal hypodense lesions too small to characterize. The visualized ureters appear unremarkable. The urinary bladder is minimally distended. There is apparent diffuse thickening of the bladder wall which may be partly related to underdistention. Cystitis is not excluded. Correlation with urinalysis recommended. Stomach/Bowel: Small scattered colonic diverticula without active inflammatory changes. There is no bowel obstruction or active inflammation. The appendix is normal. Vascular/Lymphatic: The abdominal aorta and IVC are unremarkable. No portal venous gas. There is no adenopathy. Reproductive: The uterus is anteverted and grossly unremarkable. No adnexal masses. Other: There is a 6.4 x 3.5 by 5.0 cm collection with stranding of the adjacent fat and a small pocket of air in the subcutaneous soft tissues of the posterior sacral region immediately above the intergluteal cleft most consistent with an infected collection or a  pilonidal abscess. Clinical correlation is recommended. Musculoskeletal: No acute or significant osseous findings. IMPRESSION: Infected collection/pilonidal abscess in the subcutaneous soft tissues of the posterior sacrum along the superior aspect of the intergluteal cleft. Clinical correlation is recommended. Electronically Signed   By: Anner Crete M.D.   On: 01/19/2020 18:11    ____________________________________________    PROCEDURES  Procedure(s) performed:    Procedures  INCISION AND DRAINAGE Performed by: Laban Emperor Consent: Verbal consent obtained. Risks and benefits: risks, benefits and alternatives were discussed Type: abscess  Body area: superior right gluteal cleft  Anesthesia: local infiltration  Incision was made with a scalpel.  Local anesthetic: lidocaine 1 % without epinephrine  Anesthetic total: 5 ml  Complexity: complex Blunt dissection to break up loculations  Drainage: purulent  Drainage amount: moderate  Packing material: 1/4 in iodoform gauze  Patient tolerance: Patient tolerated the procedure well with no immediate complications.    Medications  clindamycin (CLEOCIN) IVPB 600 mg (0 mg Intravenous Stopped 01/19/20 1659)  lidocaine (PF) (XYLOCAINE) 1 % injection 5 mL (5 mLs Intradermal Given by Other 01/19/20 1705)  iohexol (OMNIPAQUE) 300 MG/ML solution 100 mL (100 mLs Intravenous Contrast Given 01/19/20 1746)  acetaminophen (TYLENOL) tablet 650 mg (650 mg Oral Given 01/19/20 1812)     ____________________________________________   INITIAL IMPRESSION / ASSESSMENT AND PLAN / ED COURSE  Pertinent labs & imaging results that were available during my care of the patient were reviewed by me and considered in my medical decision making (see chart for details).  Review of the [place] CSRS was performed in accordance of the [place] prior to dispensing any controlled drugs.   Patient's diagnosis is consistent with pilonidal abscess.  Exam and CT  scan are consistent with pilonidal abscess.  Abscess was successfully drained in the emergency department with a moderate to significant amount of purulent drainage.  Abscess was packed.  Initial attempt at incision and drainage was unsuccessful.  Abscess was successfully drained on second attempt.  Patient was given IV clindamycin for infection.  She is tolerating fluids and will drink a large bottle of Gatorade on her way home.  Patient was given Tylenol for pain and low-grade fever.  Patient will be discharged home with prescriptions for clindamycin. Patient is to follow up with the emergency department in 48 hours for recheck.  However, patient will return to the emergency department tomorrow if abscess is worsening following incision and drainage and antibiotics.  Patient is agreeable with this plan.  Patient is given ED precautions to return to the ED for any worsening or new symptoms.   [name] was evaluated in Emergency Department on 01/20/2020 for the symptoms  described in the history of present illness. She was evaluated in the context of the global COVID-19 pandemic, which necessitated consideration that the patient might be at risk for infection with the SARS-CoV-2 virus that causes COVID-19. Institutional protocols and algorithms that pertain to the evaluation of patients at risk for COVID-19 are in a state of rapid change based on information released by regulatory bodies including the CDC and federal and state organizations. These policies and algorithms were followed during the patient's care in the ED.  ____________________________________________  FINAL CLINICAL IMPRESSION(S) / ED DIAGNOSES  Final diagnoses:  Pilonidal abscess      NEW MEDICATIONS STARTED DURING THIS VISIT:  ED Discharge Orders         Ordered    sulfamethoxazole-trimethoprim (BACTRIM DS) 800-160 MG tablet  2 times daily     01/19/20 1900    cephALEXin (KEFLEX) 500 MG capsule  4 times daily      01/19/20 1900              This chart was dictated using voice recognition software/Dragon. Despite best efforts to proofread, errors can occur which can change the meaning. Any change was purely unintentional.    Laban Emperor, PA-C 01/20/20 1543    Merlyn Lot, MD 01/23/20 1304

## 2020-01-19 NOTE — Progress Notes (Signed)
This visit occurred during the SARS-CoV-2 public health emergency.  Safety protocols were in place, including screening questions prior to the visit, additional usage of staff PPE, and extensive cleaning of exam room while observing appropriate contact time as indicated for disinfecting solutions.  History of pilonidal cyst.  She had prev tx and was doing well until recently.  Today is worse.  Symptoms worsened in the last day or so.  No FCNAVD.  Temp 99 here now.  Draining some clear yellow fluid.  Significantly tender to palpation locally.  Meds, vitals, and allergies reviewed.   ROS: Per HPI unless specifically indicated in ROS section   Uncomfortable R sided pilonidal cyst with peripheral induration that extends from the R buttock up over the gluteal crease to the L buttock.

## 2020-01-19 NOTE — Telephone Encounter (Signed)
Per Dr. Damita Dunnings, place pt on schedule and he will see pt in office.  Scheduled pt for 3 pm

## 2020-01-19 NOTE — ED Triage Notes (Signed)
FIRST NURSE NOTE- pt sent from PCP. Hx of pilonidal cyst.  Today abscess from right gluteal crease across left. Sent for possible surgical I&D.

## 2020-01-21 ENCOUNTER — Emergency Department
Admission: EM | Admit: 2020-01-21 | Discharge: 2020-01-21 | Disposition: A | Discharge status: Home / Self Care | Payer: 59 | Attending: Student | Admitting: Student

## 2020-01-21 ENCOUNTER — Other Ambulatory Visit: Payer: Self-pay

## 2020-01-21 DIAGNOSIS — Z5189 Encounter for other specified aftercare: Secondary | ICD-10-CM

## 2020-01-21 DIAGNOSIS — L0501 Pilonidal cyst with abscess: Secondary | ICD-10-CM | POA: Insufficient documentation

## 2020-01-21 DIAGNOSIS — Z4800 Encounter for change or removal of nonsurgical wound dressing: Secondary | ICD-10-CM | POA: Insufficient documentation

## 2020-01-21 LAB — POCT PREGNANCY, URINE: Preg Test, Ur: NEGATIVE

## 2020-01-21 NOTE — ED Triage Notes (Signed)
Pt presents via POV for wound recheck. Reports had I&D on Friday and instructed to have wound rechecked today.

## 2020-01-21 NOTE — Discharge Instructions (Signed)
Please continue taking your antibiotics.  Please call general surgery in the morning for a follow-up appointment within 48 hours.

## 2020-01-21 NOTE — Assessment & Plan Note (Signed)
I called [facility] and was advised to send patient to ER for eval and possible surgery consult.  App help of all involved.  D/w pt.  I called ahead to ER and triage is aware of pending arrival at [facility].  D/w pt. she agrees with plan.

## 2020-01-21 NOTE — ED Notes (Signed)
Pt states that she is here for recheck on abscess that was drained on Friday. Pt states that there is a lot of drainage coming from the area. Pt states that pain has gotten better. Pt denies any complaints at this time.

## 2020-01-21 NOTE — ED Provider Notes (Signed)
[facility] Emergency Department Provider Note  ____________________________________________  Time seen: Approximately 3:36 PM  I have reviewed the triage vital signs and the nursing notes.   HISTORY  Chief Complaint Abscess    HPI [name] is a 29 y.o. female that presents to the emergency department for evaluation of pilonidal abscess recheck.  Abscess was drained in this emergency department 2 days ago.  Patient states that she had a low-grade fever when she left the emergency department 2 days ago but has not had any since.  She has not had any chills.  Abscess is still draining blood and clear/yellow discharge.  Pain has significantly improved.  Before, she was having pain out toward her hips and all of this has resolved.  She feels well overall.  She is taking her antibiotics as prescribed.  Past Medical History:  Diagnosis Date  . Migraine   . Pilonidal cyst   . Polycystic ovaries     Patient Active Problem List   Diagnosis Date Noted  . Fever 02/10/2019  . Pilonidal cyst 09/13/2018  . IBS (irritable bowel syndrome) 01/28/2018  . Laryngitis 12/31/2017  . First degree ankle sprain, left, subsequent encounter 10/26/2017  . Depression, major, single episode, moderate ([place]) 07/23/2017  . Chronic upper back pain 06/11/2016  . Morbid obesity ([place]) 06/26/2014  . Encounter for counseling regarding contraception 06/16/2013  . NONSPEC REACT TUBERCULIN SKN TEST W/O ACTV TB 10/21/2009  . POLYCYSTIC OVARIAN DISEASE 04/15/2009  . ALLERGIC RHINITIS 04/15/2009  . MIGRAINES, HX OF 04/15/2009    Past Surgical History:  Procedure Laterality Date  . INCISION AND DRAINAGE     x3 pilonidal cyst    Prior to Admission medications   Medication Sig Start Date End Date Taking? Authorizing Provider  acetaminophen (TYLENOL) 650 MG CR tablet Take by mouth.    [provider]  cephALEXin (KEFLEX) 500 MG capsule Take 1 capsule (500 mg total) by mouth  4 (four) times daily for 10 days. 01/19/20 01/29/20  Laban Emperor, PA-C  cyclobenzaprine (FLEXERIL) 10 MG tablet Take 0.5-1 tablets (5-10 mg total) by mouth at bedtime. Patient taking differently: Take 5-10 mg by mouth 3 (three) times daily as needed.  03/14/19   Bedsole, Amy E, MD  sulfamethoxazole-trimethoprim (BACTRIM DS) 800-160 MG tablet Take 1 tablet by mouth 2 (two) times daily. 01/19/20   Laban Emperor, PA-C    Allergies Patient has no known allergies.  Family History  Problem Relation Age of Onset  . Leukemia Mother 14  . Hypertension Father     Social History Social History   Tobacco Use  . Smoking status: Never Smoker  . Smokeless tobacco: Never Used  Substance Use Topics  . Alcohol use: Yes    Comment: seldom, less than one drink weekly  . Drug use: No     Review of Systems  Constitutional: No fever/chills Gastrointestinal: No abdominal pain.  No nausea, no vomiting.  Musculoskeletal: Negative for musculoskeletal pain. Skin: Negative for abrasions, lacerations, ecchymosis.   ____________________________________________   PHYSICAL EXAM:  VITAL SIGNS: ED Triage Vitals  Enc Vitals Group     BP 01/21/20 1438 (!) 144/72     Pulse Rate 01/21/20 1438 (!) 104     Resp 01/21/20 1438 18     Temp 01/21/20 1438 98.4 F (36.9 C)     Temp Source 01/21/20 1438 Oral     SpO2 01/21/20 1438 97 %     Weight 01/21/20 1438 300 lb (136.1 kg)  Height 01/21/20 1438 5\' 7"  (1.702 m)     Head Circumference --      Peak Flow --      Pain Score 01/21/20 1437 0     Pain Loc --      Pain Edu? --      Excl. in [place]? --      Constitutional: Alert and oriented. Well appearing and in no acute distress. Eyes: Conjunctivae are normal. PERRL. EOMI. Head: Atraumatic. ENT:      Ears:      Nose: No congestion/rhinnorhea.      Mouth/Throat: Mucous membranes are moist.  Neck: No stridor.   Cardiovascular: Good peripheral circulation. Respiratory: Normal respiratory effort  without tachypnea or retractions. Gastrointestinal: Soft and nontender to palpation. No guarding or rigidity. No palpable masses. No distention.  Musculoskeletal: Full range of motion to all extremities. No gross deformities appreciated. Neurologic:  Normal speech and language. No gross focal neurologic deficits are appreciated.  Skin:  Skin is warm, dry. 1cm by 1cm area of induration to right superior gluteal cleft with central incision and packing in place. Minimal tenderness to palpation. Minimal drainage to packing material. No foul odor. No overlying erythema. Psychiatric: Mood and affect are normal. Speech and behavior are normal. Patient exhibits appropriate insight and judgement.   ____________________________________________   LABS (all labs ordered are listed, but only abnormal results are displayed)  Labs Reviewed - No data to display ____________________________________________  EKG   ____________________________________________  RADIOLOGY   No results found.  ____________________________________________    PROCEDURES  Procedure(s) performed:    Procedures    Medications - No data to display   ____________________________________________   INITIAL IMPRESSION / ASSESSMENT AND PLAN / ED COURSE  Pertinent labs & imaging results that were available during my care of the patient were reviewed by me and considered in my medical decision making (see chart for details).  Review of the [place] CSRS was performed in accordance of the [place] prior to dispensing any controlled drugs.   Patient presents emergency department for abscess recheck.  Vital signs and exam are reassuring.  Abscess has significantly improved since 2 days ago.  Patient no longer has any overlying cellulitis.  She has a small amount of induration. Patient feels well. Abscess was repacked.  She will continue taking antibiotics as prescribed.  Patient is to follow up with general surgery as directed.  Patient is given ED precautions to return to the ED for any worsening or new symptoms.   [name] was evaluated in Emergency Department on 01/21/2020 for the symptoms described in the history of present illness. She was evaluated in the context of the global COVID-19 pandemic, which necessitated consideration that the patient might be at risk for infection with the SARS-CoV-2 virus that causes COVID-19. Institutional protocols and algorithms that pertain to the evaluation of patients at risk for COVID-19 are in a state of rapid change based on information released by regulatory bodies including the CDC and federal and state organizations. These policies and algorithms were followed during the patient's care in the ED.  ____________________________________________  FINAL CLINICAL IMPRESSION(S) / ED DIAGNOSES  Final diagnoses:  Visit for wound check  Pilonidal abscess      NEW MEDICATIONS STARTED DURING THIS VISIT:  ED Discharge Orders    None          This chart was dictated using voice recognition software/Dragon. Despite best efforts to proofread, errors can occur which can change the meaning. Any  change was purely unintentional.    Laban Emperor, PA-C 01/21/20 2102    Lilia Pro., MD 01/21/20 2223

## 2020-01-22 ENCOUNTER — Ambulatory Visit (INDEPENDENT_AMBULATORY_CARE_PROVIDER_SITE_OTHER): Payer: 59 | Admitting: Surgery

## 2020-01-22 ENCOUNTER — Encounter: Payer: Self-pay | Admitting: Surgery

## 2020-01-22 ENCOUNTER — Other Ambulatory Visit: Payer: Self-pay

## 2020-01-22 VITALS — BP 147/99 | HR 92 | Temp 97.5°F | Resp 12 | Ht 67.0 in | Wt 303.4 lb

## 2020-01-22 DIAGNOSIS — L0591 Pilonidal cyst without abscess: Secondary | ICD-10-CM | POA: Diagnosis not present

## 2020-01-22 NOTE — Progress Notes (Signed)
01/22/2020  History of Present Illness: [name] is a 29 y.o. female presenting for follow up of a pilonidal cyst.  She has history of recurrent pilonidal cyst with abscess and has had multiple prior I&D procedures.  I performed her last one in 11/25, leaving a penrose drain looped between two counter incisions.  This healed well and the patient reports that she had been doing well.  She would occasionally feel some pressure but never amounted to infection.  However, she had a new flare-up with worsening pain and erythema/induration and presented to the ED on 1/22.  She had a CT scan which showed a new collection measuring 6.4 x 3.5 x 5 cm in size.  She underwent I&D in the ED and had 1/4 inch gauze packing.  Wound check was done in the ED on 1/25 and was referred again to surgery.  She's currently on Keflex and Bactrim.  She reports the pain is much better.  Past Medical History: Past Medical History:  Diagnosis Date  . Migraine   . Pilonidal cyst   . Polycystic ovaries      Past Surgical History: Past Surgical History:  Procedure Laterality Date  . INCISION AND DRAINAGE     x3 pilonidal cyst    Home Medications: Prior to Admission medications   Medication Sig Start Date End Date Taking? Authorizing Provider  acetaminophen (TYLENOL) 650 MG CR tablet Take by mouth.   Yes [provider]  cephALEXin (KEFLEX) 500 MG capsule Take 1 capsule (500 mg total) by mouth 4 (four) times daily for 10 days. 01/19/20 01/29/20 Yes Laban Emperor, PA-C  cyclobenzaprine (FLEXERIL) 10 MG tablet Take 0.5-1 tablets (5-10 mg total) by mouth at bedtime. Patient taking differently: Take 5-10 mg by mouth 3 (three) times daily as needed.  03/14/19  Yes Bedsole, Amy E, MD  sulfamethoxazole-trimethoprim (BACTRIM DS) 800-160 MG tablet Take 1 tablet by mouth 2 (two) times daily. 01/19/20  Yes Laban Emperor, PA-C    Allergies: No Known Allergies  Review of Systems: Review of Systems   Constitutional: Negative for chills and fever.  Respiratory: Negative for shortness of breath.   Cardiovascular: Negative for chest pain.  Gastrointestinal: Negative for abdominal pain, nausea and vomiting.  Skin:       New pilonidal abscess, s/p I&D    Physical Exam BP (!) 147/99   Pulse 92   Temp (!) 97.5 F (36.4 C) (Temporal)   Resp 12   Ht 5\' 7"  (1.702 m)   Wt (!) 303 lb 6.4 oz (137.6 kg)   SpO2 96%   BMI 47.52 kg/m  CONSTITUTIONAL: No acute distress HEENT:  Normocephalic, atraumatic, extraocular motion intact. RESPIRATORY:  Lungs are clear, and breath sounds are equal bilaterally. Normal respiratory effort without pathologic use of accessory muscles. CARDIOVASCULAR: Heart is regular without murmurs, gallops, or rubs. GI: The abdomen is soft, non-distended, non-tender.  SKIN:  Patient has an area of palpable firmness, likely inflammation and scarring over the upper medial right buttocks, just lateral to the cleft.  She has a small 5 mm incision from her I&D site, with 1/4 inch packing.  This was removed and the wound was probed with Qtip.  No more purulent fluid was expressed.  The cavity was packed again with 1/4 inch gauze and dressed with dry gauze and tape. NEUROLOGIC:  Motor and sensation is grossly normal.  Cranial nerves are grossly intact. PSYCH:  Alert and oriented to person, place and time. Affect is normal.  Labs/Imaging:  CT scan abd/pelvis 1/22: FINDINGS: Lower chest: The visualized lung bases are clear.  No intra-abdominal free air or free fluid.  Hepatobiliary: Diffuse fatty infiltration of the liver. No intrahepatic biliary ductal dilatation. The gallbladder is unremarkable.  Pancreas: Unremarkable. No pancreatic ductal dilatation or surrounding inflammatory changes.  Spleen: Normal in size without focal abnormality.  Adrenals/Urinary Tract: The adrenal glands are unremarkable. There is no hydronephrosis on either side. Subcentimeter left renal  hypodense lesions too small to characterize. The visualized ureters appear unremarkable. The urinary bladder is minimally distended. There is apparent diffuse thickening of the bladder wall which may be partly related to underdistention. Cystitis is not excluded. Correlation with urinalysis recommended.  Stomach/Bowel: Small scattered colonic diverticula without active inflammatory changes. There is no bowel obstruction or active inflammation. The appendix is normal.  Vascular/Lymphatic: The abdominal aorta and IVC are unremarkable. No portal venous gas. There is no adenopathy.  Reproductive: The uterus is anteverted and grossly unremarkable. No adnexal masses.  Other: There is a 6.4 x 3.5 by 5.0 cm collection with stranding of the adjacent fat and a small pocket of air in the subcutaneous soft tissues of the posterior sacral region immediately above the intergluteal cleft most consistent with an infected collection or a pilonidal abscess. Clinical correlation is recommended.  Musculoskeletal: No acute or significant osseous findings.  IMPRESSION: Infected collection/pilonidal abscess in the subcutaneous soft tissues of the posterior sacrum along the superior aspect of the intergluteal cleft. Clinical correlation is recommended.   Assessment and Plan: This is a 29 y.o. female with recurrent pilonidal cyst with abscess.  Discussed with the patient that at this point, given her multiple recurrences, and failure to resolve after my last I&D with penrose drain placement, would recommend a formal I&D in the office, with excision of the pilonidal cyst and abscess.  Discussed with her that more likely we would have to leave the wound open to heal under secondary intention given the recurrences, and I think the wound is more likely to splay open despite of suture layers.  Discussed with her that she would then require daily dressing changes with wet to dry gauze.  Discussed the surgery at  length, including risks of bleeding, infection, and injury to surrounding structures.  Discussed that we would inject Exparel to help with pain control and give her prescriptions for pain medications for home.  She likely would need a week off work but could return afterwards depending on her pain control and we can provide her with a work note for that.  Given the current COVID restrictions, she would be scheduled for 02/16/20, which would give time for this new episode to heal.  She understands that she would need to get tested for COVID prior to surgery.  All of her questions have been answered.  Face-to-face time spent with the patient and care providers was 25 minutes, with more than 50% of the time spent counseling, educating, and coordinating care of the patient.     Melvyn Neth, [facility]

## 2020-01-22 NOTE — Patient Instructions (Addendum)
Dr. Hampton Abbot unpacked and packed the affected area. Please continue with changing the packing once a day at home so the gauze can absorb the drainage and the area can start to heal. Finish the course of both antibiotics.  In the meantime we will try to get your surgery scheduled for 02/16/20. Our surgery scheduler will contact you to confirm your surgery date. Please have the [facility] Sheet available when she calls you.  Due to Covid restrictions please keep in mind this date may change.  Pilonidal Cyst Drainage, Care After This sheet gives you information about how to care for yourself after your procedure. Your health care provider may also give you more specific instructions. If you have problems or questions, contact your health care provider. What can I expect after the procedure? After the procedure, it is common to have:  Pain that gets better when you take medicine.  Some fluid or blood coming from your wound. Follow these instructions at home: Medicines  Take over-the-counter and prescription medicines only as told by your health care provider.  If you were prescribed an antibiotic medicine, take it as told by your health care provider. Do not stop taking the antibiotic even if you start to feel better. Lifestyle  Do not do activities that irritate or put pressure on your buttocks for about 2 weeks, or as long as told by your health care provider. These activities include bike riding, running, and anything that involves a twisting motion.  Do not sit for long periods at a time without getting up to move around.  Sleep on your side instead of your back.  Avoid wearing tight underwear and tight pants. Bathing  Do not take baths or showers, swim, or use a hot tub until your health care provider approves. This depends on the type of wound you have from surgery.  While bathing, clean your buttocks area gently with soap and water.  After bathing: ? Pat the area dry with a soft,  clean towel. ? Cover the area with a clean bandage (dressing), if told to by your health care provider. General instructions   If you are taking prescription pain medicine, take actions to prevent or treat constipation. Your health care provider may recommend that you: ? Drink enough fluid to keep your urine pale yellow. ? Eat foods that are high in fiber, such as fresh fruits and vegetables, whole grains, and beans. ? Limit foods that are high in fat and processed sugars, such as fried or sweet foods. ? Take an over-the-counter or prescription medicine for constipation.  You will need to have a caregiver help you manage wound care and dressing changes. Your caregiver should: ? Wash his or her hands with soap and water before changing your dressing. If soap and water are not available, your caregiver should use hand sanitizer. ? Check your wound every day for signs of infection, such as:  Redness, swelling, or more pain.  More fluid or blood.  Warmth.  Pus or a bad smell. ? Follow any additional instructions from your health care provider on how to care for your wound, such as wound cleaning, wound flushing (irrigation), or packing your wound with a dressing.  Keep all follow-up visits as told by your health care provider. This is important. If you had incision and drainage with wound packing:  Return to your health care provider as instructed to have your packing material changed or removed.  Keep the area dry until your packing has been removed.  After the packing has been removed, you may start taking showers. If you had marsupialization:  You may start taking showers the day after surgery, or when your health care provider approves.  Remove your dressing before you shower, but let the water from the shower moisten your dressing before you remove it. This will make it easier to remove.  Ask your health care provider when you can stop using a dressing. If you had incision and  drainage without wound packing:  Change your dressing as directed.  Leave stitches (sutures), skin glue, or adhesive strips in place. These skin closures may need to stay in place for 2 weeks or longer. If adhesive strip edges start to loosen and curl up, you may trim the loose edges. Do not remove adhesive strips completely unless your health care provider tells you to do that. Contact a health care provider if:  You have redness, swelling, or more pain around your wound.  You have more fluid or blood coming from your wound.  You have new bleeding from your wound.  Your wound feels warm to the touch.  There is pus or a bad smell coming from your wound.  You have pain that does not get better with medicine.  You have a fever or chills.  You have muscle aches.  You are dizzy.  You feel generally sick. Summary  After a procedure to drain a pilonidal cyst, it is common to have some fluid or blood coming from your wound.  If you were prescribed an antibiotic medicine, take it as told by your health care provider. Do not stop taking the antibiotic even if you start to feel better.  Return to your health care provider as instructed to have any packing material changed or removed. This information is not intended to replace advice given to you by your health care provider. Make sure you discuss any questions you have with your health care provider. Document Revised: 10/06/2018 Document Reviewed: 12/06/2017 Elsevier Patient Education  2020 [employer].

## 2020-01-23 ENCOUNTER — Telehealth: Payer: Self-pay | Admitting: Surgery

## 2020-01-23 NOTE — Telephone Encounter (Signed)
Pt has been advised of pre admission date/time, Covid Testing date and Surgery date, however she was unable to take note of the information provided & indicated she will be calling back to confirm.  Surgery Date: 02/16/20 Preadmission Testing Date: 02/08/20 Covid Testing Date: 02/14/20 - patient advised to go to the [facility] ([place])  Patient has been made aware to call [phone], between 1-3:00pm the day before surgery, to find out what time to arrive.

## 2020-01-24 ENCOUNTER — Ambulatory Visit: Payer: 59

## 2020-01-24 ENCOUNTER — Other Ambulatory Visit: Payer: Self-pay

## 2020-01-24 DIAGNOSIS — L0591 Pilonidal cyst without abscess: Secondary | ICD-10-CM

## 2020-01-24 NOTE — Patient Instructions (Signed)
Patient came in today for a wound check.  The wound is clean, with no signs of infection noted. Packing removed and new 1/4 inch packing inserted with a top layer dressing. Patient will return to the clinic tomorrow for the same.

## 2020-01-25 ENCOUNTER — Ambulatory Visit: Payer: 59 | Admitting: Physician Assistant

## 2020-01-25 ENCOUNTER — Ambulatory Visit (INDEPENDENT_AMBULATORY_CARE_PROVIDER_SITE_OTHER): Payer: Self-pay

## 2020-01-25 ENCOUNTER — Other Ambulatory Visit: Payer: Self-pay

## 2020-01-25 DIAGNOSIS — L0591 Pilonidal cyst without abscess: Secondary | ICD-10-CM

## 2020-01-25 NOTE — Patient Instructions (Signed)
Wound Care, Adult Taking care of your wound properly can help to prevent pain, infection, and scarring. It can also help your wound to heal more quickly. How to care for your wound Wound care      Follow instructions from your health care provider about how to take care of your wound. Make sure you: ? Wash your hands with soap and water before you change the bandage (dressing). If soap and water are not available, use hand sanitizer. ? Change your dressing as told by your health care provider. ? Leave stitches (sutures), skin glue, or adhesive strips in place. These skin closures may need to stay in place for 2 weeks or longer. If adhesive strip edges start to loosen and curl up, you may trim the loose edges. Do not remove adhesive strips completely unless your health care provider tells you to do that.  Check your wound area every day for signs of infection. Check for: ? Redness, swelling, or pain. ? Fluid or blood. ? Warmth. ? Pus or a bad smell.  Ask your health care provider if you should clean the wound with mild soap and water. Doing this may include: ? Using a clean towel to pat the wound dry after cleaning it. Do not rub or scrub the wound. ? Applying a cream or ointment. Do this only as told by your health care provider. ? Covering the incision with a clean dressing.  Ask your health care provider when you can leave the wound uncovered.  Keep the dressing dry until your health care provider says it can be removed. Do not take baths, swim, use a hot tub, or do anything that would put the wound underwater until your health care provider approves. Ask your health care provider if you can take showers. You may only be allowed to take sponge baths. Medicines   If you were prescribed an antibiotic medicine, cream, or ointment, take or use the antibiotic as told by your health care provider. Do not stop taking or using the antibiotic even if your condition improves.  Take  over-the-counter and prescription medicines only as told by your health care provider. If you were prescribed pain medicine, take it 30 or more minutes before you do any wound care or as told by your health care provider. General instructions  Return to your normal activities as told by your health care provider. Ask your health care provider what activities are safe.  Do not scratch or pick at the wound.  Do not use any products that contain nicotine or tobacco, such as cigarettes and e-cigarettes. These may delay wound healing. If you need help quitting, ask your health care provider.  Keep all follow-up visits as told by your health care provider. This is important.  Eat a diet that includes protein, vitamin A, vitamin C, and other nutrient-rich foods to help the wound heal. ? Foods rich in protein include meat, dairy, beans, nuts, and other sources. ? Foods rich in vitamin A include carrots and dark green, leafy vegetables. ? Foods rich in vitamin C include citrus, tomatoes, and other fruits and vegetables. ? Nutrient-rich foods have protein, carbohydrates, fat, vitamins, or minerals. Eat a variety of healthy foods including vegetables, fruits, and whole grains. Contact a health care provider if:  You received a tetanus shot and you have swelling, severe pain, redness, or bleeding at the injection site.  Your pain is not controlled with medicine.  You have redness, swelling, or pain around the wound.    You have fluid or blood coming from the wound.  Your wound feels warm to the touch.  You have pus or a bad smell coming from the wound.  You have a fever or chills.  You are nauseous or you vomit.  You are dizzy. Get help right away if:  You have a red streak going away from your wound.  The edges of the wound open up and separate.  Your wound is bleeding, and the bleeding does not stop with gentle pressure.  You have a rash.  You faint.  You have trouble  breathing. Summary  Always wash your hands with soap and water before changing your bandage (dressing).  To help with healing, eat foods that are rich in protein, vitamin A, vitamin C, and other nutrients.  Check your wound every day for signs of infection. Contact your health care provider if you suspect that your wound is infected. This information is not intended to replace advice given to you by your health care provider. Make sure you discuss any questions you have with your health care provider. Document Revised: 04/03/2019 Document Reviewed: 06/30/2016 Elsevier Patient Education  2020 [employer] Inc[employer]  

## 2020-01-25 NOTE — Progress Notes (Signed)
Patient was here today for wound check and packing change. Patient has a Nurse Visit scheduled for tomorrow 01/26/20.

## 2020-01-26 ENCOUNTER — Ambulatory Visit (INDEPENDENT_AMBULATORY_CARE_PROVIDER_SITE_OTHER): Payer: Self-pay

## 2020-01-26 ENCOUNTER — Other Ambulatory Visit: Payer: Self-pay

## 2020-01-26 DIAGNOSIS — L0591 Pilonidal cyst without abscess: Secondary | ICD-10-CM

## 2020-01-26 NOTE — Progress Notes (Signed)
Patient came in today for a wound check.  The wound is clean, with no signs of infection noted. Packing removed. New 1/4 inch plan packing inserted, approximately 15 inches and dressing applied. Follow up as scheduled on Monday with Edison Simon.

## 2020-01-29 ENCOUNTER — Other Ambulatory Visit: Payer: Self-pay

## 2020-01-29 ENCOUNTER — Ambulatory Visit (INDEPENDENT_AMBULATORY_CARE_PROVIDER_SITE_OTHER): Payer: 59 | Admitting: Physician Assistant

## 2020-01-29 ENCOUNTER — Encounter: Payer: Self-pay | Admitting: Physician Assistant

## 2020-01-29 ENCOUNTER — Telehealth: Payer: Self-pay | Admitting: Surgery

## 2020-01-29 VITALS — BP 123/90 | HR 113 | Temp 97.3°F | Resp 12 | Ht 67.0 in | Wt 301.8 lb

## 2020-01-29 DIAGNOSIS — L0591 Pilonidal cyst without abscess: Secondary | ICD-10-CM | POA: Diagnosis not present

## 2020-01-29 NOTE — Progress Notes (Signed)
[facility] NOTE  01/29/2020  History of Present Illness: [name] is a 29 y.o. female with a history of recurrent pilonidal cyst with abscess and has had multiple I&Ds, last in the office with Dr Hampton Abbot on 11/25. Cx at that time grew out MRSA. She did well following this until about 10 days ago. She presented to the ED on 01/22 and had I&D of pilonidal cyst with abscess measuring 6.4 x 3.5 x 5.0 cm in size. She was started on Bactrim and Keflex. She has been following up with Dr Hampton Abbot and our RNs for wound checks. Today, she reports that she has been doing well. No issues with packing. No fever or chills. She has 1 day left of her ABx. Plans for formal evaluation and intervention in the OR on 02/19 with Dr Hampton Abbot.    Past Medical History: Past Medical History:  Diagnosis Date  . Migraine   . Pilonidal cyst   . Polycystic ovaries      Past Surgical History: Past Surgical History:  Procedure Laterality Date  . INCISION AND DRAINAGE     x3 pilonidal cyst    Home Medications: Prior to Admission medications   Medication Sig Start Date End Date Taking? Authorizing Provider  acetaminophen (TYLENOL) 650 MG CR tablet Take by mouth.   Yes [provider]  cephALEXin (KEFLEX) 500 MG capsule Take 1 capsule (500 mg total) by mouth 4 (four) times daily for 10 days. 01/19/20 01/29/20 Yes Laban Emperor, PA-C  cyclobenzaprine (FLEXERIL) 10 MG tablet Take 0.5-1 tablets (5-10 mg total) by mouth at bedtime. Patient taking differently: Take 5-10 mg by mouth 3 (three) times daily as needed.  03/14/19  Yes Bedsole, Amy E, MD  sulfamethoxazole-trimethoprim (BACTRIM DS) 800-160 MG tablet Take 1 tablet by mouth 2 (two) times daily. 01/19/20  Yes Laban Emperor, PA-C    Allergies: No Known Allergies  Review of Systems: Review of Systems  Constitutional: Negative for chills and fever.  Respiratory: Negative for cough and shortness of breath.     Cardiovascular: Negative for chest pain and palpitations.  Gastrointestinal: Negative for diarrhea, nausea and vomiting.  Skin: Negative for itching and rash.       + Pilonidal Cyst s/p I&D  All other systems reviewed and are negative.   Physical Exam BP 123/90   Pulse (!) 113   Temp (!) 97.3 F (36.3 C) (Temporal)   Resp 12   Ht 5\' 7"  (1.702 m)   Wt (!) 301 lb 12.8 oz (136.9 kg)   SpO2 95%   BMI 47.27 kg/m   Physical Exam Vitals and nursing note reviewed.  Constitutional:      General: She is not in acute distress.    Appearance: Normal appearance. She is obese. She is not ill-appearing.  HENT:     Head:     Comments: Wearing mask  Eyes:     General: No scleral icterus.    Conjunctiva/sclera: Conjunctivae normal.  Pulmonary:     Effort: Pulmonary effort is normal. No respiratory distress.  Genitourinary:    Comments: Deferred Skin:    General: Skin is warm and dry.       Neurological:     General: No focal deficit present.     Mental Status: She is alert and oriented to person, place, and time.  Psychiatric:        Mood and Affect: Mood normal.     Labs/Imaging: No new pertinent labs or imaging  Assessment and Plan: This is a 29 y.o. female with a history of recurrent pilonidal cysts with abscess s/p last I&D in the ED on 01/22.    - Complete ABx   - Continue packing and local wound care  - Will proceed with surgical intervention as tentatively scheduled for 02/19 with Dr Hampton Abbot, she understands all her pre-op instructions  - In the interim, she will rtc prn.   Face-to-face time spent with the patient and care providers was 10 minutes, with more than 50% of the time spent counseling, educating, and coordinating care of the patient.    -- Edison Simon, PA-C  [facility] 01/29/2020, 9:56 AM [phone] M-F: 7am - 4pm

## 2020-01-29 NOTE — Patient Instructions (Addendum)
We removed your old packing from the wound area. We have placed new packing and a new dry gauze over the area. Please continue to wash the area with soap and water.   Please finish the antibiotics tonight.   Please call the office if you have any questions or concerns.

## 2020-01-29 NOTE — Telephone Encounter (Signed)
Pt has been advised of changes made to pre admission date/time, Covid Testing date and Surgery date.  Surgery Date: 02/14/20 Preadmission Testing Date: 02/08/20 (1p-5p) Covid Testing Date: 02/12/20 - patient advised to go to the [facility] ([place])  Patient has been made aware to call [phone], between 1-3:00pm the day before surgery, to find out what time to arrive.

## 2020-01-30 ENCOUNTER — Other Ambulatory Visit: Payer: Self-pay

## 2020-01-30 ENCOUNTER — Ambulatory Visit (INDEPENDENT_AMBULATORY_CARE_PROVIDER_SITE_OTHER): Payer: Self-pay

## 2020-01-30 ENCOUNTER — Ambulatory Visit: Payer: Self-pay

## 2020-01-30 DIAGNOSIS — L0591 Pilonidal cyst without abscess: Secondary | ICD-10-CM

## 2020-01-30 NOTE — Patient Instructions (Addendum)
Patient came in today for a wound check. The wound is clean, with no signs of infection noted. Old packing removed and 8 inches of 1/4 inch plane packing inserted, gauze dressing applied. Follow up as scheduled tomorrow.

## 2020-01-31 ENCOUNTER — Ambulatory Visit (INDEPENDENT_AMBULATORY_CARE_PROVIDER_SITE_OTHER): Payer: 59

## 2020-01-31 ENCOUNTER — Other Ambulatory Visit: Payer: Self-pay

## 2020-01-31 DIAGNOSIS — L0591 Pilonidal cyst without abscess: Secondary | ICD-10-CM

## 2020-01-31 NOTE — Progress Notes (Signed)
Patient came in today for a wound check.  The wound is clean, with no signs of infection noted. Packing removed, drainage that is red/yellow in color observed. Placed 6 inches of 1/4 inch packing and gauze dressing applied. Follow up as scheduled.

## 2020-02-01 ENCOUNTER — Ambulatory Visit (INDEPENDENT_AMBULATORY_CARE_PROVIDER_SITE_OTHER): Payer: Self-pay

## 2020-02-01 ENCOUNTER — Other Ambulatory Visit: Payer: Self-pay

## 2020-02-01 DIAGNOSIS — L0591 Pilonidal cyst without abscess: Secondary | ICD-10-CM

## 2020-02-01 NOTE — Progress Notes (Signed)
Patient came in today for a wound check.  The wound is clean, with no signs of infection noted. 5 1/2 inches of 1/4 packing placed and gauze cover. Follow up as scheduled tomorrow.

## 2020-02-02 ENCOUNTER — Ambulatory Visit (INDEPENDENT_AMBULATORY_CARE_PROVIDER_SITE_OTHER): Payer: Self-pay

## 2020-02-02 ENCOUNTER — Other Ambulatory Visit: Payer: Self-pay

## 2020-02-02 DIAGNOSIS — L0591 Pilonidal cyst without abscess: Secondary | ICD-10-CM

## 2020-02-02 NOTE — Patient Instructions (Signed)
Pilonidal Cyst Drainage, Care After This sheet gives you information about how to care for yourself after your procedure. Your health care provider may also give you more specific instructions. If you have problems or questions, contact your health care provider. What can I expect after the procedure? After the procedure, it is common to have:  Pain that gets better when you take medicine.  Some fluid or blood coming from your wound. Follow these instructions at home: Medicines  Take over-the-counter and prescription medicines only as told by your health care provider.  If you were prescribed an antibiotic medicine, take it as told by your health care provider. Do not stop taking the antibiotic even if you start to feel better. Lifestyle  Do not do activities that irritate or put pressure on your buttocks for about 2 weeks, or as long as told by your health care provider. These activities include bike riding, running, and anything that involves a twisting motion.  Do not sit for long periods at a time without getting up to move around.  Sleep on your side instead of your back.  Avoid wearing tight underwear and tight pants. Bathing  Do not take baths or showers, swim, or use a hot tub until your health care provider approves. This depends on the type of wound you have from surgery.  While bathing, clean your buttocks area gently with soap and water.  After bathing: ? Pat the area dry with a soft, clean towel. ? Cover the area with a clean bandage (dressing), if told to by your health care provider. General instructions   If you are taking prescription pain medicine, take actions to prevent or treat constipation. Your health care provider may recommend that you: ? Drink enough fluid to keep your urine pale yellow. ? Eat foods that are high in fiber, such as fresh fruits and vegetables, whole grains, and beans. ? Limit foods that are high in fat and processed sugars, such as fried  or sweet foods. ? Take an over-the-counter or prescription medicine for constipation.  You will need to have a caregiver help you manage wound care and dressing changes. Your caregiver should: ? Wash his or her hands with soap and water before changing your dressing. If soap and water are not available, your caregiver should use hand sanitizer. ? Check your wound every day for signs of infection, such as:  Redness, swelling, or more pain.  More fluid or blood.  Warmth.  Pus or a bad smell. ? Follow any additional instructions from your health care provider on how to care for your wound, such as wound cleaning, wound flushing (irrigation), or packing your wound with a dressing.  Keep all follow-up visits as told by your health care provider. This is important. If you had incision and drainage with wound packing:  Return to your health care provider as instructed to have your packing material changed or removed.  Keep the area dry until your packing has been removed.  After the packing has been removed, you may start taking showers. If you had marsupialization:  You may start taking showers the day after surgery, or when your health care provider approves.  Remove your dressing before you shower, but let the water from the shower moisten your dressing before you remove it. This will make it easier to remove.  Ask your health care provider when you can stop using a dressing. If you had incision and drainage without wound packing:  Change your dressing as  directed.  Leave stitches (sutures), skin glue, or adhesive strips in place. These skin closures may need to stay in place for 2 weeks or longer. If adhesive strip edges start to loosen and curl up, you may trim the loose edges. Do not remove adhesive strips completely unless your health care provider tells you to do that. Contact a health care provider if:  You have redness, swelling, or more pain around your wound.  You have  more fluid or blood coming from your wound.  You have new bleeding from your wound.  Your wound feels warm to the touch.  There is pus or a bad smell coming from your wound.  You have pain that does not get better with medicine.  You have a fever or chills.  You have muscle aches.  You are dizzy.  You feel generally sick. Summary  After a procedure to drain a pilonidal cyst, it is common to have some fluid or blood coming from your wound.  If you were prescribed an antibiotic medicine, take it as told by your health care provider. Do not stop taking the antibiotic even if you start to feel better.  Return to your health care provider as instructed to have any packing material changed or removed. This information is not intended to replace advice given to you by your health care provider. Make sure you discuss any questions you have with your health care provider. Document Revised: 10/06/2018 Document Reviewed: 12/06/2017 Elsevier Patient Education  2020 [employer].

## 2020-02-02 NOTE — Progress Notes (Signed)
Patient came in today for a wound check.  The wound is clean, with no signs of infection noted. Patient is scheduled to come in for wound check Monday 02/05/20 at 2pm.

## 2020-02-05 ENCOUNTER — Ambulatory Visit (INDEPENDENT_AMBULATORY_CARE_PROVIDER_SITE_OTHER): Payer: Self-pay

## 2020-02-05 ENCOUNTER — Other Ambulatory Visit: Payer: Self-pay

## 2020-02-05 DIAGNOSIS — L0591 Pilonidal cyst without abscess: Secondary | ICD-10-CM

## 2020-02-05 NOTE — Patient Instructions (Signed)
We removed the old packing and gauze from wound. Please continue to do your daily packing to area.

## 2020-02-06 ENCOUNTER — Other Ambulatory Visit: Payer: Self-pay

## 2020-02-06 ENCOUNTER — Ambulatory Visit (INDEPENDENT_AMBULATORY_CARE_PROVIDER_SITE_OTHER): Payer: Self-pay

## 2020-02-06 DIAGNOSIS — L0591 Pilonidal cyst without abscess: Secondary | ICD-10-CM

## 2020-02-06 NOTE — Patient Instructions (Signed)
We removed your old packing today and placed new packing.   We will see you back as scheduled below.   If you have any questions or concerns prior to your appointment, please call our office and speak with a nurse.

## 2020-02-07 ENCOUNTER — Other Ambulatory Visit: Payer: Self-pay

## 2020-02-07 ENCOUNTER — Ambulatory Visit (INDEPENDENT_AMBULATORY_CARE_PROVIDER_SITE_OTHER): Payer: Self-pay

## 2020-02-07 DIAGNOSIS — L0591 Pilonidal cyst without abscess: Secondary | ICD-10-CM

## 2020-02-07 NOTE — Progress Notes (Signed)
Patient came in today for a wound check.  The wound is clean, with no signs of infection noted. Packing removed and 6 inches of 1/4 inch plane packing place with gauze dressing. Follow up as scheduled.

## 2020-02-08 ENCOUNTER — Ambulatory Visit (INDEPENDENT_AMBULATORY_CARE_PROVIDER_SITE_OTHER): Payer: Self-pay

## 2020-02-08 ENCOUNTER — Other Ambulatory Visit: Payer: Self-pay

## 2020-02-08 ENCOUNTER — Encounter
Admission: RE | Admit: 2020-02-08 | Discharge: 2020-02-08 | Disposition: A | Discharge status: Home / Self Care | Payer: 59 | Source: Ambulatory Visit | Attending: Surgery | Admitting: Surgery

## 2020-02-08 DIAGNOSIS — L0591 Pilonidal cyst without abscess: Secondary | ICD-10-CM

## 2020-02-08 HISTORY — DX: Depression, unspecified: F32.A

## 2020-02-08 HISTORY — DX: Personal history of Methicillin resistant Staphylococcus aureus infection: Z86.14

## 2020-02-08 HISTORY — DX: Anxiety disorder, unspecified: F41.9

## 2020-02-08 NOTE — Progress Notes (Signed)
Patient came in today for a wound check.  The wound is clean, with no signs of infection noted. Packing removed. 5 inches of 1/4 inch plane packing inserted with gauze dressing. Follow up as scheduled.

## 2020-02-08 NOTE — Patient Instructions (Signed)
Your procedure is scheduled on: 02/14/20 Report to [place]. To find out your arrival time please call [phone] between 1PM - 3PM on 02/13/20.  Remember: Instructions that are not followed completely may result in serious medical risk, up to and including death, or upon the discretion of your surgeon and anesthesiologist your surgery may need to be rescheduled.     _X__ 1. Do not eat food after midnight the night before your procedure.                 No gum chewing or hard candies. You may drink clear liquids up to 2 hours                 before you are scheduled to arrive for your surgery- DO not drink clear                 liquids within 2 hours of the start of your surgery.                 Clear Liquids include:  water, apple juice without pulp, clear carbohydrate                 drink such as Clearfast or Gatorade, Black Coffee or Tea (Do not add                 anything to coffee or tea). Diabetics water only  __X__2.  On the morning of surgery brush your teeth with toothpaste and water, you                 may rinse your mouth with mouthwash if you wish.  Do not swallow any              toothpaste of mouthwash.     _X__ 3.  No Alcohol for 24 hours before or after surgery.   _X__ 4.  Do Not Smoke or use e-cigarettes For 24 Hours Prior to Your Surgery.                 Do not use any chewable tobacco products for at least 6 hours prior to                 surgery.  ____  5.  Bring all medications with you on the day of surgery if instructed.   __X__  6.  Notify your doctor if there is any change in your medical condition      (cold, fever, infections).     Do not wear jewelry, make-up, hairpins, clips or nail polish. Do not wear lotions, powders, or perfumes.  Do not shave 48 hours prior to surgery. Men may shave face and neck. Do not bring valuables to the hospital.    [facility] is not responsible for any belongings or  valuables.  Contacts, dentures/partials or body piercings may not be worn into surgery. Bring a case for your contacts, glasses or hearing aids, a denture cup will be supplied. Leave your suitcase in the car. After surgery it may be brought to your room. For patients admitted to the hospital, discharge time is determined by your treatment team.   Patients discharged the day of surgery will not be allowed to drive home.   Please read over the following fact sheets that you were given:   MRSA Information  __X__ Take these medicines the morning of surgery with A SIP OF WATER:  1. NONE  2.   3.   4.  5.  6.  ____ Fleet Enema (as directed)   __X__ Use CHG Soap/SAGE wipes as directed  ____ Use inhalers on the day of surgery  ____ Stop metformin/Janumet/Farxiga 2 days prior to surgery    ____ Take 1/2 of usual insulin dose the night before surgery. No insulin the morning          of surgery.   ____ Stop Blood Thinners Coumadin/Plavix/Xarelto/Pleta/Pradaxa/Eliquis/Effient/Aspirin  on   Or contact your Surgeon, Cardiologist or Medical Doctor regarding  ability to stop your blood thinners  __X__ Stop Anti-inflammatories 7 days before surgery such as Advil, Ibuprofen, Motrin,  BC or Goodies Powder, Naprosyn, Naproxen, Aleve, Aspirin    __X__ Stop all herbal supplements, fish oil or vitamin E until after surgery.    ____ Bring C-Pap to the hospital.

## 2020-02-09 ENCOUNTER — Ambulatory Visit (INDEPENDENT_AMBULATORY_CARE_PROVIDER_SITE_OTHER): Payer: Self-pay

## 2020-02-09 DIAGNOSIS — L0591 Pilonidal cyst without abscess: Secondary | ICD-10-CM

## 2020-02-09 NOTE — Progress Notes (Signed)
Patient came in today for a wound check.  The wound is clean, with no signs of infection noted. Old packing removed.4 inches of 1/4 inch plain packing inserted and gauze dressing cover. Follow up as scheduled.

## 2020-02-12 ENCOUNTER — Encounter: Payer: Self-pay | Admitting: Surgery

## 2020-02-12 ENCOUNTER — Ambulatory Visit (INDEPENDENT_AMBULATORY_CARE_PROVIDER_SITE_OTHER): Payer: 59 | Admitting: Surgery

## 2020-02-12 ENCOUNTER — Other Ambulatory Visit
Admission: RE | Admit: 2020-02-12 | Discharge: 2020-02-12 | Disposition: A | Discharge status: Home / Self Care | Payer: 59 | Source: Ambulatory Visit | Attending: Surgery | Admitting: Surgery

## 2020-02-12 ENCOUNTER — Telehealth: Payer: Self-pay | Admitting: Emergency Medicine

## 2020-02-12 ENCOUNTER — Other Ambulatory Visit: Payer: Self-pay

## 2020-02-12 DIAGNOSIS — Z01812 Encounter for preprocedural laboratory examination: Secondary | ICD-10-CM | POA: Insufficient documentation

## 2020-02-12 DIAGNOSIS — Z20822 Contact with and (suspected) exposure to covid-19: Secondary | ICD-10-CM | POA: Diagnosis not present

## 2020-02-12 DIAGNOSIS — L0591 Pilonidal cyst without abscess: Secondary | ICD-10-CM

## 2020-02-12 NOTE — Telephone Encounter (Signed)
Called pt and discuss with her in regards to the below. Patient states her step mom is able to do wet to dry dressing changes after her sx.

## 2020-02-12 NOTE — Telephone Encounter (Signed)
-----   Message from Olean Ree, MD sent at 02/09/2020  4:18 PM EST ----- Regarding: surgery plans Hi,  I saw that this patient has been coming to the office every day for wound check and dressing change.  Sorry, I was not aware that she could not do the dressing changes at home.  My plans for her surgery on 2/17 would be to leave the wound open, which would require wet to dry gauze dressing changes.  Would she be able to do those at home?  Otherwise, I am wondering, is it possible to organize home health for her, or to set up a home wound vac for her like Dr. Rosana Hoes used to do?  If doing this requires delaying her surgery, I think it would be worth delaying, to make sure she has the appropriate post-operative care.  Thanks,  [employer]

## 2020-02-12 NOTE — Progress Notes (Signed)
02/12/2020  HPI: [name] is a 29 y.o. female s/p I&D of pilonidal abscess on 1/22 in the ED for recurrent pilonidal abscess.  She has been getting dressing changes with 1/4 inch gauze packing daily in the office as nurse visits.  She's scheduled for surgery on 2/17 for formal pilonidal cyst excision in the OR.  She presents today for dressing change, and was called to her room because of difficulty placing the 1/4 inch packing into the wound.  Vital signs: There were no vitals taken for this visit.   Physical Exam: Constitutional: No acute distress Skin:  There is still some firmness to the surround tissue around the incision in the upper gluteal cleft.  The cavity is improving in size, but has not fully healed.  There is still some purulent drainage, but there is no significant surrounding erythema or induration.  Wound was dressed again with 1/4 inch gauze, dry 4x4 gauze, and tape.  Assessment/Plan: This is a 29 y.o. female s/p I&D of pilonidal cyst abscess.  --Discussed with the patient that we still plan to proceed with pilonidal cyst excision on 2/17.  Again discussed with her the plan for leaving the wound open.  We can either do wet to dry dressing changes, or wound vac dressing changes.  Our office will try to set up home health if her insurance allows and explore the option of wound vac for it.  Otherwise, we would do wet to dry at home. --All questions were answered.   Melvyn Neth, [facility]

## 2020-02-13 ENCOUNTER — Other Ambulatory Visit: Payer: Self-pay

## 2020-02-13 ENCOUNTER — Ambulatory Visit (INDEPENDENT_AMBULATORY_CARE_PROVIDER_SITE_OTHER): Payer: Self-pay

## 2020-02-13 DIAGNOSIS — L0591 Pilonidal cyst without abscess: Secondary | ICD-10-CM

## 2020-02-13 LAB — SARS CORONAVIRUS 2 (TAT 6-24 HRS): SARS Coronavirus 2: NEGATIVE

## 2020-02-13 MED ORDER — CLINDAMYCIN PHOSPHATE 900 MG/50ML IV SOLN
900.0000 mg | INTRAVENOUS | Status: AC
  Administered 2020-02-14: 13:00:00 900 mg via INTRAVENOUS

## 2020-02-13 NOTE — Patient Instructions (Signed)
Removed old packing from patients wound area. Wound looks clean, no redness no drainage. Placed new packing. Applied a dry clean gauze over the area.   We will see you after your surgery. Please contact the office if you have any questions or concerns.

## 2020-02-14 ENCOUNTER — Encounter: Payer: Self-pay | Admitting: Surgery

## 2020-02-14 ENCOUNTER — Ambulatory Visit: Payer: 59 | Admitting: Anesthesiology

## 2020-02-14 ENCOUNTER — Encounter
Admission: RE | Disposition: A | Discharge status: Home / Self Care | Payer: Self-pay | Source: Home / Self Care | Attending: Surgery

## 2020-02-14 ENCOUNTER — Other Ambulatory Visit: Payer: Self-pay

## 2020-02-14 ENCOUNTER — Ambulatory Visit
Admission: RE | Admit: 2020-02-14 | Discharge: 2020-02-14 | Disposition: A | Discharge status: Home / Self Care | Payer: 59 | Attending: Surgery | Admitting: Surgery

## 2020-02-14 ENCOUNTER — Other Ambulatory Visit: Payer: 59

## 2020-02-14 ENCOUNTER — Other Ambulatory Visit: Payer: Self-pay | Admitting: Surgery

## 2020-02-14 DIAGNOSIS — L0591 Pilonidal cyst without abscess: Secondary | ICD-10-CM

## 2020-02-14 DIAGNOSIS — L0501 Pilonidal cyst with abscess: Secondary | ICD-10-CM | POA: Insufficient documentation

## 2020-02-14 DIAGNOSIS — Z6841 Body Mass Index (BMI) 40.0 and over, adult: Secondary | ICD-10-CM | POA: Diagnosis not present

## 2020-02-14 HISTORY — PX: PILONIDAL CYST EXCISION: SHX744

## 2020-02-14 LAB — POCT PREGNANCY, URINE: Preg Test, Ur: NEGATIVE

## 2020-02-14 SURGERY — EXCISION, PILONIDAL CYST, EXTENSIVE
Anesthesia: General | Site: Buttocks

## 2020-02-14 MED ORDER — ONDANSETRON HCL 4 MG/2ML IJ SOLN
INTRAMUSCULAR | Status: DC | PRN
  Administered 2020-02-14: 4 mg via INTRAVENOUS

## 2020-02-14 MED ORDER — FAMOTIDINE 20 MG PO TABS
20.0000 mg | ORAL_TABLET | Freq: Once | ORAL | Status: AC
  Administered 2020-02-14: 20 mg via ORAL

## 2020-02-14 MED ORDER — SCOPOLAMINE 1 MG/3DAYS TD PT72: 1.0000 | MEDICATED_PATCH | TRANSDERMAL | Status: DC

## 2020-02-14 MED ORDER — DEXAMETHASONE SODIUM PHOSPHATE 10 MG/ML IJ SOLN
INTRAMUSCULAR | Status: DC | PRN
  Administered 2020-02-14: 10 mg via INTRAVENOUS

## 2020-02-14 MED ORDER — LIDOCAINE HCL (PF) 2 % IJ SOLN
INTRAMUSCULAR | Status: AC
  Filled 2020-02-14: qty 10

## 2020-02-14 MED ORDER — GABAPENTIN 300 MG PO CAPS
300.0000 mg | ORAL_CAPSULE | ORAL | Status: AC
  Administered 2020-02-14: 300 mg via ORAL

## 2020-02-14 MED ORDER — OXYCODONE HCL 5 MG PO TABS: 5.0000 mg | ORAL_TABLET | Freq: Once | ORAL | Status: DC | PRN

## 2020-02-14 MED ORDER — FAMOTIDINE 20 MG PO TABS
ORAL_TABLET | ORAL | Status: AC
  Filled 2020-02-14: qty 1

## 2020-02-14 MED ORDER — MIDAZOLAM HCL 2 MG/2ML IJ SOLN
INTRAMUSCULAR | Status: DC | PRN
  Administered 2020-02-14: 2 mg via INTRAVENOUS

## 2020-02-14 MED ORDER — PROPOFOL 10 MG/ML IV BOLUS
INTRAVENOUS | Status: DC | PRN
  Administered 2020-02-14: 200 mg via INTRAVENOUS

## 2020-02-14 MED ORDER — LIDOCAINE HCL (CARDIAC) PF 100 MG/5ML IV SOSY
PREFILLED_SYRINGE | INTRAVENOUS | Status: DC | PRN
  Administered 2020-02-14: 100 mg via INTRAVENOUS

## 2020-02-14 MED ORDER — KETAMINE HCL 10 MG/ML IJ SOLN
INTRAMUSCULAR | Status: DC | PRN
  Administered 2020-02-14: 50 mg via INTRAVENOUS

## 2020-02-14 MED ORDER — BUPIVACAINE LIPOSOME 1.3 % IJ SUSP
INTRAMUSCULAR | Status: DC | PRN
  Administered 2020-02-14: 20 mL

## 2020-02-14 MED ORDER — DEXMEDETOMIDINE HCL IN NACL 80 MCG/20ML IV SOLN
INTRAVENOUS | Status: AC
  Filled 2020-02-14: qty 20

## 2020-02-14 MED ORDER — LACTATED RINGERS IV SOLN: INTRAVENOUS | Status: DC | PRN

## 2020-02-14 MED ORDER — FENTANYL CITRATE (PF) 100 MCG/2ML IJ SOLN
INTRAMUSCULAR | Status: AC
  Filled 2020-02-14: qty 2

## 2020-02-14 MED ORDER — DEXMEDETOMIDINE HCL 200 MCG/2ML IV SOLN
INTRAVENOUS | Status: DC | PRN
  Administered 2020-02-14: 20 ug via INTRAVENOUS

## 2020-02-14 MED ORDER — SCOPOLAMINE 1 MG/3DAYS TD PT72
MEDICATED_PATCH | TRANSDERMAL | Status: AC
  Administered 2020-02-14: 12:00:00 1.5 mg via TRANSDERMAL
  Filled 2020-02-14: qty 1

## 2020-02-14 MED ORDER — GABAPENTIN 300 MG PO CAPS
ORAL_CAPSULE | ORAL | Status: AC
  Filled 2020-02-14: qty 1

## 2020-02-14 MED ORDER — PHENYLEPHRINE HCL (PRESSORS) 10 MG/ML IV SOLN
INTRAVENOUS | Status: DC | PRN
  Administered 2020-02-14 (×3): 100 ug via INTRAVENOUS

## 2020-02-14 MED ORDER — FENTANYL CITRATE (PF) 100 MCG/2ML IJ SOLN
INTRAMUSCULAR | Status: DC | PRN
  Administered 2020-02-14: 100 ug via INTRAVENOUS

## 2020-02-14 MED ORDER — PHENYLEPHRINE HCL (PRESSORS) 10 MG/ML IV SOLN
INTRAVENOUS | Status: AC
  Filled 2020-02-14: qty 1

## 2020-02-14 MED ORDER — BUPIVACAINE LIPOSOME 1.3 % IJ SUSP: 20.0000 mL | Freq: Once | INTRAMUSCULAR | Status: DC

## 2020-02-14 MED ORDER — ACETAMINOPHEN 500 MG PO TABS
1000.0000 mg | ORAL_TABLET | ORAL | Status: AC
  Administered 2020-02-14: 1000 mg via ORAL

## 2020-02-14 MED ORDER — ONDANSETRON HCL 4 MG/2ML IJ SOLN
INTRAMUSCULAR | Status: AC
  Filled 2020-02-14: qty 2

## 2020-02-14 MED ORDER — FENTANYL CITRATE (PF) 100 MCG/2ML IJ SOLN: 25.0000 ug | INTRAMUSCULAR | Status: DC | PRN

## 2020-02-14 MED ORDER — CHLORHEXIDINE GLUCONATE CLOTH 2 % EX PADS
6.0000 | MEDICATED_PAD | Freq: Once | CUTANEOUS | Status: AC
  Administered 2020-02-14: 6 via TOPICAL

## 2020-02-14 MED ORDER — CHLORHEXIDINE GLUCONATE CLOTH 2 % EX PADS: 6.0000 | MEDICATED_PAD | Freq: Once | CUTANEOUS | Status: DC

## 2020-02-14 MED ORDER — BUPIVACAINE LIPOSOME 1.3 % IJ SUSP
INTRAMUSCULAR | Status: AC
  Filled 2020-02-14: qty 20

## 2020-02-14 MED ORDER — KETAMINE HCL 50 MG/ML IJ SOLN
INTRAMUSCULAR | Status: AC
  Filled 2020-02-14: qty 10

## 2020-02-14 MED ORDER — OXYCODONE HCL 5 MG/5ML PO SOLN: 5.0000 mg | Freq: Once | ORAL | Status: DC | PRN

## 2020-02-14 MED ORDER — OXYCODONE HCL 5 MG PO TABS: 5.0000 mg | ORAL_TABLET | ORAL | 0 refills | Status: DC | PRN

## 2020-02-14 MED ORDER — SODIUM CHLORIDE (PF) 0.9 % IJ SOLN
INTRAMUSCULAR | Status: AC
  Filled 2020-02-14: qty 10

## 2020-02-14 MED ORDER — IBUPROFEN 800 MG PO TABS: 800.0000 mg | ORAL_TABLET | Freq: Three times a day (TID) | ORAL | 1 refills | Status: DC | PRN

## 2020-02-14 MED ORDER — ACETAMINOPHEN 500 MG PO TABS
ORAL_TABLET | ORAL | Status: AC
  Filled 2020-02-14: qty 2

## 2020-02-14 MED ORDER — CLINDAMYCIN PHOSPHATE 900 MG/50ML IV SOLN
INTRAVENOUS | Status: AC
  Filled 2020-02-14: qty 50

## 2020-02-14 MED ORDER — ROCURONIUM BROMIDE 50 MG/5ML IV SOLN
INTRAVENOUS | Status: AC
  Filled 2020-02-14: qty 1

## 2020-02-14 MED ORDER — PROPOFOL 10 MG/ML IV BOLUS
INTRAVENOUS | Status: AC
  Filled 2020-02-14: qty 20

## 2020-02-14 MED ORDER — LACTATED RINGERS IV SOLN: INTRAVENOUS | Status: DC

## 2020-02-14 MED ORDER — SUCCINYLCHOLINE CHLORIDE 20 MG/ML IJ SOLN
INTRAMUSCULAR | Status: DC | PRN
  Administered 2020-02-14: 140 mg via INTRAVENOUS

## 2020-02-14 MED ORDER — MIDAZOLAM HCL 2 MG/2ML IJ SOLN
INTRAMUSCULAR | Status: AC
  Filled 2020-02-14: qty 2

## 2020-02-14 MED ORDER — BUPIVACAINE-EPINEPHRINE 0.5% -1:200000 IJ SOLN
INTRAMUSCULAR | Status: DC | PRN
  Administered 2020-02-14: 30 mL

## 2020-02-14 SURGICAL SUPPLY — 32 items
BLADE SURG 15 STRL LF DISP TIS (BLADE) ×1 IMPLANT
BLADE SURG 15 STRL SS (BLADE) ×1
BRIEF STRETCH MATERNITY 2XLG (MISCELLANEOUS) ×2 IMPLANT
CANISTER SUCT 1200ML W/VALVE (MISCELLANEOUS) ×2 IMPLANT
COVER WAND RF STERILE (DRAPES) ×2 IMPLANT
DRAPE LAPAROTOMY 100X77 ABD (DRAPES) ×2 IMPLANT
DRSG TEGADERM 4X4.75 (GAUZE/BANDAGES/DRESSINGS) ×2 IMPLANT
DRSG TELFA 4X3 1S NADH ST (GAUZE/BANDAGES/DRESSINGS) ×2 IMPLANT
ELECT CAUTERY BLADE 6.4 (BLADE) ×2 IMPLANT
ELECT REM PT RETURN 9FT ADLT (ELECTROSURGICAL) ×2
ELECTRODE REM PT RTRN 9FT ADLT (ELECTROSURGICAL) ×1 IMPLANT
GLOVE SURG SYN 7.0 (GLOVE) ×2 IMPLANT
GLOVE SURG SYN 7.5  E (GLOVE) ×1
GLOVE SURG SYN 7.5 E (GLOVE) ×1 IMPLANT
GOWN STRL REUS W/ TWL LRG LVL3 (GOWN DISPOSABLE) ×2 IMPLANT
GOWN STRL REUS W/TWL LRG LVL3 (GOWN DISPOSABLE) ×2
KIT TURNOVER KIT A (KITS) ×2 IMPLANT
LABEL OR SOLS (LABEL) ×2 IMPLANT
NEEDLE HYPO 22GX1.5 SAFETY (NEEDLE) ×2 IMPLANT
NS IRRIG 500ML POUR BTL (IV SOLUTION) ×2 IMPLANT
PACK BASIN MINOR ARMC (MISCELLANEOUS) ×2 IMPLANT
PAD ABD DERMACEA PRESS 5X9 (GAUZE/BANDAGES/DRESSINGS) ×2 IMPLANT
SOL PREP PVP 2OZ (MISCELLANEOUS) ×2
SOLUTION PREP PVP 2OZ (MISCELLANEOUS) ×1 IMPLANT
SUT ETHILON 2 0 FS 18 (SUTURE) IMPLANT
SUT MNCRL 4-0 (SUTURE) ×1
SUT MNCRL 4-0 27XMFL (SUTURE) ×1
SUT VIC AB 3-0 SH 27 (SUTURE) ×1
SUT VIC AB 3-0 SH 27X BRD (SUTURE) ×1 IMPLANT
SUTURE MNCRL 4-0 27XMF (SUTURE) ×1 IMPLANT
SYR 10ML LL (SYRINGE) ×2 IMPLANT
SYR BULB IRRIG 60ML STRL (SYRINGE) ×2 IMPLANT

## 2020-02-14 NOTE — Anesthesia Procedure Notes (Signed)
Performed by: Lia Foyer, CRNA

## 2020-02-14 NOTE — Op Note (Signed)
  Procedure Date:  02/14/2020  Pre-operative Diagnosis:  Pilonidal Cyst with recurrent abscess  Post-operative Diagnosis:  Pilonidal cyst with recurrent abscess  Procedure:  Pilonidal cyst excision  Surgeon:  Melvyn Neth, MD  Assistant:  Burke Keels, PA-S  Anesthesia:  General endotracheal  Estimated Blood Loss:  10 ml  Specimens:  Pilonidal cyst  Complications:  None  Indications for Procedure:  This is a 29 y.o. female with pilonidal cyst with recurrent abscess.  She has had prior I&D procedures, and currently still has an open cavity and getting packing dressing changes with 1/4 iodoform.  The options of surgery versus observation were reviewed with the patient and/or family. The risks of bleeding, infection, recurrence of symptoms, abscess or infection, were all discussed with the patient and she was willing to proceed.  Description of Procedure: The patient was correctly identified in the preoperative area and brought into the operating room.  The patient was placed supine with VTE prophylaxis in place.  Appropriate time-outs were performed.  Anesthesia was induced and the patient was intubated.  The patient was then placed in prone position. Appropriate antibiotics were infused.  The pilonidal area was prepped and draped in usual sterile fashion.  A 7 cm elliptical incision was made, incorporating the pilonidal cyst and pits.  Cautery was used to dissect down the subcutaneous tissues to the cyst and the cyst with skin was excised intact using cautery.  This was sent to pathology.   The cavity was irrigated and hemostasis was assured.  Local anesthetic was infiltrated into the skin and subcutaneous tissue of the cavity.  The cavity was then packed with wet to dry 4x4 gauze, with ABD pad on top, and secured with tape.  The patient was then placed back on supine position, emerged from anesthesia, extubated, and brought to the recovery room for further management.  The patient  tolerated the procedure well and all counts were correct at the end of the case.   Melvyn Neth, MD

## 2020-02-14 NOTE — Interval H&P Note (Signed)
History and Physical Interval Note:  02/14/2020 12:11 PM  [name]  has presented today for surgery, with the diagnosis of Recurrent pilonidal cyst with abscess.  The various methods of treatment have been discussed with the patient and family. After consideration of risks, benefits and other options for treatment, the patient has consented to  Procedure(s): CYST EXCISION PILONIDAL EXTENSIVE (N/A) as a surgical intervention.  The patient's history has been reviewed, patient examined, no change in status, stable for surgery.  I have reviewed the patient's chart and labs.  Questions were answered to the patient's satisfaction.     Randel Hargens

## 2020-02-14 NOTE — Anesthesia Preprocedure Evaluation (Addendum)
Anesthesia Evaluation  Patient identified by MRN, date of birth, ID band Patient awake    Reviewed: Allergy & Precautions, H&P , NPO status , Patient's Chart, lab work & pertinent test results  Airway Mallampati: II  TM Distance: >3 FB Neck ROM: full    Dental  (+) Teeth Intact   Pulmonary neg pulmonary ROS,           Cardiovascular (-) anginanegative cardio ROS  (-) dysrhythmias      Neuro/Psych  Headaches, PSYCHIATRIC DISORDERS Anxiety Depression    GI/Hepatic negative GI ROS, Neg liver ROS,   Endo/Other  Morbid obesity  Renal/GU      Musculoskeletal   Abdominal   Peds  Hematology negative hematology ROS (+)   Anesthesia Other Findings Past Medical History: No date: Anxiety No date: Depression 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 No date: Migraine No date: Pilonidal cyst No date: Polycystic ovaries  Past Surgical History: No date: INCISION AND DRAINAGE     Comment:  x3 pilonidal cyst     Reproductive/Obstetrics negative OB ROS                             Anesthesia Physical Anesthesia Plan  ASA: III  Anesthesia Plan: General ETT   Post-op Pain Management:    Induction:   PONV Risk Score and Plan: Ondansetron, Dexamethasone, Midazolam, Treatment may vary due to age or medical condition and Scopolamine patch - Pre-op  Airway Management Planned:   Additional Equipment:   Intra-op Plan:   Post-operative Plan:   Informed Consent: I have reviewed the patients History and Physical, chart, labs and discussed the procedure including the risks, benefits and alternatives for the proposed anesthesia with the patient or authorized representative who has indicated his/her understanding and acceptance.     Dental Advisory Given  Plan Discussed with: Anesthesiologist  Anesthesia Plan Comments:        Anesthesia Quick Evaluation

## 2020-02-14 NOTE — OR Nursing (Addendum)
Per Dr. Hampton Abbot verbal, office is working on home health set up for dressing changes. Supplies gathered by Judee Clara RN and sent home with patient for mom, as requested by Dr Hampton Abbot, to do day 1 of dressing change.

## 2020-02-14 NOTE — Anesthesia Procedure Notes (Signed)
Procedure Name: Intubation Date/Time: 02/14/2020 12:35 PM Performed by: Lia Foyer, CRNA Pre-anesthesia Checklist: Patient identified, Emergency Drugs available, Suction available and Patient being monitored Patient Re-evaluated:Patient Re-evaluated prior to induction Oxygen Delivery Method: Circle system utilized Preoxygenation: Pre-oxygenation with 100% oxygen Induction Type: IV induction Ventilation: Mask ventilation with difficulty and Oral airway inserted - appropriate to patient size Laryngoscope Size: McGraph and 3 Grade View: Grade I Tube type: Oral Tube size: 7.5 mm Number of attempts: 1 Airway Equipment and Method: Stylet,  Oral airway,  Video-laryngoscopy and Patient positioned with wedge pillow Placement Confirmation: ETT inserted through vocal cords under direct vision,  positive ETCO2 and breath sounds checked- equal and bilateral Secured at: 21 cm Tube secured with: Tape Dental Injury: Teeth and Oropharynx as per pre-operative assessment

## 2020-02-14 NOTE — Transfer of Care (Signed)
Immediate Anesthesia Transfer of Care Note  Patient: [name]  Procedure(s) Performed: CYST EXCISION PILONIDAL EXTENSIVE (N/A Buttocks)  Patient Location: PACU  Anesthesia Type:General  Level of Consciousness: drowsy  Airway & Oxygen Therapy: Patient Spontanous Breathing and Patient connected to face mask oxygen  Post-op Assessment: Report given to RN and Post -op Vital signs reviewed and stable  Post vital signs: Reviewed and stable  Last Vitals:  Vitals Value Taken Time  BP 120/65 02/14/20 1353  Temp    Pulse 80 02/14/20 1354  Resp 18 02/14/20 1354  SpO2 97 % 02/14/20 1354  Vitals shown include unvalidated device data.  Last Pain:  Vitals:   02/14/20 1053  PainSc: 0-No pain         Complications: No apparent anesthesia complications

## 2020-02-14 NOTE — Discharge Instructions (Signed)

## 2020-02-15 LAB — SURGICAL PATHOLOGY

## 2020-02-15 NOTE — Anesthesia Postprocedure Evaluation (Signed)
Anesthesia Post Note  Patient: [name]  Procedure(s) Performed: CYST EXCISION PILONIDAL EXTENSIVE (N/A Buttocks)  Patient location during evaluation: PACU Anesthesia Type: General Level of consciousness: awake and alert Pain management: pain level controlled Vital Signs Assessment: post-procedure vital signs reviewed and stable Respiratory status: spontaneous breathing, nonlabored ventilation and respiratory function stable Cardiovascular status: blood pressure returned to baseline and stable Postop Assessment: no apparent nausea or vomiting Anesthetic complications: no     Last Vitals:  Vitals:   02/14/20 1618 02/14/20 1631  BP: 138/85 127/77  Pulse: 85 86  Resp: 18 16  Temp: (!) 36.1 C   SpO2: 96% 97%    Last Pain:  Vitals:   02/14/20 1631  TempSrc:   PainSc: 0-No pain                 Brett Canales Jonel Weldon

## 2020-02-16 ENCOUNTER — Telehealth: Payer: Self-pay

## 2020-02-16 NOTE — Telephone Encounter (Signed)
Kim with [facility] called and said that they cannot see the patient for home health services due to not taking her insurance.   I called and spoke with Floydene Flock with [facility] about seeing the patient. I gave him her information and he will call us back about this patient.

## 2020-02-16 NOTE — Telephone Encounter (Signed)
Jason with [facility] called back and said that they will be unable to see this patient for home care as staff is not available.  I spoke with the patient and she is alright with this. Her Step mom is doing the dressing changes. I will have her come in for a post op appointment early with Thedore Mins just so we can look at the area. She is amendable to this.

## 2020-02-20 ENCOUNTER — Encounter: Payer: Self-pay | Admitting: Physician Assistant

## 2020-02-20 ENCOUNTER — Other Ambulatory Visit: Payer: Self-pay

## 2020-02-20 ENCOUNTER — Ambulatory Visit (INDEPENDENT_AMBULATORY_CARE_PROVIDER_SITE_OTHER): Payer: Self-pay | Admitting: Physician Assistant

## 2020-02-20 VITALS — BP 140/95 | HR 94 | Temp 97.5°F | Resp 12 | Ht 67.0 in | Wt 303.0 lb

## 2020-02-20 DIAGNOSIS — Z09 Encounter for follow-up examination after completed treatment for conditions other than malignant neoplasm: Secondary | ICD-10-CM

## 2020-02-20 DIAGNOSIS — L0591 Pilonidal cyst without abscess: Secondary | ICD-10-CM

## 2020-02-20 MED ORDER — IBUPROFEN 800 MG PO TABS: 800.0000 mg | ORAL_TABLET | Freq: Three times a day (TID) | ORAL | 1 refills | Status: DC | PRN

## 2020-02-20 NOTE — Progress Notes (Signed)
[facility] POST-OP OFFICE VISIT  02/20/2020  HPI: [name] is a 29 y.o. female 6 days s/p pilonidal cyst excision for recurrent abscesses with Dr Hampton Abbot.   Overall doing well. She is mostly taking ibuprofen for pain. Today the pain has been a little worse than normal. No fever or chills at home. Her step mom has been doing dressing changes daily. Drainage has been serous.   Vital signs: BP (!) 140/95   Pulse 94   Temp (!) 97.5 F (36.4 C)   Resp 12   Ht 5\' 7"  (1.702 m)   Wt (!) 303 lb (137.4 kg)   SpO2 98%   BMI 47.46 kg/m    Physical Exam: Constitutional: Well appearing female, NAD Skin: ~7cm incision just superior to the gluteal crease, the tissue in the wound bed is pink and starting to granulate, no erythema or purulence   Assessment/Plan: This is a 29 y.o. 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

## 2020-02-20 NOTE — Patient Instructions (Addendum)
Thedore Mins will change patient's dressing at today's visit. Zach refilled patient's Ibuprofen prescription at today's visit. Patient will continue to do dressing changes daily. Patient will let our office know if she has any problems as far as pain management.  GENERAL POST-OPERATIVE PATIENT INSTRUCTIONS   FOLLOW-UP:  Please make an appointment with your physician in.  Call your physician immediately if you have any fevers greater than 102.5, drainage from you wound that is not clear or looks infected, persistent bleeding, increasing abdominal pain, problems urinating, or persistent nausea/vomiting.    WOUND CARE INSTRUCTIONS:  Keep a dry clean dressing on the wound if there is drainage. The initial bandage may be removed after 24 hours.  Once the wound has quit draining you may leave it open to air.  If clothing rubs against the wound or causes irritation and the wound is not draining you may cover it with a dry dressing during the daytime.  Try to keep the wound dry and avoid ointments on the wound unless directed to do so.  If the wound becomes bright red and painful or starts to drain infected material that is not clear, please contact your physician immediately.  If the wound is mildly pink and has a thick firm ridge underneath it, this is normal, and is referred to as a healing ridge.  This will resolve over the next 4-6 weeks.  DIET:  You may eat any foods that you can tolerate.  It is a good idea to eat a high fiber diet and take in plenty of fluids to prevent constipation.  If you do become constipated you may want to take a mild laxative or take ducolax tablets on a daily basis until your bowel habits are regular.  Constipation can be very uncomfortable, along with straining, after recent surgery.  ACTIVITY:  You are encouraged to cough and deep breath or use your incentive spirometer if you were given one, every 15-30 minutes when awake.  This will help prevent respiratory complications and low  grade fevers post-operatively if you had a general anesthetic.  You may want to hug a pillow when coughing and sneezing to add additional support to the surgical area, if you had abdominal or chest surgery, which will decrease pain during these times.  You are encouraged to walk and engage in light activity for the next two weeks.  You should not lift more than 20 pounds during this time frame as it could put you at increased risk for complications.  Twenty pounds is roughly equivalent to a plastic bag of groceries.    MEDICATIONS:  Try to take narcotic medications and anti-inflammatory medications, such as tylenol, ibuprofen, naprosyn, etc., with food.  This will minimize stomach upset from the medication.  Should you develop nausea and vomiting from the pain medication, or develop a rash, please discontinue the medication and contact your physician.  You should not drive, make important decisions, or operate machinery when taking narcotic pain medication.  QUESTIONS:  Please feel free to call your physician or the hospital operator if you have any questions, and they will be glad to assist you.

## 2020-02-23 ENCOUNTER — Other Ambulatory Visit: Payer: Self-pay

## 2020-02-23 ENCOUNTER — Ambulatory Visit (INDEPENDENT_AMBULATORY_CARE_PROVIDER_SITE_OTHER): Payer: Self-pay

## 2020-02-23 DIAGNOSIS — Z09 Encounter for follow-up examination after completed treatment for conditions other than malignant neoplasm: Secondary | ICD-10-CM

## 2020-02-23 NOTE — Progress Notes (Signed)
Packing removed. Wound looks beefy red, minimal drainage. Denies fever, chills. Stated discomfort around outer edges of wound.   Patient stated she packs the wound every other day.  Patient instructed to remove old packing wash the area with soap and water daily. Rinse well and place new packing material into wound cavity and cover with dry dressing.

## 2020-02-28 ENCOUNTER — Other Ambulatory Visit: Payer: Self-pay

## 2020-02-28 ENCOUNTER — Ambulatory Visit (INDEPENDENT_AMBULATORY_CARE_PROVIDER_SITE_OTHER): Payer: Self-pay | Admitting: Surgery

## 2020-02-28 ENCOUNTER — Encounter: Payer: Self-pay | Admitting: Surgery

## 2020-02-28 VITALS — BP 155/98 | HR 101 | Temp 97.5°F | Resp 12 | Ht 67.0 in | Wt 304.6 lb

## 2020-02-28 DIAGNOSIS — L0591 Pilonidal cyst without abscess: Secondary | ICD-10-CM

## 2020-02-28 DIAGNOSIS — Z09 Encounter for follow-up examination after completed treatment for conditions other than malignant neoplasm: Secondary | ICD-10-CM

## 2020-02-28 NOTE — Patient Instructions (Signed)
Dr Hampton Abbot removed your old dressing today and packed it with new wet gauze. He then applied a dry gauze over the area to covered it. Continue with daily dressing changes. To help improve with the healing process start doing a higher protein diet.  You will follow up with Dr Hampton Abbot in 1 month. Please see your appointment below.   If your step mom cannot do your dressing changes give our office a call you set you up as a Nurse visit.

## 2020-02-28 NOTE — Progress Notes (Signed)
02/28/2020  HPI: [name] is a 29 y.o. 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  She presents today for follow up.  She denies any significant pain and continues doing wet to dry dressing changes daily.    Vital signs: BP (!) 155/98   Pulse (!) 101   Temp (!) 97.5 F (36.4 C)   Resp 12   Ht 5\' 7"  (1.702 m)   Wt (!) 304 lb 9.6 oz (138.2 kg)   SpO2 96%   BMI 47.71 kg/m    Physical Exam: Constitutional: No acute distress Skin:  Gluteal cleft wound is healing very well, with healthy granulation tissue throughout.  There is no fibrinous tissue, no purulent drainage, no foul odor.  The wound measures about 7 cm x 5 cm x 5 cm, with some undermining on the right superior aspect.  Assessment/Plan: This is a 29 y.o. female s/p pilonidal cyst excision.  --Discussed with the patient that her wound is healing very well.  She can continue with daily wet to dry dressing changes as currently doing. --No antibiotics needed. --Can take Tylenol or Ibuprofen for pain if any. --Can return to work next week. --Follow up in a month for wound check.   Melvyn Neth, [facility]

## 2020-02-29 ENCOUNTER — Ambulatory Visit: Payer: 59 | Admitting: Family Medicine

## 2020-02-29 ENCOUNTER — Other Ambulatory Visit: Payer: Self-pay

## 2020-02-29 ENCOUNTER — Encounter: Payer: Self-pay | Admitting: Family Medicine

## 2020-02-29 VITALS — BP 122/92 | HR 96 | Temp 98.1°F | Ht 67.25 in | Wt 304.5 lb

## 2020-02-29 DIAGNOSIS — Z1329 Encounter for screening for other suspected endocrine disorder: Secondary | ICD-10-CM

## 2020-02-29 DIAGNOSIS — Z131 Encounter for screening for diabetes mellitus: Secondary | ICD-10-CM

## 2020-02-29 DIAGNOSIS — R0683 Snoring: Secondary | ICD-10-CM | POA: Insufficient documentation

## 2020-02-29 DIAGNOSIS — R030 Elevated blood-pressure reading, without diagnosis of hypertension: Secondary | ICD-10-CM | POA: Insufficient documentation

## 2020-02-29 DIAGNOSIS — F321 Major depressive disorder, single episode, moderate: Secondary | ICD-10-CM

## 2020-02-29 DIAGNOSIS — R41840 Attention and concentration deficit: Secondary | ICD-10-CM | POA: Insufficient documentation

## 2020-02-29 DIAGNOSIS — Z13220 Encounter for screening for lipoid disorders: Secondary | ICD-10-CM

## 2020-02-29 MED ORDER — SERTRALINE HCL 50 MG PO TABS: 50.0000 mg | ORAL_TABLET | Freq: Every day | ORAL | 3 refills | Status: DC

## 2020-02-29 NOTE — Patient Instructions (Addendum)
We will call with referral for sleep specialist.  Get BP cuff.. follow BP at home.. call if consistently >  140/90.  Start back sertraline 50 mg daily.

## 2020-02-29 NOTE — Assessment & Plan Note (Signed)
Encouraged exercise, weight loss, healthy eating habits. ? ?

## 2020-02-29 NOTE — Assessment & Plan Note (Signed)
Poor control. Eval TSH.  Start back on sertraline 50 mg daily.  Close follow up in 4 weeks.

## 2020-02-29 NOTE — Assessment & Plan Note (Signed)
Refer to sleep specialist as she is high risk for sleep apnea and has concerning symptoms.

## 2020-02-29 NOTE — Assessment & Plan Note (Signed)
[place] due to mood, but once mood improved consider referral for ADD testing.

## 2020-02-29 NOTE — Progress Notes (Signed)
Chief Complaint  Patient presents with  . Depression  . Anxiety  . Referral    Would like a referral for a sleep study    History of Present Illness: HPI     29 year old female  with history of MDD presents for depression and anxiety.  She reports that in last 2-3 months she has had a return of depression and some anxiety.  Anhedonia, trouble sleeping.  Some issues for focus, concentrating ( always as child had this as well)  Concerned she have ADD in addition. She was on sertraline in past.. helped some with issues.  She has also recently been treated surgically for pilonidal cyst.  Morbid obesity, concern for sleep apnea  Father sleep apnea.  Step mother.. noted loud snoring by Randell in last few months. Wakes up tired occ, no morning headaches. BP Readings from Last 3 Encounters:  02/29/20 (!) 122/92  02/28/20 (!) 155/98  02/20/20 (!) 140/95   Wt Readings from Last 3 Encounters:  02/29/20 (!) 304 lb 8 oz (138.1 kg)  02/28/20 (!) 304 lb 9.6 oz (138.2 kg)  02/20/20 (!) 303 lb (137.4 kg)   Also due for CPX and wishes to discuss birth control. Uses condoms and not currently sexually active.   This visit occurred during the SARS-CoV-2 public health emergency.  Safety protocols were in place, including screening questions prior to the visit, additional usage of staff PPE, and extensive cleaning of exam room while observing appropriate contact time as indicated for disinfecting solutions.   COVID 19 screen:  No recent travel or known exposure to COVID19 The patient denies respiratory symptoms of COVID 19 at this time. The importance of social distancing was discussed today.     Review of Systems  Constitutional: Negative for chills and fever.  HENT: Negative for congestion and ear pain.   Eyes: Negative for pain and redness.  Respiratory: Negative for cough and shortness of breath.   Cardiovascular: Negative for chest pain, palpitations and leg swelling.   Gastrointestinal: Negative for abdominal pain, blood in stool, constipation, diarrhea, nausea and vomiting.  Genitourinary: Negative for dysuria.  Musculoskeletal: Negative for falls and myalgias.  Skin: Negative for rash.  Neurological: Negative for dizziness.  Psychiatric/Behavioral: Negative for depression. The patient is not nervous/anxious.       Past Medical History:  Diagnosis Date  . Anxiety   . Depression   . History of MRSA infection   . Migraine   . Pilonidal cyst   . Polycystic ovaries     reports that she has never smoked. She has never used smokeless tobacco. She reports current alcohol use. She reports that she does not use drugs.   Current Outpatient Medications:  .  cyclobenzaprine (FLEXERIL) 10 MG tablet, Take 0.5-1 tablets (5-10 mg total) by mouth at bedtime. (Patient taking differently: Take 5-10 mg by mouth 3 (three) times daily as needed (spasms). ), Disp: 30 tablet, Rfl: 1 .  ibuprofen (ADVIL) 800 MG tablet, Take 1 tablet (800 mg total) by mouth every 8 (eight) hours as needed for mild pain or moderate pain., Disp: 30 tablet, Rfl: 1   Observations/Objective: Blood pressure (!) 122/92, pulse 96, temperature 98.1 F (36.7 C), temperature source Temporal, height 5' 7.25" (1.708 m), weight (!) 304 lb 8 oz (138.1 kg), SpO2 96 %.  Physical Exam Constitutional:      General: She is not in acute distress.    Appearance: Normal appearance. She is well-developed. She is obese. She is not  ill-appearing or toxic-appearing.  HENT:     Head: Normocephalic.     Right Ear: Hearing, tympanic membrane, ear canal and external ear normal. Tympanic membrane is not erythematous, retracted or bulging.     Left Ear: Hearing, tympanic membrane, ear canal and external ear normal. Tympanic membrane is not erythematous, retracted or bulging.     Nose: No mucosal edema or rhinorrhea.     Right Sinus: No maxillary sinus tenderness or frontal sinus tenderness.     Left Sinus: No  maxillary sinus tenderness or frontal sinus tenderness.     Mouth/Throat:     Pharynx: Uvula midline.  Eyes:     General: Lids are normal. Lids are everted, no foreign bodies appreciated.     Conjunctiva/sclera: Conjunctivae normal.     Pupils: Pupils are equal, round, and reactive to light.  Neck:     Thyroid: No thyroid mass or thyromegaly.     Vascular: No carotid bruit.     Trachea: Trachea normal.  Cardiovascular:     Rate and Rhythm: Normal rate and regular rhythm.     Pulses: Normal pulses.     Heart sounds: Normal heart sounds, S1 normal and S2 normal. No murmur. No friction rub. No gallop.   Pulmonary:     Effort: Pulmonary effort is normal. No tachypnea or respiratory distress.     Breath sounds: Normal breath sounds. No decreased breath sounds, wheezing, rhonchi or rales.  Abdominal:     General: Bowel sounds are normal.     Palpations: Abdomen is soft.     Tenderness: There is no abdominal tenderness.  Musculoskeletal:     Cervical back: Normal range of motion and neck supple.  Skin:    General: Skin is warm and dry.     Findings: No rash.  Neurological:     Mental Status: She is alert.  Psychiatric:        Mood and Affect: Mood is not anxious or depressed.        Speech: Speech normal.        Behavior: Behavior normal. Behavior is cooperative.        Thought Content: Thought content normal.        Judgment: Judgment normal.      Assessment and Plan Depression, major, single episode, moderate (HCC) Poor control. Eval TSH.  Start back on sertraline 50 mg daily.  Close follow up in 4 weeks.  Difficulty concentrating Liek[place] due to mood, but once mood improved consider referral for ADD testing.  Morbid obesity ([place]) Encouraged exercise, weight loss, healthy eating habits.   Snoring Refer to sleep specialist as she is high risk for sleep apnea and has concerning symptoms.       Eliezer Lofts, MD

## 2020-03-04 ENCOUNTER — Ambulatory Visit: Payer: 59

## 2020-03-04 ENCOUNTER — Other Ambulatory Visit: Payer: Self-pay

## 2020-03-04 DIAGNOSIS — L0591 Pilonidal cyst without abscess: Secondary | ICD-10-CM

## 2020-03-04 NOTE — Progress Notes (Signed)
Patient came in today for a wound check.  The wound is clean, with no signs of infection noted. Gauze wet to dry dressing changed. Follow up as scheduled.

## 2020-03-05 ENCOUNTER — Other Ambulatory Visit: Payer: Self-pay

## 2020-03-05 ENCOUNTER — Ambulatory Visit (INDEPENDENT_AMBULATORY_CARE_PROVIDER_SITE_OTHER): Payer: Self-pay

## 2020-03-05 DIAGNOSIS — L0591 Pilonidal cyst without abscess: Secondary | ICD-10-CM

## 2020-03-05 NOTE — Progress Notes (Signed)
Patient came in today for a wound check.  The wound is clean, with no signs of infection noted. Wet to dry dressing gauze changed. Follow up as scheduled.

## 2020-03-06 ENCOUNTER — Other Ambulatory Visit: Payer: Self-pay

## 2020-03-06 ENCOUNTER — Ambulatory Visit (INDEPENDENT_AMBULATORY_CARE_PROVIDER_SITE_OTHER): Payer: Self-pay

## 2020-03-06 DIAGNOSIS — L0591 Pilonidal cyst without abscess: Secondary | ICD-10-CM

## 2020-03-06 NOTE — Progress Notes (Signed)
Patient came in today for a wound check.  The wound is clean, with no signs of infection noted. Wet to dry dressing gauze changed. Follow up as scheduled.

## 2020-03-07 ENCOUNTER — Ambulatory Visit (INDEPENDENT_AMBULATORY_CARE_PROVIDER_SITE_OTHER): Payer: Self-pay | Admitting: Emergency Medicine

## 2020-03-07 ENCOUNTER — Other Ambulatory Visit: Payer: Self-pay

## 2020-03-07 DIAGNOSIS — L0591 Pilonidal cyst without abscess: Secondary | ICD-10-CM

## 2020-03-07 NOTE — Patient Instructions (Addendum)
Patient came in today for a wound check.  The wound is clean, with no signs of infection noted. Wet to dry dressing gauze changed.   Pt is going to see if Step mom can do wound dressing tomorrow if not she is going to contact office.

## 2020-03-08 ENCOUNTER — Ambulatory Visit (INDEPENDENT_AMBULATORY_CARE_PROVIDER_SITE_OTHER): Payer: Self-pay

## 2020-03-08 ENCOUNTER — Other Ambulatory Visit: Payer: Self-pay

## 2020-03-08 DIAGNOSIS — L0591 Pilonidal cyst without abscess: Secondary | ICD-10-CM

## 2020-03-11 ENCOUNTER — Other Ambulatory Visit: Payer: Self-pay

## 2020-03-11 ENCOUNTER — Ambulatory Visit (INDEPENDENT_AMBULATORY_CARE_PROVIDER_SITE_OTHER): Payer: Self-pay

## 2020-03-11 DIAGNOSIS — L0591 Pilonidal cyst without abscess: Secondary | ICD-10-CM

## 2020-03-11 NOTE — Patient Instructions (Signed)
Patient came in today for a wound check.  The wound is clean, with no signs of infection noted. Wet to dry dressing gauze changed. Follow up as scheduled.

## 2020-03-12 ENCOUNTER — Ambulatory Visit (INDEPENDENT_AMBULATORY_CARE_PROVIDER_SITE_OTHER): Payer: Self-pay

## 2020-03-12 DIAGNOSIS — L0591 Pilonidal cyst without abscess: Secondary | ICD-10-CM

## 2020-03-12 NOTE — Progress Notes (Signed)
Patient came in today for a wound check. The wound is clean, with no signs of infection noted. Wet to dry gauze packing removed and wound repacked. Follow up as scheduled.

## 2020-03-13 ENCOUNTER — Other Ambulatory Visit: Payer: Self-pay

## 2020-03-13 ENCOUNTER — Ambulatory Visit (INDEPENDENT_AMBULATORY_CARE_PROVIDER_SITE_OTHER): Payer: Self-pay

## 2020-03-13 DIAGNOSIS — L0591 Pilonidal cyst without abscess: Secondary | ICD-10-CM

## 2020-03-13 NOTE — Progress Notes (Signed)
Patient came in today for a wound check.  The wound is clean, with no signs of infection noted. Wed to dry packing removed and new packing and dressing placed. Follow up as scheduled.

## 2020-03-14 ENCOUNTER — Other Ambulatory Visit: Payer: Self-pay

## 2020-03-14 ENCOUNTER — Ambulatory Visit (INDEPENDENT_AMBULATORY_CARE_PROVIDER_SITE_OTHER): Payer: Self-pay

## 2020-03-14 DIAGNOSIS — Z09 Encounter for follow-up examination after completed treatment for conditions other than malignant neoplasm: Secondary | ICD-10-CM

## 2020-03-14 NOTE — Patient Instructions (Signed)
Patient came in today for a wound check.  The wound is clean, with no signs of infection noted. Wed to dry packing removed and new packing and dressing placed. Follow up as scheduled.

## 2020-03-15 ENCOUNTER — Other Ambulatory Visit: Payer: Self-pay

## 2020-03-15 ENCOUNTER — Ambulatory Visit (INDEPENDENT_AMBULATORY_CARE_PROVIDER_SITE_OTHER): Payer: Self-pay

## 2020-03-15 DIAGNOSIS — L0591 Pilonidal cyst without abscess: Secondary | ICD-10-CM

## 2020-03-15 NOTE — Progress Notes (Signed)
Patient came in today for a wound check.  The wound is clean, with no signs of infection noted. Wet to dry gauze removed and area repacked. Follow up as scheduled.

## 2020-03-18 ENCOUNTER — Other Ambulatory Visit: Payer: Self-pay

## 2020-03-18 ENCOUNTER — Ambulatory Visit: Payer: 59

## 2020-03-18 DIAGNOSIS — L0591 Pilonidal cyst without abscess: Secondary | ICD-10-CM

## 2020-03-18 NOTE — Progress Notes (Signed)
Patient came in today for a wound check.  The wound is clean, with no signs of infection noted. Wet to dry dressing removed and area repacked. Follow up as scheduled.

## 2020-03-19 ENCOUNTER — Ambulatory Visit (INDEPENDENT_AMBULATORY_CARE_PROVIDER_SITE_OTHER): Payer: Self-pay | Admitting: Emergency Medicine

## 2020-03-19 ENCOUNTER — Other Ambulatory Visit: Payer: Self-pay

## 2020-03-19 DIAGNOSIS — L0591 Pilonidal cyst without abscess: Secondary | ICD-10-CM

## 2020-03-19 NOTE — Patient Instructions (Signed)
Patient came in today for a wound check.  The wound is clean, with no signs of infection noted. Wet to dry dressing removed and area repacked. Follow up tomorrow for a nurse visit.

## 2020-03-20 ENCOUNTER — Other Ambulatory Visit: Payer: Self-pay

## 2020-03-20 ENCOUNTER — Ambulatory Visit (INDEPENDENT_AMBULATORY_CARE_PROVIDER_SITE_OTHER): Payer: Self-pay

## 2020-03-20 DIAGNOSIS — L0591 Pilonidal cyst without abscess: Secondary | ICD-10-CM

## 2020-03-20 NOTE — Progress Notes (Signed)
Patient came in today for a wound check. The wound is clean, with no signs of infection noted. Wet to dry dressing removed and area repacked. Follow up as scheduled.

## 2020-03-21 ENCOUNTER — Other Ambulatory Visit: Payer: Self-pay

## 2020-03-21 ENCOUNTER — Ambulatory Visit (INDEPENDENT_AMBULATORY_CARE_PROVIDER_SITE_OTHER): Payer: Self-pay

## 2020-03-21 DIAGNOSIS — Z09 Encounter for follow-up examination after completed treatment for conditions other than malignant neoplasm: Secondary | ICD-10-CM

## 2020-03-21 NOTE — Patient Instructions (Signed)
Patient came in today for a wound check. The wound is clean, with no signs of infection noted. Wet to dry dressing removed and area repacked. Follow up as scheduled.

## 2020-03-22 ENCOUNTER — Other Ambulatory Visit: Payer: Self-pay

## 2020-03-22 ENCOUNTER — Ambulatory Visit (INDEPENDENT_AMBULATORY_CARE_PROVIDER_SITE_OTHER): Payer: Self-pay

## 2020-03-22 DIAGNOSIS — L0591 Pilonidal cyst without abscess: Secondary | ICD-10-CM

## 2020-03-22 NOTE — Progress Notes (Signed)
Patient came in today for a wound check.  The wound is clean, with no signs of infection noted. Wet to dry dressing removed and area repacked. Follow up as scheduled.

## 2020-03-25 ENCOUNTER — Ambulatory Visit (INDEPENDENT_AMBULATORY_CARE_PROVIDER_SITE_OTHER): Payer: Self-pay

## 2020-03-25 ENCOUNTER — Other Ambulatory Visit: Payer: 59

## 2020-03-25 ENCOUNTER — Other Ambulatory Visit: Payer: Self-pay

## 2020-03-25 DIAGNOSIS — L0591 Pilonidal cyst without abscess: Secondary | ICD-10-CM

## 2020-03-25 NOTE — Progress Notes (Signed)
Patient came in today for a wound check.  The wound is clean, with no signs of infection noted. Wet to dry dressing removed and area repacked. Follow up as scheduled.

## 2020-03-26 ENCOUNTER — Other Ambulatory Visit: Payer: Self-pay

## 2020-03-26 ENCOUNTER — Ambulatory Visit (INDEPENDENT_AMBULATORY_CARE_PROVIDER_SITE_OTHER): Payer: Self-pay | Admitting: Emergency Medicine

## 2020-03-26 DIAGNOSIS — L0591 Pilonidal cyst without abscess: Secondary | ICD-10-CM

## 2020-03-26 NOTE — Patient Instructions (Signed)
Patient came in today for a wound check. The wound is clean, with no signs of infection noted. Wet to dry dressing removed and area repacked.Please see your appointment below.

## 2020-03-27 ENCOUNTER — Ambulatory Visit (INDEPENDENT_AMBULATORY_CARE_PROVIDER_SITE_OTHER): Payer: Self-pay

## 2020-03-27 ENCOUNTER — Other Ambulatory Visit: Payer: Self-pay

## 2020-03-27 DIAGNOSIS — Z09 Encounter for follow-up examination after completed treatment for conditions other than malignant neoplasm: Secondary | ICD-10-CM

## 2020-03-27 NOTE — Patient Instructions (Signed)
Patient came in today for a wound check. The wound is clean, with no signs of infection noted. Wet to dry dressing removed and area repacked.Please see your appointment below.

## 2020-03-28 ENCOUNTER — Ambulatory Visit (INDEPENDENT_AMBULATORY_CARE_PROVIDER_SITE_OTHER): Payer: Self-pay

## 2020-03-28 ENCOUNTER — Other Ambulatory Visit: Payer: Self-pay

## 2020-03-28 ENCOUNTER — Encounter: Payer: 59 | Admitting: Family Medicine

## 2020-03-28 DIAGNOSIS — Z09 Encounter for follow-up examination after completed treatment for conditions other than malignant neoplasm: Secondary | ICD-10-CM

## 2020-03-28 NOTE — Patient Instructions (Signed)
Patient came in today for a wound check. The wound is clean, with no signs of infection noted. Wet to dry dressing removed and area repacked.Please see your appointment below.

## 2020-03-29 ENCOUNTER — Ambulatory Visit: Payer: 59

## 2020-04-01 ENCOUNTER — Ambulatory Visit: Payer: Self-pay

## 2020-04-01 ENCOUNTER — Encounter: Payer: Self-pay | Admitting: Surgery

## 2020-04-01 ENCOUNTER — Other Ambulatory Visit: Payer: Self-pay

## 2020-04-01 ENCOUNTER — Ambulatory Visit (INDEPENDENT_AMBULATORY_CARE_PROVIDER_SITE_OTHER): Payer: Self-pay | Admitting: Surgery

## 2020-04-01 VITALS — BP 136/88 | HR 108 | Temp 97.5°F | Resp 12 | Wt 300.2 lb

## 2020-04-01 DIAGNOSIS — L0591 Pilonidal cyst without abscess: Secondary | ICD-10-CM

## 2020-04-01 DIAGNOSIS — Z09 Encounter for follow-up examination after completed treatment for conditions other than malignant neoplasm: Secondary | ICD-10-CM

## 2020-04-01 NOTE — Progress Notes (Signed)
04/01/2020  HPI: [name] is a 29 y.o. female s/p pilonidal cyst excision on 2/17.  She presents for follow up.  She has been coming here daily for RN dressing changes and she has been doing them over the weekend on her own or with her stepmom.  She reports she's been doing very well, without any pain issues, drainage issues, or other concerns.  Vital signs: BP 136/88   Pulse (!) 108   Temp (!) 97.5 F (36.4 C)   Resp 12   Wt (!) 300 lb 3.2 oz (136.2 kg)   SpO2 96%   BMI 46.67 kg/m    Physical Exam: Constitutional: No acute distress Skin:  Gluteal cleft area is healing very well, with healthy granulation tissue, without any fibrinous material, without any purulent drainage.  The wound measures about 2 x 2 cm.  Dressed with wet to dry gauze dressing.  Assessment/Plan: This is a 29 y.o. female s/p pilonidal cyst excision.  --Patient is healing very well, without complications. --Continue wet to dry dressing changes daily --Avoid submerging the wound in pool/tub/ocean until fully healed. --Follow up with me in three more weeks to assess progress.   Melvyn Neth, [facility]

## 2020-04-01 NOTE — Patient Instructions (Addendum)
Dr Hampton Abbot changed your dressing today. Placed a wet gauze on the area and a dry gauze on top.   We will see you in 3 weeks for a follow up with Dr Hampton Abbot. See your appointment below.   Pilonidal Cyst Drainage, Care After This sheet gives you information about how to care for yourself after your procedure. Your health care provider may also give you more specific instructions. If you have problems or questions, contact your health care provider. What can I expect after the procedure? After the procedure, it is common to have:  Pain that gets better when you take medicine.  Some fluid or blood coming from your wound. Follow these instructions at home: Medicines  Take over-the-counter and prescription medicines only as told by your health care provider.  If you were prescribed an antibiotic medicine, take it as told by your health care provider. Do not stop taking the antibiotic even if you start to feel better. Lifestyle  Do not do activities that irritate or put pressure on your buttocks for about 2 weeks, or as long as told by your health care provider. These activities include bike riding, running, and anything that involves a twisting motion.  Do not sit for long periods at a time without getting up to move around.  Sleep on your side instead of your back.  Avoid wearing tight underwear and tight pants. Bathing  Do not take baths or showers, swim, or use a hot tub until your health care provider approves. This depends on the type of wound you have from surgery.  While bathing, clean your buttocks area gently with soap and water.  After bathing: ? Pat the area dry with a soft, clean towel. ? Cover the area with a clean bandage (dressing), if told to by your health care provider. General instructions   If you are taking prescription pain medicine, take actions to prevent or treat constipation. Your health care provider may recommend that you: ? Drink enough fluid to keep  your urine pale yellow. ? Eat foods that are high in fiber, such as fresh fruits and vegetables, whole grains, and beans. ? Limit foods that are high in fat and processed sugars, such as fried or sweet foods. ? Take an over-the-counter or prescription medicine for constipation.  You will need to have a caregiver help you manage wound care and dressing changes. Your caregiver should: ? Wash his or her hands with soap and water before changing your dressing. If soap and water are not available, your caregiver should use hand sanitizer. ? Check your wound every day for signs of infection, such as:  Redness, swelling, or more pain.  More fluid or blood.  Warmth.  Pus or a bad smell. ? Follow any additional instructions from your health care provider on how to care for your wound, such as wound cleaning, wound flushing (irrigation), or packing your wound with a dressing.  Keep all follow-up visits as told by your health care provider. This is important. If you had incision and drainage with wound packing:  Return to your health care provider as instructed to have your packing material changed or removed.  Keep the area dry until your packing has been removed.  After the packing has been removed, you may start taking showers. If you had marsupialization:  You may start taking showers the day after surgery, or when your health care provider approves.  Remove your dressing before you shower, but let the water from the shower  moisten your dressing before you remove it. This will make it easier to remove.  Ask your health care provider when you can stop using a dressing. If you had incision and drainage without wound packing:  Change your dressing as directed.  Leave stitches (sutures), skin glue, or adhesive strips in place. These skin closures may need to stay in place for 2 weeks or longer. If adhesive strip edges start to loosen and curl up, you may trim the loose edges. Do not remove  adhesive strips completely unless your health care provider tells you to do that. Contact a health care provider if:  You have redness, swelling, or more pain around your wound.  You have more fluid or blood coming from your wound.  You have new bleeding from your wound.  Your wound feels warm to the touch.  There is pus or a bad smell coming from your wound.  You have pain that does not get better with medicine.  You have a fever or chills.  You have muscle aches.  You are dizzy.  You feel generally sick. Summary  After a procedure to drain a pilonidal cyst, it is common to have some fluid or blood coming from your wound.  If you were prescribed an antibiotic medicine, take it as told by your health care provider. Do not stop taking the antibiotic even if you start to feel better.  Return to your health care provider as instructed to have any packing material changed or removed. This information is not intended to replace advice given to you by your health care provider. Make sure you discuss any questions you have with your health care provider. Document Revised: 10/06/2018 Document Reviewed: 12/06/2017 Elsevier Patient Education  2020 [employer].

## 2020-04-02 ENCOUNTER — Ambulatory Visit (INDEPENDENT_AMBULATORY_CARE_PROVIDER_SITE_OTHER): Payer: Self-pay | Admitting: Emergency Medicine

## 2020-04-02 DIAGNOSIS — L0591 Pilonidal cyst without abscess: Secondary | ICD-10-CM

## 2020-04-02 NOTE — Patient Instructions (Addendum)
Patient came in today for a wound check. The wound is clean, with no signs of infection noted. Wet to dry dressing removed and area repacked.  Call the office if you need to come in for a nurse visit.

## 2020-04-05 ENCOUNTER — Ambulatory Visit (INDEPENDENT_AMBULATORY_CARE_PROVIDER_SITE_OTHER): Payer: Self-pay

## 2020-04-05 ENCOUNTER — Other Ambulatory Visit: Payer: Self-pay

## 2020-04-05 DIAGNOSIS — Z09 Encounter for follow-up examination after completed treatment for conditions other than malignant neoplasm: Secondary | ICD-10-CM

## 2020-04-05 NOTE — Patient Instructions (Signed)
Patient came in today for a wound check.  The wound is clean, with no signs of infection noted. Wet to dry dressing removed and area repacked. Follow up as scheduled.

## 2020-04-08 ENCOUNTER — Ambulatory Visit (INDEPENDENT_AMBULATORY_CARE_PROVIDER_SITE_OTHER): Payer: Self-pay

## 2020-04-08 ENCOUNTER — Other Ambulatory Visit: Payer: Self-pay

## 2020-04-08 DIAGNOSIS — Z09 Encounter for follow-up examination after completed treatment for conditions other than malignant neoplasm: Secondary | ICD-10-CM

## 2020-04-08 NOTE — Patient Instructions (Signed)
Patient came in today for a wound check. The wound is clean, with no signs of infection noted. Wet to dry dressing removed and area repacked.Follow up as scheduled.

## 2020-04-09 ENCOUNTER — Ambulatory Visit (INDEPENDENT_AMBULATORY_CARE_PROVIDER_SITE_OTHER): Payer: Self-pay | Admitting: Emergency Medicine

## 2020-04-09 ENCOUNTER — Ambulatory Visit (INDEPENDENT_AMBULATORY_CARE_PROVIDER_SITE_OTHER): Payer: 59 | Admitting: Adult Health

## 2020-04-09 ENCOUNTER — Encounter: Payer: Self-pay | Admitting: Adult Health

## 2020-04-09 ENCOUNTER — Other Ambulatory Visit: Payer: Self-pay

## 2020-04-09 DIAGNOSIS — R400 Somnolence: Secondary | ICD-10-CM | POA: Insufficient documentation

## 2020-04-09 DIAGNOSIS — L0591 Pilonidal cyst without abscess: Secondary | ICD-10-CM

## 2020-04-09 NOTE — Assessment & Plan Note (Signed)
Healthy weight loss 

## 2020-04-09 NOTE — Patient Instructions (Signed)
Patient came in today for a wound check. The wound is clean, with no signs of infection noted. Wet to dry dressing removed and area repacked.  See you appointment below.

## 2020-04-09 NOTE — Patient Instructions (Signed)
Healthy sleep regimen .  Set up for split night sleep study .  Work on healthy weight .  Do not drive if sleepy .  Follow up in 3 months with Dr. Mortimer Fries or APP on return

## 2020-04-09 NOTE — Assessment & Plan Note (Signed)
Daytime sleepiness, snoring, restless sleep, BMI 46-multiple risk factors for obstructive sleep apnea.  Set patient up for split-night sleep study.  Patient education on healthy sleep regimen  Plan  Patient Instructions  Healthy sleep regimen .  Set up for split night sleep study .  Work on healthy weight .  Do not drive if sleepy .  Follow up in 3 months with Dr. Mortimer Fries or APP on return

## 2020-04-09 NOTE — Addendum Note (Signed)
Addended by: Lia Foyer R on: 04/09/2020 03:42 PM   Modules accepted: Orders

## 2020-04-09 NOTE — Progress Notes (Signed)
@Patient  ID: [name], female    DOB: June 19, 1991, 29 y.o.   MRN: [id number]  Chief Complaint  Patient presents with  . Consult    Sleep Consult     Referring provider: Jinny Sanders, MD  HPI: 29 year old female presents April 09, 2020 for sleep consult for daytime sleepiness, restless sleep, snoring  TEST/EVENTS :   04/09/2020 Sleep Consult Patient presents for a sleep consult.  She was referred by her primary care provider.  Patient has complaints of daytime sleepiness, daytime fatigue, restless sleep and loud snoring. BMI is 46. Patient goes to sleep each night around 11 PM gets up at 5:30 AM.  Takes her about 1 hour to fall asleep.  Does not get out very much during the night.  When she wakes up she does not feel refreshed still feels very tired.  Has some chronic nasal congestion. Caffeine intake is very little. No energy drinks or stimulants.  Epworth score is 9 Patient is single.  Has no children.  Does not smoke cigarettes.  Has rare alcohol use.  Occasional marijuana use.  She works as a Medical sales representative.  Family history significant for asthma and cancer. No narcolepsy , cataplexy or sleep paralysis symptoms .  No teeth issues or TMJ.    No Known Allergies  Immunization History  Administered Date(s) Administered  . Hpv 02/16/2006, 10/25/2006, 02/25/2007  . Influenza Whole 10/21/2009  . Influenza,inj,Quad PF,6+ Mos 10/20/2016, 10/26/2017  . Janssen (J&J) SARS-COV-2 Vaccination 03/05/2020  . Meningococcal Polysaccharide 11/15/2009  . Td 10/21/2009  . Tdap 11/21/2019    Past Medical History:  Diagnosis Date  . Anxiety   . Depression   . History of MRSA infection   . Migraine   . Pilonidal cyst   . Polycystic ovaries     Tobacco History: Social History   Tobacco Use  Smoking Status Never Smoker  Smokeless Tobacco Never Used   Counseling given: Not Answered   Outpatient Medications Prior to Visit  Medication Sig Dispense Refill  .  cyclobenzaprine (FLEXERIL) 10 MG tablet Take 0.5-1 tablets (5-10 mg total) by mouth at bedtime. (Patient taking differently: Take 5-10 mg by mouth 3 (three) times daily as needed (spasms). ) 30 tablet 1  . ibuprofen (ADVIL) 800 MG tablet Take 1 tablet (800 mg total) by mouth every 8 (eight) hours as needed for mild pain or moderate pain. 30 tablet 1  . sertraline (ZOLOFT) 50 MG tablet Take 1 tablet (50 mg total) by mouth daily. 30 tablet 3   No facility-administered medications prior to visit.     Review of Systems:   Constitutional:   No  weight loss, night sweats,  Fevers, chills, fatigue, or  lassitude.  HEENT:   No headaches,  Difficulty swallowing,  Tooth/dental problems, or  Sore throat,                No sneezing, itching, ear ache, nasal congestion, post nasal drip,   CV:  No chest pain,  Orthopnea, PND, swelling in lower extremities, anasarca, dizziness, palpitations, syncope.   GI  No heartburn, indigestion, abdominal pain, nausea, vomiting, diarrhea, change in bowel habits, loss of appetite, bloody stools.   Resp: No shortness of breath with exertion or at rest.  No excess mucus, no productive cough,  No non-productive cough,  No coughing up of blood.  No change in color of mucus.  No wheezing.  No chest wall deformity  Skin: no rash or lesions.  GU: no dysuria,  change in color of urine, no urgency or frequency.  No flank pain, no hematuria   MS:  No joint pain or swelling.  No decreased range of motion.  No back pain.    Physical Exam  BP 124/88 (BP Location: Left Arm, Patient Position: Sitting, Cuff Size: Large)   Pulse 98   Temp (!) 97.1 F (36.2 C) (Temporal)   Ht 5' 7.4" (1.712 m)   Wt 299 lb 6.4 oz (135.8 kg)   SpO2 97%   BMI 46.34 kg/m   GEN: A/Ox3; pleasant , NAD, well nourished    HEENT:  [place]/AT,  EACs-clear, TMs-wnl, NOSE-clear, THROAT-clear, no lesions, no postnasal drip or exudate noted. Class 2 MP airway , elongated uvula   NECK:  Supple w/ fair  ROM; no JVD; normal carotid impulses w/o bruits; no thyromegaly or nodules palpated; no lymphadenopathy.    RESP  Clear  P & A; w/o, wheezes/ rales/ or rhonchi. no accessory muscle use, no dullness to percussion  CARD:  RRR, no m/r/g, no peripheral edema, pulses intact, no cyanosis or clubbing.  GI:   Soft & nt; nml bowel sounds; no organomegaly or masses detected.   Musco: Warm bil, no deformities or joint swelling noted.   Neuro: alert, no focal deficits noted.    Skin: Warm, no lesions or rashes    Lab Results:  CBC    Component Value Date/Time   WBC 10.9 (H) 01/19/2020 1624   RBC 4.37 01/19/2020 1624   HGB 10.6 (L) 01/19/2020 1624   HCT 34.3 (L) 01/19/2020 1624   PLT 359 01/19/2020 1624   MCV 78.5 (L) 01/19/2020 1624   MCH 24.3 (L) 01/19/2020 1624   MCHC 30.9 01/19/2020 1624   RDW 15.9 (H) 01/19/2020 1624   LYMPHSABS 1.6 01/19/2020 1624   MONOABS 1.1 (H) 01/19/2020 1624   EOSABS 0.1 01/19/2020 1624   BASOSABS 0.0 01/19/2020 1624    BMET    Component Value Date/Time   NA 141 01/19/2020 1624   NA 142 03/06/2019 0834   K 3.8 01/19/2020 1624   CL 107 01/19/2020 1624   CO2 25 01/19/2020 1624   GLUCOSE 90 01/19/2020 1624   BUN 9 01/19/2020 1624   BUN 8 03/06/2019 0834   CREATININE 0.82 01/19/2020 1624   CALCIUM 9.0 01/19/2020 1624   GFRNONAA >60 01/19/2020 1624   GFRAA >60 01/19/2020 1624    BNP No results found for: BNP  ProBNP No results found for: PROBNP  Imaging: No results found.    No flowsheet data found.  No results found for: NITRICOXIDE      Assessment & Plan:   Daytime sleepiness Daytime sleepiness, snoring, restless sleep, BMI 46-multiple risk factors for obstructive sleep apnea.  Set patient up for split-night sleep study.  Patient education on healthy sleep regimen  Plan  Patient Instructions  Healthy sleep regimen .  Set up for split night sleep study .  Work on healthy weight .  Do not drive if sleepy .  Follow up in 3  months with Dr. Mortimer Fries or APP on return       Morbid obesity ([place]) Healthy weight loss     Rexene Edison, NP 04/09/2020

## 2020-04-11 ENCOUNTER — Other Ambulatory Visit: Payer: Self-pay

## 2020-04-11 ENCOUNTER — Ambulatory Visit (INDEPENDENT_AMBULATORY_CARE_PROVIDER_SITE_OTHER): Payer: Self-pay | Admitting: Emergency Medicine

## 2020-04-11 DIAGNOSIS — L0591 Pilonidal cyst without abscess: Secondary | ICD-10-CM

## 2020-04-11 NOTE — Patient Instructions (Signed)
Patient came in today for a wound check. The wound is clean, with no signs of infection noted. Wet to dry dressing removed and area repacked.  Continue to pack the area at home. If you need a nurse visit call the office. See your appointment below to follow up with Dr [name]

## 2020-04-12 ENCOUNTER — Ambulatory Visit (INDEPENDENT_AMBULATORY_CARE_PROVIDER_SITE_OTHER): Payer: 59 | Admitting: Family Medicine

## 2020-04-12 ENCOUNTER — Encounter: Payer: Self-pay | Admitting: Family Medicine

## 2020-04-12 VITALS — BP 124/84 | HR 84 | Temp 98.0°F | Ht 67.0 in | Wt 296.5 lb

## 2020-04-12 DIAGNOSIS — Z0000 Encounter for general adult medical examination without abnormal findings: Secondary | ICD-10-CM

## 2020-04-12 DIAGNOSIS — Z309 Encounter for contraceptive management, unspecified: Secondary | ICD-10-CM | POA: Diagnosis not present

## 2020-04-12 DIAGNOSIS — G8929 Other chronic pain: Secondary | ICD-10-CM

## 2020-04-12 DIAGNOSIS — F321 Major depressive disorder, single episode, moderate: Secondary | ICD-10-CM

## 2020-04-12 DIAGNOSIS — M549 Dorsalgia, unspecified: Secondary | ICD-10-CM | POA: Diagnosis not present

## 2020-04-12 LAB — POCT URINE PREGNANCY: Preg Test, Ur: NEGATIVE

## 2020-04-12 MED ORDER — CYCLOBENZAPRINE HCL 10 MG PO TABS: 5.0000 mg | ORAL_TABLET | Freq: Every day | ORAL | 1 refills | Status: DC

## 2020-04-12 NOTE — Assessment & Plan Note (Signed)
Using flexeril as needed for pain and spasm.

## 2020-04-12 NOTE — Assessment & Plan Note (Signed)
Well controlled. Continue current medication.  

## 2020-04-12 NOTE — Addendum Note (Signed)
Addended by: Carter Kitten on: 04/12/2020 04:16 PM   Modules accepted: Orders

## 2020-04-12 NOTE — Patient Instructions (Addendum)
Work on [employer] and regular exercise.  Plan Depo initiation.. make appt when menses begins.

## 2020-04-12 NOTE — Progress Notes (Addendum)
Chief Complaint  Patient presents with  . Annual Exam    History of Present Illness: HPI    29 year old female presents for wellness. Recent issues with pilonidal cyst infeciton and removal. Dr. Hampton Abbot Gen Surg Morbid obesity  Wt Readings from Last 3 Encounters:  04/12/20 296 lb 8 oz (134.5 kg)  04/09/20 299 lb 6.4 oz (135.8 kg)  04/01/20 (!) 300 lb 3.2 oz (136.2 kg)  Body mass index is 46.44 kg/m.   MDD: Well contolled on sertraline 50 mg daily.  PHQ9: 7  Exercise:  minimal Diet: moderate BP Readings from Last 3 Encounters:  04/12/20 124/84  04/09/20 124/88  04/01/20 136/88   Has upcoming sleep test coming up to evaulate for sleep apnea.  LMP 03/15/2020  Discussed birth control options.  This visit occurred during the SARS-CoV-2 public health emergency.  Safety protocols were in place, including screening questions prior to the visit, additional usage of staff PPE, and extensive cleaning of exam room while observing appropriate contact time as indicated for disinfecting solutions.   COVID 19 screen:  No recent travel or known exposure to COVID19 The patient denies respiratory symptoms of COVID 19 at this time. The importance of social distancing was discussed today.     Review of Systems  Constitutional: Negative for chills and fever.  HENT: Negative for congestion and ear pain.   Eyes: Negative for pain and redness.  Respiratory: Negative for cough and shortness of breath.   Cardiovascular: Negative for chest pain, palpitations and leg swelling.  Gastrointestinal: Negative for abdominal pain, blood in stool, constipation, diarrhea, nausea and vomiting.  Genitourinary: Negative for dysuria.  Musculoskeletal: Negative for falls and myalgias.  Skin: Negative for rash.  Neurological: Negative for dizziness.  Psychiatric/Behavioral: Negative for depression. The patient is not nervous/anxious.       Past Medical History:  Diagnosis Date  . Anxiety   .  Depression   . History of MRSA infection   . Migraine   . Pilonidal cyst   . Polycystic ovaries     reports that she has never smoked. She has never used smokeless tobacco. She reports current alcohol use. She reports current drug use. Drug: Marijuana.   Current Outpatient Medications:  .  cyclobenzaprine (FLEXERIL) 10 MG tablet, Take 0.5-1 tablets (5-10 mg total) by mouth at bedtime., Disp: 30 tablet, Rfl: 1 .  ibuprofen (ADVIL) 800 MG tablet, Take 1 tablet (800 mg total) by mouth every 8 (eight) hours as needed for mild pain or moderate pain., Disp: 30 tablet, Rfl: 1 .  sertraline (ZOLOFT) 50 MG tablet, Take 1 tablet (50 mg total) by mouth daily., Disp: 30 tablet, Rfl: 3   Observations/Objective: Blood pressure 124/84, pulse 84, temperature 98 F (36.7 C), temperature source Temporal, height 5\' 7"  (1.702 m), weight 296 lb 8 oz (134.5 kg), last menstrual period 03/15/2020, SpO2 97 %.  Physical Exam Constitutional:      General: She is not in acute distress.    Appearance: Normal appearance. She is well-developed. She is not ill-appearing or toxic-appearing.  HENT:     Head: Normocephalic.     Right Ear: Hearing, tympanic membrane, ear canal and external ear normal.     Left Ear: Hearing, tympanic membrane, ear canal and external ear normal.     Nose: Nose normal.  Eyes:     General: Lids are normal. Lids are everted, no foreign bodies appreciated.     Conjunctiva/sclera: Conjunctivae normal.     Pupils: Pupils  are equal, round, and reactive to light.  Neck:     Thyroid: No thyroid mass or thyromegaly.     Vascular: No carotid bruit.     Trachea: Trachea normal.  Cardiovascular:     Rate and Rhythm: Normal rate and regular rhythm.     Heart sounds: Normal heart sounds, S1 normal and S2 normal. No murmur. No gallop.   Pulmonary:     Effort: Pulmonary effort is normal. No respiratory distress.     Breath sounds: Normal breath sounds. No wheezing, rhonchi or rales.  Abdominal:      General: Bowel sounds are normal. There is no distension or abdominal bruit.     Palpations: Abdomen is soft. There is no fluid wave or mass.     Tenderness: There is no abdominal tenderness. There is no guarding or rebound.     Hernia: No hernia is present.  Musculoskeletal:     Cervical back: Normal range of motion and neck supple.  Lymphadenopathy:     Cervical: No cervical adenopathy.  Skin:    General: Skin is warm and dry.     Findings: No rash.  Neurological:     Mental Status: She is alert.     Cranial Nerves: No cranial nerve deficit.     Sensory: No sensory deficit.  Psychiatric:        Mood and Affect: Mood is not anxious or depressed.        Speech: Speech normal.        Behavior: Behavior normal. Behavior is cooperative.        Judgment: Judgment normal.      Assessment and Plan    The patient's preventative maintenance and recommended screening tests for an annual wellness exam were reviewed in full today. Brought up to date unless services declined.  Counselled on the importance of diet, exercise, and its role in overall health and mortality. The patient's FH and SH was reviewed, including their home life, tobacco status, and drug and alcohol status.   Vaccines: uptodate.   She has received COVID19 vaccine JnJ. PAP/DVE:last pap 02/2019, repeat 3 years given HPV not tested.  No need for STD testing. No early family history breast cancer or colon cancer.  Depression, major, single episode, moderate ([place]) Well controlled. Continue current medication.   Chronic upper back pain Using flexeril as needed for pain and spasm.   Eliezer Lofts, MD

## 2020-04-15 ENCOUNTER — Other Ambulatory Visit: Payer: 59

## 2020-04-15 ENCOUNTER — Other Ambulatory Visit (INDEPENDENT_AMBULATORY_CARE_PROVIDER_SITE_OTHER): Payer: 59

## 2020-04-15 ENCOUNTER — Other Ambulatory Visit: Payer: Self-pay

## 2020-04-15 DIAGNOSIS — Z131 Encounter for screening for diabetes mellitus: Secondary | ICD-10-CM | POA: Diagnosis not present

## 2020-04-15 DIAGNOSIS — Z13220 Encounter for screening for lipoid disorders: Secondary | ICD-10-CM | POA: Diagnosis not present

## 2020-04-15 DIAGNOSIS — Z1329 Encounter for screening for other suspected endocrine disorder: Secondary | ICD-10-CM

## 2020-04-15 LAB — LIPID PANEL
Cholesterol: 174 mg/dL (ref 0–200)
HDL: 47 mg/dL (ref >39.00)
LDL Cholesterol: 110 mg/dL — ABNORMAL HIGH (ref 0–99)
NonHDL: 127.23
Total CHOL/HDL Ratio: 4
Triglycerides: 85 mg/dL (ref 0.0–149.0)
VLDL: 17 mg/dL (ref 0.0–40.0)

## 2020-04-15 LAB — COMPREHENSIVE METABOLIC PANEL
ALT: 13 U/L (ref 0–35)
AST: 13 U/L (ref 0–37)
Albumin: 4.2 g/dL (ref 3.5–5.2)
Alkaline Phosphatase: 116 U/L (ref 39–117)
BUN: 7 mg/dL (ref 6–23)
CO2: 26 mEq/L (ref 19–32)
Calcium: 9.4 mg/dL (ref 8.4–10.5)
Chloride: 106 mEq/L (ref 96–112)
Creatinine, Ser: 0.77 mg/dL (ref 0.40–1.20)
GFR: 106.37 mL/min (ref >60.00)
Glucose, Bld: 82 mg/dL (ref 70–99)
Potassium: 4 mEq/L (ref 3.5–5.1)
Sodium: 139 mEq/L (ref 135–145)
Total Bilirubin: 0.4 mg/dL (ref 0.2–1.2)
Total Protein: 7.1 g/dL (ref 6.0–8.3)

## 2020-04-15 LAB — HEMOGLOBIN A1C: Hgb A1c MFr Bld: 6.1 % (ref 4.6–6.5)

## 2020-04-15 LAB — TSH: TSH: 1.33 u[IU]/mL (ref 0.35–4.50)

## 2020-04-15 NOTE — Progress Notes (Signed)
No critical labs need to be addressed urgently. We will discuss labs in detail at upcoming office visit.   

## 2020-04-18 ENCOUNTER — Ambulatory Visit: Payer: 59

## 2020-04-22 ENCOUNTER — Encounter: Payer: Self-pay | Admitting: Surgery

## 2020-04-22 ENCOUNTER — Other Ambulatory Visit: Payer: Self-pay

## 2020-04-22 ENCOUNTER — Ambulatory Visit (INDEPENDENT_AMBULATORY_CARE_PROVIDER_SITE_OTHER): Payer: Self-pay | Admitting: Surgery

## 2020-04-22 VITALS — BP 136/87 | HR 92 | Temp 97.5°F | Ht 67.0 in | Wt 300.0 lb

## 2020-04-22 DIAGNOSIS — L0591 Pilonidal cyst without abscess: Secondary | ICD-10-CM

## 2020-04-22 DIAGNOSIS — Z09 Encounter for follow-up examination after completed treatment for conditions other than malignant neoplasm: Secondary | ICD-10-CM

## 2020-04-22 NOTE — Patient Instructions (Addendum)
Dr.Piscoya discussed with patient to that she can continue to cover the small area.  Discussed with patient with time the scar tissue will continue to heal.   Follow-up with our office as needed.  Please call and ask to speak with a nurse if you develop questions or concerns.

## 2020-04-22 NOTE — Progress Notes (Signed)
04/22/2020  HPI: [name] is a 29 y.o. female s/p pilonidal cyst excision on 2/17, allowing the wound to close under secondary intention.  She's been doing well, getting daily dressing changes.  She reports that the wound is now very small and unable to do any packing.  Denies any drainage or pain.  Vital signs: BP 136/87   Pulse 92   Temp (!) 97.5 F (36.4 C) (Temporal)   Ht 5\' 7"  (1.702 m)   Wt 300 lb (136.1 kg)   SpO2 97%   BMI 46.99 kg/m    Physical Exam: Constitutional:   No acute distress Skin:  Gluteal cleft with prior wound almost fully healed.  There is a small area that is still open, measuring about 3 mm x 2 mm x 1 mm depth.  Dry gauze dressing applied to cover.  Assessment/Plan: This is a 29 y.o. female s/p pilonidal cyst excision.  --Patient is almost fully healed.  She can continue with superficial wound dressing changes daily. --Reminded her to keep the area clean and dry after the wound is fully covered to allow for the new skin to heal well without getting macerated. --Follow up PRN.   Melvyn Neth, [facility]

## 2020-04-26 IMAGING — CT CT ABD-PELV W/ CM
2 of 4 series · 16 of 46 positions shown, 18 images · IV contrast (APPLIED)
Comparison: None.

CLINICAL DATA: 28-year-old female with perianal abscess.

EXAM:
CT ABDOMEN AND PELVIS WITH CONTRAST
TECHNIQUE: Multidetector CT imaging of the abdomen and pelvis was performed
using the standard protocol following bolus administration of
intravenous contrast.
CONTRAST:  100mL OMNIPAQUE IOHEXOL 300 MG/ML  SOLN

[Series 2: routine abd/pel with · axial · 0.85mm/px · z∈[-948,-423]mm · 13 of 115 slices shown, 15 images]
[im 5/115  soft-tissue]
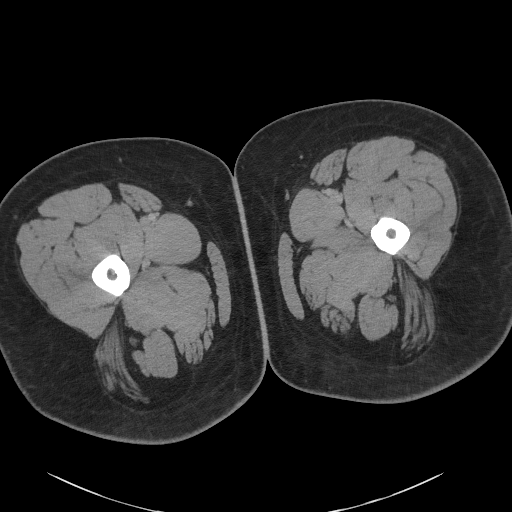
[im 5/115  bone]
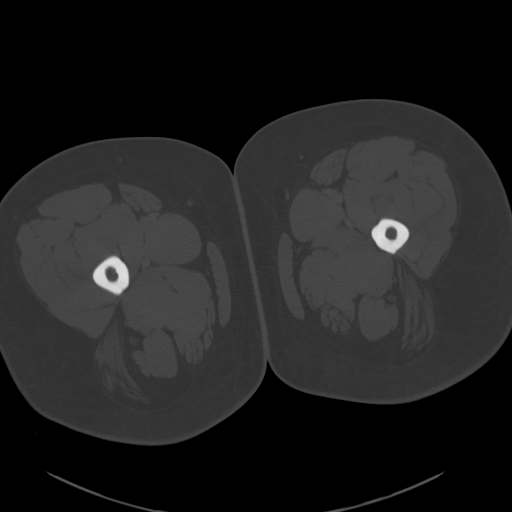
[im 15/115  soft-tissue]
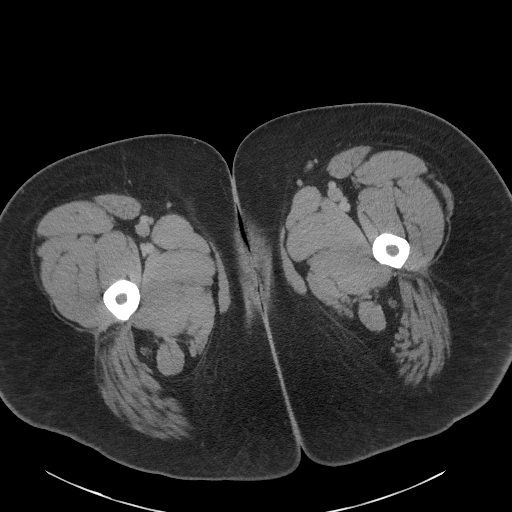
[im 24/115  soft-tissue]
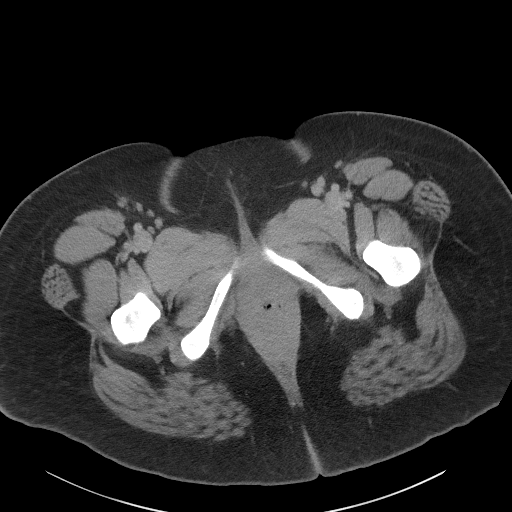
[im 34/115  soft-tissue]
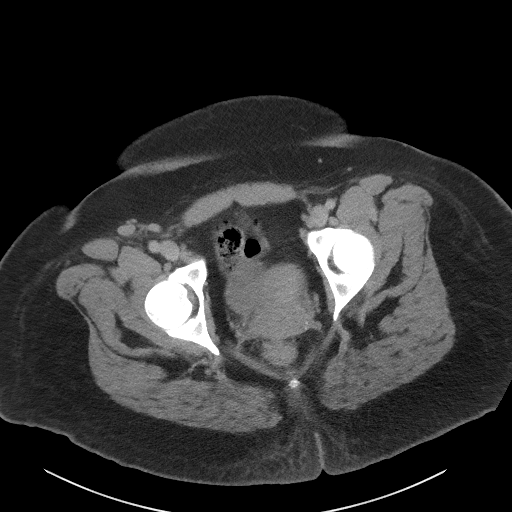
[im 39/115  soft-tissue]
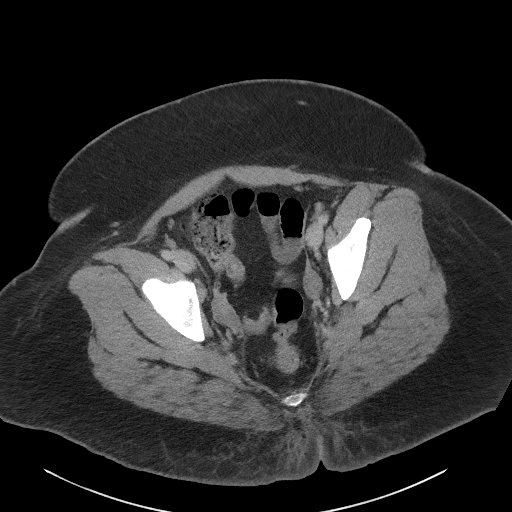
[im 48/115  soft-tissue]
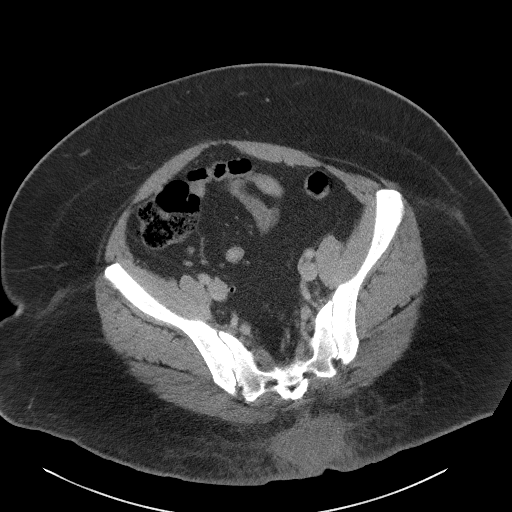
[im 58/115  soft-tissue]
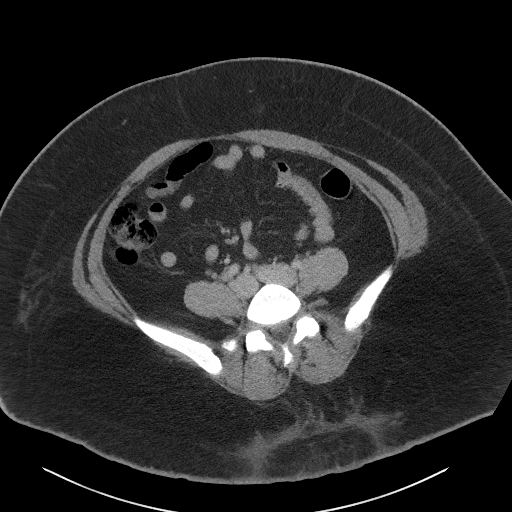
[im 67/115  soft-tissue]
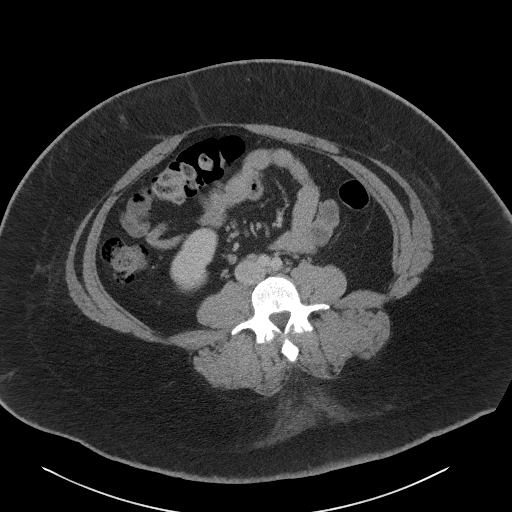
[im 77/115  soft-tissue]
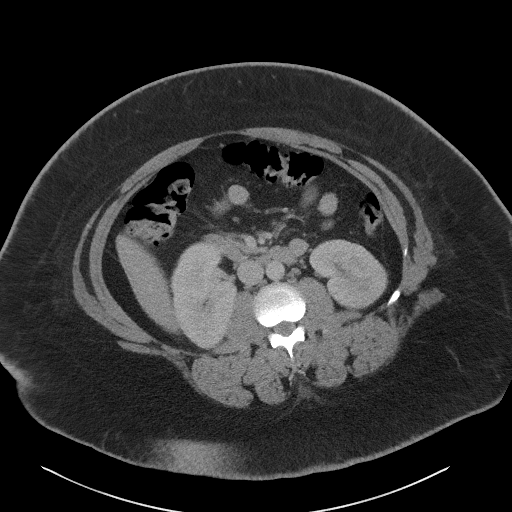
[im 77/115  bone]
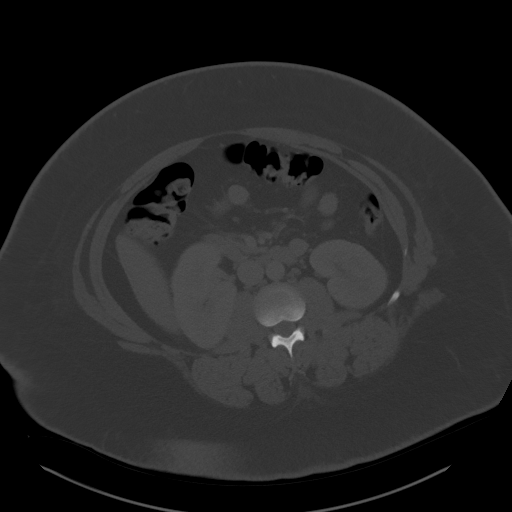
[im 81/115  soft-tissue]
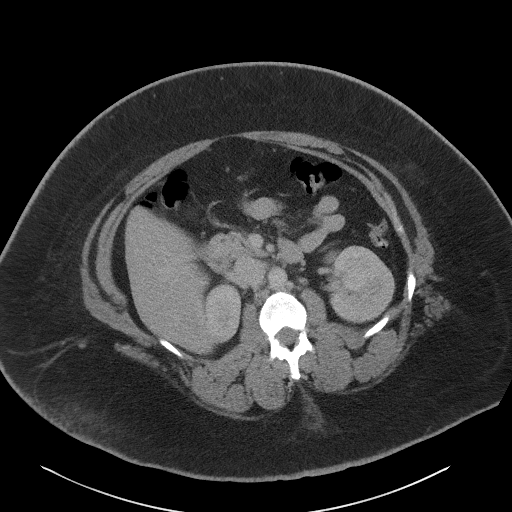
[im 91/115  soft-tissue]
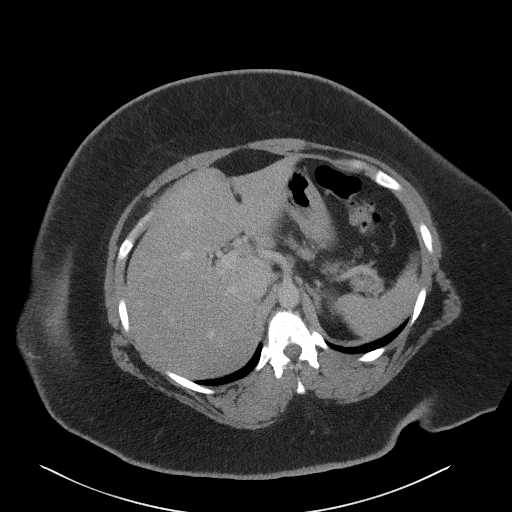
[im 100/115  soft-tissue]
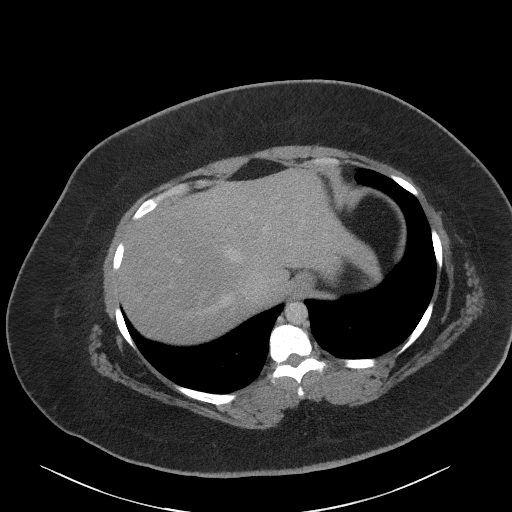
[im 110/115  soft-tissue]
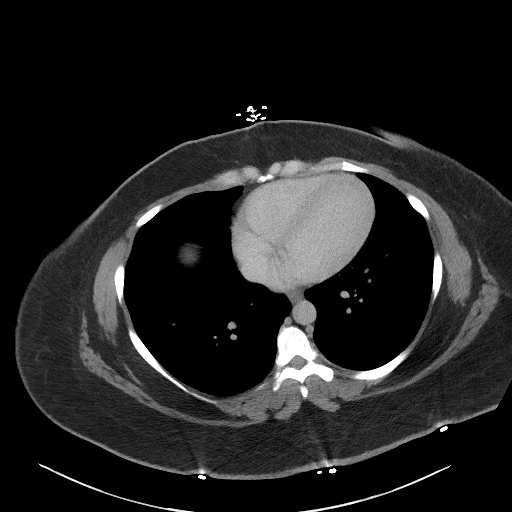

[Series 5: coronal st · coronal · 0.97mm/px · 3 of 123 slices shown]
[im 41/123  soft-tissue]
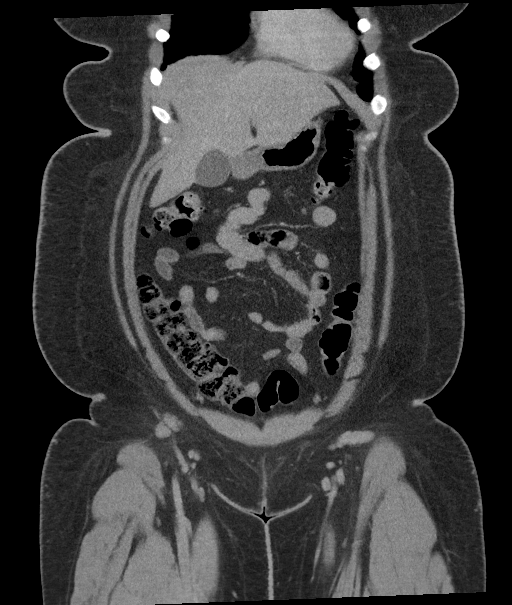
[im 55/123  soft-tissue]
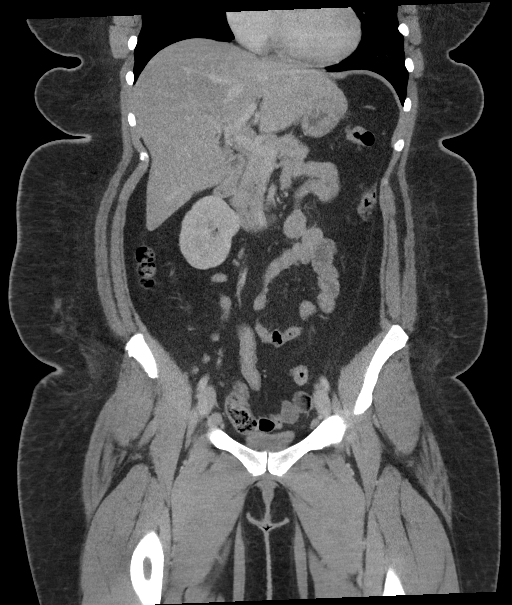
[im 68/123  soft-tissue]
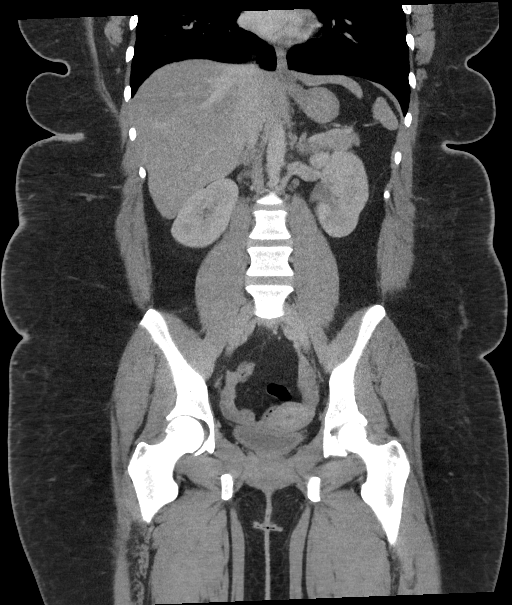

[16 of 46 positions shown; findings below may reference images not displayed]

FINDINGS: Lower chest: The visualized lung bases are clear.

No intra-abdominal free air or free fluid.

Hepatobiliary: Diffuse fatty infiltration of the liver. No
intrahepatic biliary ductal dilatation. The gallbladder is
unremarkable.

Pancreas: Unremarkable. No pancreatic ductal dilatation or
surrounding inflammatory changes.

Spleen: Normal in size without focal abnormality.

Adrenals/Urinary Tract: The adrenal glands are unremarkable. There
is no hydronephrosis on either side. Subcentimeter left renal
hypodense lesions too small to characterize. The visualized ureters
appear unremarkable. The urinary bladder is minimally distended.
There is apparent diffuse thickening of the bladder wall which may
be partly related to underdistention. Cystitis is not excluded.
Correlation with urinalysis recommended.

Stomach/Bowel: Small scattered colonic diverticula without active
inflammatory changes. There is no bowel obstruction or active
inflammation. The appendix is normal.

Vascular/Lymphatic: The abdominal aorta and IVC are unremarkable. No
portal venous gas. There is no adenopathy.

Reproductive: The uterus is anteverted and grossly unremarkable. No
adnexal masses.

Other: There is a 6.4 x 3.5 by 5.0 cm collection with stranding of
the adjacent fat and a small pocket of air in the subcutaneous soft
tissues of the posterior sacral region immediately above the
intergluteal cleft most consistent with an infected collection or a
pilonidal abscess. Clinical correlation is recommended.

Musculoskeletal: No acute or significant osseous findings.
IMPRESSION: Infected collection/pilonidal abscess in the subcutaneous soft
tissues of the posterior sacrum along the superior aspect of the
intergluteal cleft. Clinical correlation is recommended.

## 2020-05-13 ENCOUNTER — Telehealth: Payer: Self-pay | Admitting: Adult Health

## 2020-05-13 DIAGNOSIS — R400 Somnolence: Secondary | ICD-10-CM

## 2020-05-13 NOTE — Telephone Encounter (Signed)
Spoke with patient. She is ok with proceeding with a home sleep study. Will go ahead and place the order. She is aware of the process.   Nothing further needed at time of call.

## 2020-05-13 NOTE — Telephone Encounter (Signed)
Home sleep study 

## 2020-05-13 NOTE — Telephone Encounter (Signed)
Pt's insurance [facility]) denied in lab study due to pt not meeting criteria.  A peer to peer will need to be done by calling [phone] or either order a Home Sleep Study for patient.   If a HST order is placed, please cancel the in lab study order.   Thank you!  Rhonda J Cobb

## 2020-05-13 NOTE — Telephone Encounter (Signed)
TP- do you want to to do peer to peer for this in lab or cancel it and order home sleep study?

## 2020-05-13 NOTE — Telephone Encounter (Signed)
Tammy please advise if you would like to order home sleep study and cancel the other one.  Pt's insurance [facility]) denied in lab study due to pt not meeting criteria.  A peer to peer will need to be done by calling [phone] or either order a Home Sleep Study for patient.   If a HST order is placed, please cancel the in lab study order.

## 2020-05-13 NOTE — Telephone Encounter (Signed)
That is fine to set up for home sleep study thank you

## 2020-06-28 ENCOUNTER — Telehealth: Payer: Self-pay | Admitting: Adult Health

## 2020-06-28 NOTE — Telephone Encounter (Signed)
Returned patient's call and scheduled HST for 07/22/2020 at 2:30. Pt offered earlier appointment, however, she was going to be out of town. Pt is aware of appointment for 07/22/2020 at 2:30 at the [facility]. Nothing else needed at this time. Rhonda J Cobb

## 2020-07-19 DIAGNOSIS — K589 Irritable bowel syndrome without diarrhea: Secondary | ICD-10-CM

## 2020-07-22 ENCOUNTER — Ambulatory Visit: Payer: Self-pay

## 2020-07-22 ENCOUNTER — Other Ambulatory Visit: Payer: Self-pay

## 2020-07-22 DIAGNOSIS — G4733 Obstructive sleep apnea (adult) (pediatric): Secondary | ICD-10-CM

## 2020-07-22 DIAGNOSIS — R400 Somnolence: Secondary | ICD-10-CM

## 2020-07-27 DIAGNOSIS — G4733 Obstructive sleep apnea (adult) (pediatric): Secondary | ICD-10-CM

## 2020-07-31 ENCOUNTER — Telehealth: Payer: Self-pay | Admitting: Adult Health

## 2020-07-31 DIAGNOSIS — R400 Somnolence: Secondary | ICD-10-CM

## 2020-07-31 DIAGNOSIS — G4733 Obstructive sleep apnea (adult) (pediatric): Secondary | ICD-10-CM

## 2020-07-31 NOTE — Telephone Encounter (Signed)
Home sleep study from 07/22/20 showed moderate obstructive sleep apnea with an AHI of 18.5 and SpO2 low of 61%.   Please let patient know that her home sleep study showed moderate sleep apnea and nocturnal hypoxemia Treatment options include weight loss and would recommend nocturnal CPAP if in agreement begin CPAP 5 to 15 cm H2O, mask of choice, enroll in [place].  Will need an office visit in 6 to 8 weeks for CPAP compliance checkup and CPAP download  If patient would like to discuss results and treatment options that is fine as well please make her an office visit to discuss

## 2020-07-31 NOTE — Telephone Encounter (Signed)
Spoke with pt. She is aware of results. Order has been placed for CPAP. Pt states that she will make follow up once she receives her CPAP. Nothing further was needed.

## 2020-09-16 ENCOUNTER — Ambulatory Visit: Payer: 59 | Admitting: Gastroenterology

## 2020-09-16 ENCOUNTER — Other Ambulatory Visit: Payer: Self-pay

## 2020-09-16 ENCOUNTER — Encounter: Payer: Self-pay | Admitting: Gastroenterology

## 2020-09-16 VITALS — BP 132/87 | HR 73 | Temp 97.2°F | Ht 67.0 in | Wt 300.2 lb

## 2020-09-16 DIAGNOSIS — D649 Anemia, unspecified: Secondary | ICD-10-CM | POA: Diagnosis not present

## 2020-09-16 DIAGNOSIS — R197 Diarrhea, unspecified: Secondary | ICD-10-CM | POA: Diagnosis not present

## 2020-09-16 NOTE — Progress Notes (Signed)
[name], [place] [address]  Main: [phone]  Fax: [phone]   Gastroenterology Consultation  Referring Provider:     Jinny Sanders, MD Primary Care Physician:  Jinny Sanders, MD Reason for Consultation:    Irritable bowel syndrome        HPI:    Chief Complaint  Patient presents with  . Irritable Bowel Syndrome    [name] is a 29 y.o. y/o female referred for consultation & management  by Dr. Diona Browner, Mervyn Gay, MD.  Patient reports 2 to 3-year history of abdominal discomfort accompanied with diarrhea, with a each meal.  Symptoms do not occur when she is not eating.  States no matter what she eats she has abdominal discomfort and has to go to the bathroom and bowel movements are usually soft to loose.  No blood in stool.  No nausea or vomiting.  No weight loss.  No prior EGD or colonoscopy.  No family history of colon cancer.  Very occasional ibuprofen use.  No prior work-up for this.  Has tried to avoid gluten and lactose without good results.  However, is continuing to eat gluten at this time.  Past Medical History:  Diagnosis Date  . Anxiety   . Depression   . History of MRSA infection   . Migraine   . Pilonidal cyst   . Polycystic ovaries     Past Surgical History:  Procedure Laterality Date  . INCISION AND DRAINAGE     x3 pilonidal cyst  . PILONIDAL CYST EXCISION N/A 02/14/2020   Procedure: CYST EXCISION PILONIDAL EXTENSIVE;  Surgeon: Olean Ree, MD;  Location: [facility] ORS;  Service: General;  Laterality: N/A;    Prior to Admission medications   Medication Sig Start Date End Date Taking? Authorizing Provider  ibuprofen (ADVIL) 800 MG tablet Take 1 tablet (800 mg total) by mouth every 8 (eight) hours as needed for mild pain or moderate pain. 02/20/20  Yes Tylene Fantasia, PA-C  cyclobenzaprine (FLEXERIL) 10 MG tablet Take 10 mg by mouth 3 (three) times daily as needed for muscle spasms. Patient not  taking: Reported on 09/16/2020    [provider]    Family History  Problem Relation Age of Onset  . Leukemia Mother 4  . Hypertension Father      Social History   Tobacco Use  . Smoking status: Never Smoker  . Smokeless tobacco: Never Used  Vaping Use  . Vaping Use: Never used  Substance Use Topics  . Alcohol use: Yes    Comment: seldom, less than one drink weekly  . Drug use: Yes    Types: Marijuana    Comment: rarely    Allergies as of 09/16/2020  . (No Known Allergies)    Review of Systems:    All systems reviewed and negative except where noted in HPI.   Physical Exam:  BP 132/87   Pulse 73   Temp (!) 97.2 F (36.2 C) (Oral)   Ht 5\' 7"  (1.702 m)   Wt (!) 300 lb 3.2 oz (136.2 kg)   BMI 47.02 kg/m  No LMP recorded. (Menstrual status: Irregular Periods). Psych:  Alert and cooperative. Normal mood and affect. General:   Alert,  Well-developed, well-nourished, pleasant and cooperative in NAD Head:  Normocephalic and atraumatic. Eyes:  Sclera clear, no icterus.   Conjunctiva pink. Ears:  Normal auditory acuity. Nose:  No deformity, discharge, or lesions. Mouth:  No  deformity or lesions,oropharynx pink & moist. Neck:  Supple; no masses or thyromegaly. Abdomen:  Normal bowel sounds.  No bruits.  Soft, non-tender and non-distended without masses, hepatosplenomegaly or hernias noted.  No guarding or rebound tenderness.    Msk:  Symmetrical without gross deformities. Good, equal movement & strength bilaterally. Pulses:  Normal pulses noted. Extremities:  No clubbing or edema.  No cyanosis. Neurologic:  Alert and oriented x3;  grossly normal neurologically. Skin:  Intact without significant lesions or rashes. No jaundice. Lymph Nodes:  No significant cervical adenopathy. Psych:  Alert and cooperative. Normal mood and affect.   Labs: CBC    Component Value Date/Time   WBC 10.9 (H) 01/19/2020 1624   RBC 4.37 01/19/2020 1624   HGB 10.6 (L) 01/19/2020  1624   HCT 34.3 (L) 01/19/2020 1624   PLT 359 01/19/2020 1624   MCV 78.5 (L) 01/19/2020 1624   MCH 24.3 (L) 01/19/2020 1624   MCHC 30.9 01/19/2020 1624   RDW 15.9 (H) 01/19/2020 1624   LYMPHSABS 1.6 01/19/2020 1624   MONOABS 1.1 (H) 01/19/2020 1624   EOSABS 0.1 01/19/2020 1624   BASOSABS 0.0 01/19/2020 1624   CMP     Component Value Date/Time   NA 139 04/15/2020 1103   NA 142 03/06/2019 0834   K 4.0 04/15/2020 1103   CL 106 04/15/2020 1103   CO2 26 04/15/2020 1103   GLUCOSE 82 04/15/2020 1103   BUN 7 04/15/2020 1103   BUN 8 03/06/2019 0834   CREATININE 0.77 04/15/2020 1103   CALCIUM 9.4 04/15/2020 1103   PROT 7.1 04/15/2020 1103   PROT 7.5 03/06/2019 0834   ALBUMIN 4.2 04/15/2020 1103   ALBUMIN 4.1 03/06/2019 0834   AST 13 04/15/2020 1103   ALT 13 04/15/2020 1103   ALKPHOS 116 04/15/2020 1103   BILITOT 0.4 04/15/2020 1103   BILITOT <0.2 03/06/2019 0834   GFRNONAA >60 01/19/2020 1624   GFRAA >60 01/19/2020 1624    Imaging Studies: No results found.  Assessment and Plan:   [name] is a 29 y.o. y/o female has been referred for irritable bowel syndrome  Patient has not had any specific work-up of her symptoms in the past  We will start with celiac disease panel Fecal calprotectin  Previous lab work does show microcytic anemia in January 2021.  We will repeat CBC and if still anemic, obtain ferritin and iron labs and if patient has iron deficiency anemia, would recommend EGD and colonoscopy  Obtain H. pylori breath test as well due to abdominal discomfort   Dr Sophia Antigua  Speech recognition software was used to dictate the above note.

## 2020-09-17 ENCOUNTER — Other Ambulatory Visit: Payer: Self-pay | Admitting: Gastroenterology

## 2020-09-17 LAB — CBC
Hematocrit: 37.5 % (ref 34.0–46.6)
Hemoglobin: 11.8 g/dL (ref 11.1–15.9)
MCH: 23.9 pg — ABNORMAL LOW (ref 26.6–33.0)
MCHC: 31.5 g/dL (ref 31.5–35.7)
MCV: 76 fL — ABNORMAL LOW (ref 79–97)
Platelets: 446 10*3/uL (ref 150–450)
RBC: 4.94 x10E6/uL (ref 3.77–5.28)
RDW: 14.8 % (ref 11.7–15.4)
WBC: 4.2 10*3/uL (ref 3.4–10.8)

## 2020-09-17 LAB — H. PYLORI BREATH TEST: H pylori Breath Test: NEGATIVE

## 2020-09-17 LAB — CELIAC AB TTG DGP TIGA
Antigliadin Abs, IgA: 7 units (ref 0–19)
Gliadin IgG: 16 units (ref 0–19)
IgA/Immunoglobulin A, Serum: 72 mg/dL — ABNORMAL LOW (ref 87–352)
Tissue Transglut Ab: 6 U/mL — ABNORMAL HIGH (ref 0–5)
Transglutaminase IgA: 9 U/mL — ABNORMAL HIGH (ref 0–3)

## 2020-09-18 LAB — CALPROTECTIN, FECAL: Calprotectin, Fecal: 59 ug/g (ref 0–120)

## 2020-09-19 ENCOUNTER — Telehealth: Payer: Self-pay

## 2020-09-19 ENCOUNTER — Other Ambulatory Visit: Payer: Self-pay

## 2020-09-19 DIAGNOSIS — R197 Diarrhea, unspecified: Secondary | ICD-10-CM

## 2020-09-19 DIAGNOSIS — R718 Other abnormality of red blood cells: Secondary | ICD-10-CM

## 2020-09-19 MED ORDER — NA SULFATE-K SULFATE-MG SULF 17.5-3.13-1.6 GM/177ML PO SOLN: ORAL | 0 refills | Status: DC

## 2020-09-19 NOTE — Telephone Encounter (Signed)
Error

## 2020-09-19 NOTE — Telephone Encounter (Signed)
-----   Message from Virgel Manifold, MD sent at 09/19/2020 11:18 AM EDT ----- Herb Grays please let the patient know, her fecal calprotectin was borderline positive. Her celiac testing shows that her total IgA levels are low as well. I recommend proceeding with EGD and Colonoscopy for diarrhea so we can evaluate for celiac disease and IBD. 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

## 2020-09-19 NOTE — Telephone Encounter (Signed)
Called patient to let her know about her lab results and what Dr. Michele Mcalpine recommendations were. Patient agreed on moving forward with scheduling her the EGD and colonoscopy and the referral to see Dr. Janese Banks for her microcytosis and IgA deficiency.

## 2020-09-23 ENCOUNTER — Other Ambulatory Visit: Admission: RE | Admit: 2020-09-23 | Payer: 59 | Source: Ambulatory Visit

## 2020-09-24 ENCOUNTER — Other Ambulatory Visit
Admission: RE | Admit: 2020-09-24 | Discharge: 2020-09-24 | Disposition: A | Discharge status: Home / Self Care | Payer: 59 | Source: Ambulatory Visit | Attending: Gastroenterology | Admitting: Gastroenterology

## 2020-09-24 ENCOUNTER — Other Ambulatory Visit: Payer: Self-pay

## 2020-09-24 DIAGNOSIS — Z20822 Contact with and (suspected) exposure to covid-19: Secondary | ICD-10-CM | POA: Diagnosis not present

## 2020-09-24 DIAGNOSIS — Z01812 Encounter for preprocedural laboratory examination: Secondary | ICD-10-CM | POA: Diagnosis not present

## 2020-09-24 LAB — SARS CORONAVIRUS 2 (TAT 6-24 HRS): SARS Coronavirus 2: NEGATIVE

## 2020-09-25 ENCOUNTER — Encounter
Admission: RE | Disposition: A | Discharge status: Home / Self Care | Payer: Self-pay | Source: Home / Self Care | Attending: Gastroenterology

## 2020-09-25 ENCOUNTER — Other Ambulatory Visit: Payer: Self-pay

## 2020-09-25 ENCOUNTER — Ambulatory Visit: Payer: 59 | Admitting: Certified Registered Nurse Anesthetist

## 2020-09-25 ENCOUNTER — Encounter: Payer: Self-pay | Admitting: Gastroenterology

## 2020-09-25 ENCOUNTER — Ambulatory Visit
Admission: RE | Admit: 2020-09-25 | Discharge: 2020-09-25 | Disposition: A | Discharge status: Home / Self Care | Payer: 59 | Attending: Gastroenterology | Admitting: Gastroenterology

## 2020-09-25 DIAGNOSIS — K2950 Unspecified chronic gastritis without bleeding: Secondary | ICD-10-CM | POA: Insufficient documentation

## 2020-09-25 DIAGNOSIS — Z8614 Personal history of Methicillin resistant Staphylococcus aureus infection: Secondary | ICD-10-CM | POA: Insufficient documentation

## 2020-09-25 DIAGNOSIS — Z6841 Body Mass Index (BMI) 40.0 and over, adult: Secondary | ICD-10-CM | POA: Diagnosis not present

## 2020-09-25 DIAGNOSIS — D175 Benign lipomatous neoplasm of intra-abdominal organs: Secondary | ICD-10-CM | POA: Insufficient documentation

## 2020-09-25 DIAGNOSIS — K3189 Other diseases of stomach and duodenum: Secondary | ICD-10-CM

## 2020-09-25 DIAGNOSIS — R197 Diarrhea, unspecified: Secondary | ICD-10-CM | POA: Diagnosis not present

## 2020-09-25 DIAGNOSIS — K5730 Diverticulosis of large intestine without perforation or abscess without bleeding: Secondary | ICD-10-CM | POA: Insufficient documentation

## 2020-09-25 DIAGNOSIS — Z791 Long term (current) use of non-steroidal anti-inflammatories (NSAID): Secondary | ICD-10-CM | POA: Diagnosis not present

## 2020-09-25 HISTORY — PX: ESOPHAGOGASTRODUODENOSCOPY (EGD) WITH PROPOFOL: SHX5813

## 2020-09-25 HISTORY — PX: COLONOSCOPY WITH PROPOFOL: SHX5780

## 2020-09-25 LAB — POCT PREGNANCY, URINE: Preg Test, Ur: NEGATIVE

## 2020-09-25 SURGERY — COLONOSCOPY WITH PROPOFOL
Anesthesia: General

## 2020-09-25 MED ORDER — MIDAZOLAM HCL 2 MG/2ML IJ SOLN
INTRAMUSCULAR | Status: DC | PRN
  Administered 2020-09-25: 2 mg via INTRAVENOUS

## 2020-09-25 MED ORDER — SODIUM CHLORIDE 0.9 % IV SOLN: INTRAVENOUS | Status: DC

## 2020-09-25 MED ORDER — PROPOFOL 500 MG/50ML IV EMUL
INTRAVENOUS | Status: DC | PRN
  Administered 2020-09-25: 140 ug/kg/min via INTRAVENOUS

## 2020-09-25 MED ORDER — PROPOFOL 500 MG/50ML IV EMUL
INTRAVENOUS | Status: AC
  Filled 2020-09-25: qty 50

## 2020-09-25 MED ORDER — PROPOFOL 10 MG/ML IV BOLUS
INTRAVENOUS | Status: DC | PRN
  Administered 2020-09-25: 27 mg via INTRAVENOUS
  Administered 2020-09-25: 90 mg via INTRAVENOUS

## 2020-09-25 MED ORDER — LIDOCAINE HCL (CARDIAC) PF 100 MG/5ML IV SOSY
PREFILLED_SYRINGE | INTRAVENOUS | Status: DC | PRN
  Administered 2020-09-25: 50 mg via INTRAVENOUS

## 2020-09-25 MED ORDER — MIDAZOLAM HCL 2 MG/2ML IJ SOLN
INTRAMUSCULAR | Status: AC
  Filled 2020-09-25: qty 2

## 2020-09-25 MED ORDER — PROPOFOL 10 MG/ML IV BOLUS
INTRAVENOUS | Status: AC
  Filled 2020-09-25: qty 20

## 2020-09-25 NOTE — Op Note (Signed)
[facility] Gastroenterology Patient Name: [name] Procedure Date: 09/25/2020 10:40 AM MRN: [id number] Account #: [id number] Date of Birth: 10/11/1991 Admit Type: Outpatient Age: 29 Room: [facility] ENDO ROOM 4 Gender: Female Note Status: Finalized Procedure:             Colonoscopy Indications:           Diarrhea Providers:             Tayra Dawe B. Bonna Gains MD, MD Medicines:             Monitored Anesthesia Care Complications:         No immediate complications. Procedure:             Pre-Anesthesia Assessment:                        - Prior to the procedure, a History and Physical was                         performed, and patient medications, allergies and                         sensitivities were reviewed. The patient's tolerance                         of previous anesthesia was reviewed.                        - The risks and benefits of the procedure and the                         sedation options and risks were discussed with the                         patient. All questions were answered and informed                         consent was obtained.                        - Patient identification and proposed procedure were                         verified prior to the procedure by the physician, the                         nurse, the anesthetist and the technician. The                         procedure was verified in the pre-procedure area in                         the procedure room in the endoscopy suite.                        - ASA Grade Assessment: II - A patient with mild                         systemic disease.                        -  After reviewing the risks and benefits, the patient                         was deemed in satisfactory condition to undergo the                         procedure.                        After obtaining informed consent, the colonoscope was                         passed under direct vision. Throughout the  procedure,                         the patient's blood pressure, pulse, and oxygen                         saturations were monitored continuously. The                         Colonoscope was introduced through the anus and                         advanced to the the cecum, identified by appendiceal                         orifice and ileocecal valve. The colonoscopy was                         performed with ease. The patient tolerated the                         procedure well. The quality of the bowel preparation                         was good. Findings:      The perianal and digital rectal examinations were normal.      There was a medium-sized lipoma, in the transverse colon. Biopsies were       taken with a cold forceps for histology.      Multiple diverticula were found in the sigmoid colon.      The exam was otherwise without abnormality.      The rectum, sigmoid colon, descending colon, transverse colon, ascending       colon, cecum and ileum appeared normal. Biopsies for histology were       taken with a cold forceps from the entire colon for evaluation of       microscopic colitis.      The retroflexed view of the distal rectum and anal verge was normal and       showed no anal or rectal abnormalities. Impression:            - Medium-sized lipoma in the transverse colon.                         Biopsied.                        - Diverticulosis in the sigmoid colon.                        -  The examination was otherwise normal.                        - The rectum, sigmoid colon, descending colon,                         transverse colon, ascending colon, cecum and terminal                         ileum are normal. Biopsied.                        - The distal rectum and anal verge are normal on                         retroflexion view. Recommendation:        - Discharge patient to home.                        - Resume previous diet.                        - Continue  present medications.                        - Return to primary care physician as previously                         scheduled.                        - The findings and recommendations were discussed with                         the patient.                        - The findings and recommendations were discussed with                         the patient's family.                        - High fiber diet.                        - Await pathology results. Procedure Code(s):     --- Professional ---                        [phone], Colonoscopy, flexible; with biopsy, single or                         multiple Diagnosis Code(s):     --- Professional ---                        D17.5, Benign lipomatous neoplasm of intra-abdominal                         organs                        R19.7, Diarrhea, unspecified CPT  copyright 2019 American Medical Association. All rights reserved. The codes documented in this report are preliminary and upon coder review may  be revised to meet current compliance requirements.  [name], MD Margretta Sidle B. Bonna Gains MD, MD 09/25/2020 11:23:11 AM This report has been signed electronically. Number of Addenda: 0 Note Initiated On: 09/25/2020 10:40 AM Scope Withdrawal Time: 0 hours 13 minutes 18 seconds  Total Procedure Duration: 0 hours 14 minutes 27 seconds  Estimated Blood Loss:  Estimated blood loss: none.      [facility]

## 2020-09-25 NOTE — Anesthesia Preprocedure Evaluation (Addendum)
Anesthesia Evaluation  Patient identified by MRN, date of birth, ID band Patient awake    Reviewed: Allergy & Precautions, H&P , NPO status , Patient's Chart, lab work & pertinent test results, reviewed documented beta blocker date and time   Airway Mallampati: II   Neck ROM: full    Dental  (+) Poor Dentition   Pulmonary neg pulmonary ROS,    Pulmonary exam normal        Cardiovascular Exercise Tolerance: Good negative cardio ROS Normal cardiovascular exam Rhythm:regular Rate:Normal     Neuro/Psych  Headaches, negative psych ROS   GI/Hepatic negative GI ROS, Neg liver ROS,   Endo/Other  Morbid obesity  Renal/GU negative Renal ROS  negative genitourinary   Musculoskeletal   Abdominal   Peds  Hematology negative hematology ROS (+)   Anesthesia Other Findings Past Medical History: No date: Anxiety No date: Depression 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 No date: Migraine No date: Pilonidal cyst No date: Polycystic ovaries Past Surgical History: No date: INCISION AND DRAINAGE     Comment:  x3 pilonidal cyst 02/14/2020: PILONIDAL CYST EXCISION; N/A     Comment:  Procedure: CYST EXCISION PILONIDAL EXTENSIVE;  Surgeon:               Olean Ree, MD;  Location: [facility] ORS;  Service:               General;  Laterality: N/A; BMI    Body Mass Index: 46.99 kg/m     Reproductive/Obstetrics negative OB ROS                             Anesthesia Physical Anesthesia Plan  ASA: III  Anesthesia Plan: General   Post-op Pain Management:    Induction:   PONV Risk Score and Plan:   Airway Management Planned:   Additional Equipment:   Intra-op Plan:   Post-operative Plan:   Informed Consent: I have reviewed the patients History and Physical, chart, labs and discussed the procedure including the risks, benefits and alternatives for the proposed anesthesia with the patient or authorized  representative who has indicated his/her understanding and acceptance.     Dental Advisory Given  Plan Discussed with: CRNA  Anesthesia Plan Comments:         Anesthesia Quick Evaluation

## 2020-09-25 NOTE — Transfer of Care (Signed)
Immediate Anesthesia Transfer of Care Note  Patient: [name]  Procedure(s) Performed: COLONOSCOPY WITH PROPOFOL (N/A ) ESOPHAGOGASTRODUODENOSCOPY (EGD) WITH PROPOFOL (N/A )  Patient Location: PACU and Endoscopy Unit  Anesthesia Type:General  Level of Consciousness: drowsy  Airway & Oxygen Therapy: Patient Spontanous Breathing  Post-op Assessment: Report given to RN and Post -op Vital signs reviewed and stable  Post vital signs: Reviewed and stable  Last Vitals:  Vitals Value Taken Time  BP 131/78 (93) 09/25/20 1123  Temp    Pulse 104 09/25/20 1123  Resp 22 09/25/20 1123  SpO2 100 % 09/25/20 1123  Vitals shown include unvalidated device data.  Last Pain:  Vitals:   09/25/20 1006  TempSrc: Temporal  PainSc: 0-No pain         Complications: No complications documented.

## 2020-09-25 NOTE — Op Note (Addendum)
[facility] Gastroenterology Patient Name: [name] Procedure Date: 09/25/2020 10:41 AM MRN: [id number] Account #: [id number] Date of Birth: 13-Apr-1991 Admit Type: Outpatient Age: 29 Room: [facility] ENDO ROOM 4 Gender: Female Note Status: Finalized Procedure:             Upper GI endoscopy Indications:           Diarrhea Providers:             Emmalee Solivan B. Bonna Gains MD, MD Medicines:             Monitored Anesthesia Care Complications:         No immediate complications. Procedure:             Pre-Anesthesia Assessment:                        - Prior to the procedure, a History and Physical was                         performed, and patient medications, allergies and                         sensitivities were reviewed. The patient's tolerance                         of previous anesthesia was reviewed.                        - The risks and benefits of the procedure and the                         sedation options and risks were discussed with the                         patient. All questions were answered and informed                         consent was obtained.                        - Patient identification and proposed procedure were                         verified prior to the procedure by the physician, the                         nurse, the anesthesiologist, the anesthetist and the                         technician. The procedure was verified in the                         procedure room.                        - ASA Grade Assessment: II - A patient with mild                         systemic disease.  After obtaining informed consent, the endoscope was                         passed under direct vision. Throughout the procedure,                         the patient's blood pressure, pulse, and oxygen                         saturations were monitored continuously. The Endoscope                         was introduced through the  mouth, and advanced to the                         third part of duodenum. The upper GI endoscopy was                         accomplished with ease. The patient tolerated the                         procedure well. Findings:      The examined esophagus was normal.      Patchy mildly erythematous mucosa without bleeding was found in the       gastric antrum. Biopsies were taken with a cold forceps for histology.       Biopsies were obtained in the gastric body, at the incisura and in the       gastric antrum with cold forceps for histology.      The exam of the stomach was otherwise normal.      The duodenal bulb, second portion of the duodenum and examined duodenum       were normal. Biopsies were obtained in the duodenal bulb, in the second       portion of the duodenum and in the third portion of the duodenum with       cold forceps for evaluation of celiac disease. Impression:            - Normal esophagus.                        - Erythematous mucosa in the antrum. Biopsied.                        - Normal duodenal bulb, second portion of the duodenum                         and examined duodenum.                        - Biopsies were obtained in the gastric body, at the                         incisura and in the gastric antrum.                        - Biopsies were obtained in the duodenal bulb, in the  second portion of the duodenum and in the third                         portion of the duodenum. Recommendation:        - Await pathology results.                        - Discharge patient to home (with escort).                        - Advance diet as tolerated.                        - Continue present medications.                        - Patient has a contact number available for                         emergencies. The signs and symptoms of potential                         delayed complications were discussed with the patient.                          Return to normal activities tomorrow. Written                         discharge instructions were provided to the patient.                        - Discharge patient to home (with escort).                        - The findings and recommendations were discussed with                         the patient.                        - The findings and recommendations were discussed with                         the patient's family. Procedure Code(s):     --- Professional ---                        [phone], Esophagogastroduodenoscopy, flexible,                         transoral; with biopsy, single or multiple Diagnosis Code(s):     --- Professional ---                        K31.89, Other diseases of stomach and duodenum                        R19.7, Diarrhea, unspecified CPT copyright 2019 American Medical Association. All rights reserved. The codes documented in this report are preliminary and upon coder review may  be revised to meet current compliance requirements.  [name], MD Margretta Sidle B.  Bonna Gains MD, MD 09/25/2020 11:01:14 AM This report has been signed electronically. Number of Addenda: 0 Note Initiated On: 09/25/2020 10:41 AM Estimated Blood Loss:  Estimated blood loss: none.      [facility]
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
## 2020-09-25 NOTE — Anesthesia Postprocedure Evaluation (Signed)
Anesthesia Post Note  Patient: [name]  Procedure(s) Performed: COLONOSCOPY WITH PROPOFOL (N/A ) ESOPHAGOGASTRODUODENOSCOPY (EGD) WITH PROPOFOL (N/A )  Patient location during evaluation: Endoscopy Anesthesia Type: General Level of consciousness: awake and alert and oriented Pain management: pain level controlled Vital Signs Assessment: post-procedure vital signs reviewed and stable Respiratory status: spontaneous breathing Cardiovascular status: blood pressure returned to baseline Anesthetic complications: no   No complications documented.   Last Vitals:  Vitals:   09/25/20 1132 09/25/20 1142  BP: (!) 125/95 136/88  Pulse: 88 80  Resp: (!) 23 18  Temp:    SpO2: 100% 99%    Last Pain:  Vitals:   09/25/20 1142  TempSrc:   PainSc: 0-No pain                 Zyree Traynham

## 2020-09-26 ENCOUNTER — Encounter: Payer: Self-pay | Admitting: Gastroenterology

## 2020-09-26 LAB — SURGICAL PATHOLOGY

## 2020-09-27 ENCOUNTER — Encounter: Payer: Self-pay | Admitting: Oncology

## 2020-09-27 ENCOUNTER — Other Ambulatory Visit: Payer: Self-pay

## 2020-09-27 ENCOUNTER — Inpatient Hospital Stay: Payer: 59

## 2020-09-27 ENCOUNTER — Inpatient Hospital Stay: Payer: 59 | Attending: Oncology | Admitting: Oncology

## 2020-09-27 VITALS — BP 106/72 | HR 93 | Temp 98.6°F | Resp 18 | Ht 67.0 in | Wt 301.5 lb

## 2020-09-27 DIAGNOSIS — Z8614 Personal history of Methicillin resistant Staphylococcus aureus infection: Secondary | ICD-10-CM | POA: Insufficient documentation

## 2020-09-27 DIAGNOSIS — D509 Iron deficiency anemia, unspecified: Secondary | ICD-10-CM

## 2020-09-27 DIAGNOSIS — R197 Diarrhea, unspecified: Secondary | ICD-10-CM | POA: Diagnosis not present

## 2020-09-27 DIAGNOSIS — F418 Other specified anxiety disorders: Secondary | ICD-10-CM | POA: Diagnosis not present

## 2020-09-27 DIAGNOSIS — F321 Major depressive disorder, single episode, moderate: Secondary | ICD-10-CM | POA: Insufficient documentation

## 2020-09-27 LAB — IRON AND TIBC
Iron: 28 ug/dL (ref 28–170)
Saturation Ratios: 8 % — ABNORMAL LOW (ref 10.4–31.8)
TIBC: 368 ug/dL (ref 250–450)
UIBC: 340 ug/dL

## 2020-09-27 LAB — CBC WITH DIFFERENTIAL/PLATELET
Abs Immature Granulocytes: 0.02 10*3/uL (ref 0.00–0.07)
Basophils Absolute: 0 10*3/uL (ref 0.0–0.1)
Basophils Relative: 1 %
Eosinophils Absolute: 0.1 10*3/uL (ref 0.0–0.5)
Eosinophils Relative: 2 %
HCT: 34.8 % — ABNORMAL LOW (ref 36.0–46.0)
Hemoglobin: 11.4 g/dL — ABNORMAL LOW (ref 12.0–15.0)
Immature Granulocytes: 0 %
Lymphocytes Relative: 31 %
Lymphs Abs: 1.7 10*3/uL (ref 0.7–4.0)
MCH: 24.9 pg — ABNORMAL LOW (ref 26.0–34.0)
MCHC: 32.8 g/dL (ref 30.0–36.0)
MCV: 76 fL — ABNORMAL LOW (ref 80.0–100.0)
Monocytes Absolute: 0.5 10*3/uL (ref 0.1–1.0)
Monocytes Relative: 9 %
Neutro Abs: 3.3 10*3/uL (ref 1.7–7.7)
Neutrophils Relative %: 57 %
Platelets: 410 10*3/uL — ABNORMAL HIGH (ref 150–400)
RBC: 4.58 MIL/uL (ref 3.87–5.11)
RDW: 15.3 % (ref 11.5–15.5)
WBC: 5.6 10*3/uL (ref 4.0–10.5)
nRBC: 0 % (ref 0.0–0.2)

## 2020-09-27 LAB — VITAMIN B12: Vitamin B-12: 238 pg/mL (ref 180–914)

## 2020-09-27 LAB — FERRITIN: Ferritin: 49 ng/mL (ref 11–307)

## 2020-09-27 NOTE — Progress Notes (Signed)
No c/o noted today

## 2020-10-02 ENCOUNTER — Encounter: Payer: Self-pay | Admitting: Oncology

## 2020-10-02 NOTE — Progress Notes (Signed)
Hematology/Oncology Consult note [facility] [phone] [phone]  Patient Care Team: Jinny Sanders, MD as PCP - General   Name of the patient: [name]  [id number]  Dec 16, 1991    Reason for referral-microcytosis   Referring physician-Dr. Bonna Gains  Date of visit: 10/02/20   History of presenting illness- Patient is a 29 year old African-American female who was seen by Dr. Bonna Gains for symptoms of abdominal discomfort and diarrhea.  She recently underwent EGD and colonoscopy which did not show any evidence of bleeding.  Colonoscopy showed diverticulosis of the sigmoid colon and medium-sized lipoma in the transverse colon.  Upper endoscopy showed normal esophagus and erythematous mucosa in the antrum that was biopsied and was negative for malignancy.  Patient had a CBC done as a part of the work-up which showed her hemoglobin was 11.8/27.5 with an MCV of 76.  She has therefore been referred to [place].  White count and platelets are normal.  Patient reports that her menstrual cycles are otherwise regular.  Denies any family history of colon cancer.  Denies any consistent use of NSAIDs  ECOG PS- 0  Pain scale- 0   Review of systems- Review of Systems  Constitutional: Negative for chills, fever, malaise/fatigue and weight loss.  HENT: Negative for congestion, ear discharge and nosebleeds.   Eyes: Negative for blurred vision.  Respiratory: Negative for cough, hemoptysis, sputum production, shortness of breath and wheezing.   Cardiovascular: Negative for chest pain, palpitations, orthopnea and claudication.  Gastrointestinal: Negative for abdominal pain, blood in stool, constipation, diarrhea, heartburn, melena, nausea and vomiting.  Genitourinary: Negative for dysuria, flank pain, frequency, hematuria and urgency.  Musculoskeletal: Negative for back pain, joint pain and myalgias.  Skin: Negative for rash.  Neurological: Negative for  dizziness, tingling, focal weakness, seizures, weakness and headaches.  Endo/Heme/Allergies: Does not bruise/bleed easily.  Psychiatric/Behavioral: Negative for depression and suicidal ideas. The patient does not have insomnia.     No Known Allergies  Patient Active Problem List   Diagnosis Date Noted  . Diarrhea   . Stomach irritation   . Lipoma of colon   . Daytime sleepiness 04/09/2020  . Difficulty concentrating 02/29/2020  . Elevated BP without diagnosis of hypertension 02/29/2020  . Snoring 02/29/2020  . Fever 02/10/2019  . Pilonidal cyst 09/13/2018  . IBS (irritable bowel syndrome) 01/28/2018  . Laryngitis 12/31/2017  . First degree ankle sprain, left, subsequent encounter 10/26/2017  . Depression, major, single episode, moderate ([place]) 07/23/2017  . Chronic upper back pain 06/11/2016  . Morbid obesity ([place]) 06/26/2014  . Encounter for counseling regarding contraception 06/16/2013  . NONSPEC REACT TUBERCULIN SKN TEST W/O ACTV TB 10/21/2009  . POLYCYSTIC OVARIAN DISEASE 04/15/2009  . ALLERGIC RHINITIS 04/15/2009  . MIGRAINES, HX OF 04/15/2009     Past Medical History:  Diagnosis Date  . Anxiety   . Depression   . History of MRSA infection   . Migraine   . Pilonidal cyst   . Polycystic ovaries      Past Surgical History:  Procedure Laterality Date  . COLONOSCOPY WITH PROPOFOL N/A 09/25/2020   Procedure: COLONOSCOPY WITH PROPOFOL;  Surgeon: Virgel Manifold, MD;  Location: [facility] ENDOSCOPY;  Service: Endoscopy;  Laterality: N/A;  . ESOPHAGOGASTRODUODENOSCOPY (EGD) WITH PROPOFOL N/A 09/25/2020   Procedure: ESOPHAGOGASTRODUODENOSCOPY (EGD) WITH PROPOFOL;  Surgeon: Virgel Manifold, MD;  Location: [facility] ENDOSCOPY;  Service: Endoscopy;  Laterality: N/A;  . INCISION AND DRAINAGE     x3 pilonidal cyst  . PILONIDAL CYST  EXCISION N/A 02/14/2020   Procedure: CYST EXCISION PILONIDAL EXTENSIVE;  Surgeon: Olean Ree, MD;  Location: [facility] ORS;  Service: General;   Laterality: N/A;    Social History   Socioeconomic History  . Marital status: Single    Spouse name: Not on file  . Number of children: 0  . Years of education: Not on file  . Highest education level: Not on file  Occupational History  . Occupation: Microbiology lab    Employer: [employer]  Tobacco Use  . Smoking status: Never Smoker  . Smokeless tobacco: Never Used  Vaping Use  . Vaping Use: Never used  Substance and Sexual Activity  . Alcohol use: Yes    Comment: seldom, less than one drink weekly  . Drug use: Yes    Types: Marijuana    Comment: rarely  . Sexual activity: Not Currently  Other Topics Concern  . Not on file  Social History Narrative   REGULAR EXERCISE-NO   Healthy eating.   HAS EXPERIENCED PHYSICAL ABUSE   SLEEPS 7-9 HOURS PER NIGHT   5 PEOPLE LIVING IN THE RESIDENCE   Social Determinants of Health   Financial Resource Strain:   . Difficulty of Paying Living Expenses: Not on file  Food Insecurity:   . Worried About Charity fundraiser in the Last Year: Not on file  . Ran Out of Food in the Last Year: Not on file  Transportation Needs:   . Lack of Transportation (Medical): Not on file  . Lack of Transportation (Non-Medical): Not on file  Physical Activity:   . Days of Exercise per Week: Not on file  . Minutes of Exercise per Session: Not on file  Stress:   . Feeling of Stress : Not on file  Social Connections:   . Frequency of Communication with Friends and Family: Not on file  . Frequency of Social Gatherings with Friends and Family: Not on file  . Attends Religious Services: Not on file  . Active Member of Clubs or Organizations: Not on file  . Attends Archivist Meetings: Not on file  . Marital Status: Not on file  Intimate Partner Violence:   . Fear of Current or Ex-Partner: Not on file  . Emotionally Abused: Not on file  . Physically Abused: Not on file  . Sexually Abused: Not on file     Family History  Problem Relation Age  of Onset  . Leukemia Mother 80  . Hypertension Father      Current Outpatient Medications:  .  acetaminophen (TYLENOL) 500 MG tablet, Take 500 mg by mouth every 6 (six) hours as needed., Disp: , Rfl:  .  ibuprofen (ADVIL) 800 MG tablet, Take 1 tablet (800 mg total) by mouth every 8 (eight) hours as needed for mild pain or moderate pain., Disp: 30 tablet, Rfl: 1 .  cyclobenzaprine (FLEXERIL) 10 MG tablet, Take 10 mg by mouth 3 (three) times daily as needed for muscle spasms. (Patient not taking: Reported on 09/16/2020), Disp: , Rfl:  .  Na Sulfate-K Sulfate-Mg Sulf 17.5-3.13-1.6 GM/177ML SOLN, At 5 PM the day before procedure take 1 bottle and 5 hours before procedure take 1 bottle., Disp: 354 mL, Rfl: 0   Physical exam:  Vitals:   09/27/20 1054  BP: 106/72  Pulse: 93  Resp: 18  Temp: 98.6 F (37 C)  TempSrc: Tympanic  SpO2: 98%  Weight: (!) 301 lb 8 oz (136.8 kg)  Height: 5\' 7"  (1.702 m)   Physical  Exam Constitutional:      General: She is not in acute distress. Cardiovascular:     Rate and Rhythm: Normal rate and regular rhythm.     Heart sounds: Normal heart sounds.  Pulmonary:     Effort: Pulmonary effort is normal.     Breath sounds: Normal breath sounds.  Abdominal:     General: Bowel sounds are normal.     Palpations: Abdomen is soft.  Skin:    General: Skin is warm and dry.  Neurological:     Mental Status: She is alert and oriented to person, place, and time.        CMP Latest Ref Rng & Units 04/15/2020  Glucose 70 - 99 mg/dL 82  BUN 6 - 23 mg/dL 7  Creatinine 0.40 - 1.20 mg/dL 0.77  Sodium 135 - 145 mEq/L 139  Potassium 3.5 - 5.1 mEq/L 4.0  Chloride 96 - 112 mEq/L 106  CO2 19 - 32 mEq/L 26  Calcium 8.4 - 10.5 mg/dL 9.4  Total Protein 6.0 - 8.3 g/dL 7.1  Total Bilirubin 0.2 - 1.2 mg/dL 0.4  Alkaline Phos 39 - 117 U/L 116  AST 0 - 37 U/L 13  ALT 0 - 35 U/L 13   CBC Latest Ref Rng & Units 09/27/2020  WBC 4.0 - 10.5 K/uL 5.6  Hemoglobin 12.0 - 15.0  g/dL 11.4(L)  Hematocrit 36 - 46 % 34.8(L)  Platelets 150 - 400 K/uL 410(H)     Assessment and plan- Patient is a 29 y.o. female referred for mild microcytic anemia  Today I will check a CBC with differential, ferritin and iron studies, B12 and folate.  This will tell us of the patient's microcytosis is because of iron deficiency or if underlying hemoglobinopathy needs to be ruled out.  I will see the patient in 1 to 2 weeks to discuss the results of her blood work for a video visit   Thank you for this kind referral and the opportunity to participate in the care of this  Patient   Visit Diagnosis 1. Microcytic anemia     Dr. Randa Evens, MD, MPH [facility] [phone] 10/02/2020

## 2020-10-03 ENCOUNTER — Inpatient Hospital Stay (HOSPITAL_BASED_OUTPATIENT_CLINIC_OR_DEPARTMENT_OTHER): Payer: 59 | Admitting: Oncology

## 2020-10-03 ENCOUNTER — Other Ambulatory Visit: Payer: Self-pay

## 2020-10-03 ENCOUNTER — Encounter: Payer: Self-pay | Admitting: Oncology

## 2020-10-03 DIAGNOSIS — E538 Deficiency of other specified B group vitamins: Secondary | ICD-10-CM | POA: Diagnosis not present

## 2020-10-03 DIAGNOSIS — D509 Iron deficiency anemia, unspecified: Secondary | ICD-10-CM | POA: Diagnosis not present

## 2020-10-03 NOTE — Progress Notes (Signed)
Video visit to get results

## 2020-10-06 NOTE — Progress Notes (Signed)
I connected with [name] on 10/06/20 at  2:30 PM EDT by video enabled telemedicine visit and verified that I am speaking with the correct person using two identifiers.   I discussed the limitations, risks, security and privacy concerns of performing an evaluation and management service by telemedicine and the availability of in-person appointments. I also discussed with the patient that there may be a patient responsible charge related to this service. The patient expressed understanding and agreed to proceed.  Other persons participating in the visit and their role in the encounter:  none  Patient's location:  home Provider's location:  work  Risk analyst Complaint: Discuss results of blood work  History of present illness: Patient is a 29 year old African-American female who was seen by Dr. Bonna Gains for symptoms of abdominal discomfort and diarrhea.  She recently underwent EGD and colonoscopy which did not show any evidence of bleeding.  Colonoscopy showed diverticulosis of the sigmoid colon and medium-sized lipoma in the transverse colon.  Upper endoscopy showed normal esophagus and erythematous mucosa in the antrum that was biopsied and was negative for malignancy.  Patient had a CBC done as a part of the work-up which showed her hemoglobin was 11.8/27.5 with an MCV of 76.  She has therefore been referred to [place].  White count and platelets are normal.  Patient reports that her menstrual cycles are otherwise regular.  Denies any family history of colon cancer.  Denies any consistent use of NSAIDs  Results of blood work from 09/27/2020 showed H&H of 11.4/34.8 with an MCV of 76 and a platelet count of 410.  Ferritin was normal at 49 but iron saturation was low at 8%.  B12 levels were low at 238.   Interval history patient reports mild fatigue but denies other complaints   Review of Systems  Constitutional: Positive for malaise/fatigue. Negative for chills, fever and weight loss.  HENT: Negative  for congestion, ear discharge and nosebleeds.   Eyes: Negative for blurred vision.  Respiratory: Negative for cough, hemoptysis, sputum production, shortness of breath and wheezing.   Cardiovascular: Negative for chest pain, palpitations, orthopnea and claudication.  Gastrointestinal: Negative for abdominal pain, blood in stool, constipation, diarrhea, heartburn, melena, nausea and vomiting.  Genitourinary: Negative for dysuria, flank pain, frequency, hematuria and urgency.  Musculoskeletal: Negative for back pain, joint pain and myalgias.  Skin: Negative for rash.  Neurological: Negative for dizziness, tingling, focal weakness, seizures, weakness and headaches.  Endo/Heme/Allergies: Does not bruise/bleed easily.  Psychiatric/Behavioral: Negative for depression and suicidal ideas. The patient does not have insomnia.     No Known Allergies  Past Medical History:  Diagnosis Date  . Anxiety   . Depression   . History of MRSA infection   . Migraine   . Pilonidal cyst   . Polycystic ovaries     Past Surgical History:  Procedure Laterality Date  . COLONOSCOPY WITH PROPOFOL N/A 09/25/2020   Procedure: COLONOSCOPY WITH PROPOFOL;  Surgeon: Virgel Manifold, MD;  Location: [facility] ENDOSCOPY;  Service: Endoscopy;  Laterality: N/A;  . ESOPHAGOGASTRODUODENOSCOPY (EGD) WITH PROPOFOL N/A 09/25/2020   Procedure: ESOPHAGOGASTRODUODENOSCOPY (EGD) WITH PROPOFOL;  Surgeon: Virgel Manifold, MD;  Location: [facility] ENDOSCOPY;  Service: Endoscopy;  Laterality: N/A;  . INCISION AND DRAINAGE     x3 pilonidal cyst  . PILONIDAL CYST EXCISION N/A 02/14/2020   Procedure: CYST EXCISION PILONIDAL EXTENSIVE;  Surgeon: Olean Ree, MD;  Location: [facility] ORS;  Service: General;  Laterality: N/A;    Social History   Socioeconomic History  .  Marital status: Single    Spouse name: Not on file  . Number of children: 0  . Years of education: Not on file  . Highest education level: Not on file  Occupational  History  . Occupation: Microbiology lab    Employer: [employer]  Tobacco Use  . Smoking status: Never Smoker  . Smokeless tobacco: Never Used  Vaping Use  . Vaping Use: Never used  Substance and Sexual Activity  . Alcohol use: Yes    Comment: seldom, less than one drink weekly  . Drug use: Yes    Types: Marijuana    Comment: rarely  . Sexual activity: Not Currently  Other Topics Concern  . Not on file  Social History Narrative   REGULAR EXERCISE-NO   Healthy eating.   HAS EXPERIENCED PHYSICAL ABUSE   SLEEPS 7-9 HOURS PER NIGHT   5 PEOPLE LIVING IN THE RESIDENCE   Social Determinants of Health   Financial Resource Strain:   . Difficulty of Paying Living Expenses: Not on file  Food Insecurity:   . Worried About Charity fundraiser in the Last Year: Not on file  . Ran Out of Food in the Last Year: Not on file  Transportation Needs:   . Lack of Transportation (Medical): Not on file  . Lack of Transportation (Non-Medical): Not on file  Physical Activity:   . Days of Exercise per Week: Not on file  . Minutes of Exercise per Session: Not on file  Stress:   . Feeling of Stress : Not on file  Social Connections:   . Frequency of Communication with Friends and Family: Not on file  . Frequency of Social Gatherings with Friends and Family: Not on file  . Attends Religious Services: Not on file  . Active Member of Clubs or Organizations: Not on file  . Attends Archivist Meetings: Not on file  . Marital Status: Not on file  Intimate Partner Violence:   . Fear of Current or Ex-Partner: Not on file  . Emotionally Abused: Not on file  . Physically Abused: Not on file  . Sexually Abused: Not on file    Family History  Problem Relation Age of Onset  . Leukemia Mother 36  . Hypertension Father      Current Outpatient Medications:  .  acetaminophen (TYLENOL) 500 MG tablet, Take 500 mg by mouth every 6 (six) hours as needed., Disp: , Rfl:  .  cyclobenzaprine (FLEXERIL)  10 MG tablet, Take 10 mg by mouth 3 (three) times daily as needed for muscle spasms. , Disp: , Rfl:  .  ibuprofen (ADVIL) 800 MG tablet, Take 1 tablet (800 mg total) by mouth every 8 (eight) hours as needed for mild pain or moderate pain., Disp: 30 tablet, Rfl: 1  No results found.  No images are attached to the encounter.   CMP Latest Ref Rng & Units 04/15/2020  Glucose 70 - 99 mg/dL 82  BUN 6 - 23 mg/dL 7  Creatinine 0.40 - 1.20 mg/dL 0.77  Sodium 135 - 145 mEq/L 139  Potassium 3.5 - 5.1 mEq/L 4.0  Chloride 96 - 112 mEq/L 106  CO2 19 - 32 mEq/L 26  Calcium 8.4 - 10.5 mg/dL 9.4  Total Protein 6.0 - 8.3 g/dL 7.1  Total Bilirubin 0.2 - 1.2 mg/dL 0.4  Alkaline Phos 39 - 117 U/L 116  AST 0 - 37 U/L 13  ALT 0 - 35 U/L 13   CBC Latest Ref Rng & Units  09/27/2020  WBC 4.0 - 10.5 K/uL 5.6  Hemoglobin 12.0 - 15.0 g/dL 11.4(L)  Hematocrit 36 - 46 % 34.8(L)  Platelets 150 - 400 K/uL 410(H)     Observation/objective: Appears in no acute distress over video visit today.  Breathing is nonlabored  Assessment and plan:Patient is a 29 year old female referred for microcytic anemia secondary to mild iron deficiency.  This is a visit to discuss results of blood work  I discussed the results of the blood work with the patient which is consistent with mild iron deficiency with a hemoglobin of 11.4.  Her iron saturation is low at 8% And ferritin levels are low normal at 49.  Also B12 levels were low at 238.  I recommend that she should take oral iron 325 mg once or twice a day as tolerated along with oral B12 1000 mcg daily.  I will repeat CBC ferritin and iron studies and B12 in 4 months and if iron levels are still low at that time we can consider giving her IV iron  Follow-up instructions: As above I discussed the assessment and treatment plan with the patient. The patient was provided an opportunity to ask questions and all were answered. The patient agreed with the plan and demonstrated an  understanding of the instructions.   The patient was advised to call back or seek an in-person evaluation if the symptoms worsen or if the condition fails to improve as anticipated.  Visit Diagnosis: 1. Microcytic anemia   2. Iron deficiency anemia, unspecified iron deficiency anemia type   3. B12 deficiency     Dr. Randa Evens, MD, MPH [facility] Tel- [phone] 10/06/2020 3:02 PM

## 2020-10-24 ENCOUNTER — Telehealth: Payer: Self-pay

## 2020-10-24 NOTE — Telephone Encounter (Signed)
Called patient to let her know what her pathology results and recommendations were per Dr. Bonna Gains.  I told her that I would be sending her information about gluten free diet and celiac disease and her follow up appointment. I also told patient that I would be putting her on a recall list to repeat her EGD within 6-12 months. Patient agreed and understood.

## 2020-10-24 NOTE — Telephone Encounter (Signed)
-----   Message from Virgel Manifold, MD sent at 10/22/2020 12:03 PM EDT ----- Herb Grays please let the patient know, her blood work and biopsy results suggest that she has celiac disease.  This is the cause for her diarrhea.  She should avoid products with gluten.  Please send handout for celiac disease also.  She also has changes in her stomach tissue called intestinal metaplasia that would benefit from repeat biopsies in 6 to 12 months.  She should follow-up with me in clinic in 2 to 3 months to reassess symptoms on gluten-free diet and further management

## 2020-12-25 ENCOUNTER — Other Ambulatory Visit: Payer: Self-pay | Admitting: Gastroenterology

## 2020-12-25 ENCOUNTER — Encounter: Payer: Self-pay | Admitting: Gastroenterology

## 2020-12-25 ENCOUNTER — Ambulatory Visit (INDEPENDENT_AMBULATORY_CARE_PROVIDER_SITE_OTHER): Payer: 59 | Admitting: Gastroenterology

## 2020-12-25 VITALS — BP 131/86 | HR 75 | Ht 67.0 in | Wt 293.0 lb

## 2020-12-25 DIAGNOSIS — K900 Celiac disease: Secondary | ICD-10-CM

## 2020-12-25 DIAGNOSIS — D509 Iron deficiency anemia, unspecified: Secondary | ICD-10-CM

## 2020-12-25 NOTE — Progress Notes (Signed)
[name], MD [address]  [place]  [place], [place] [address]  Main: [phone]  Fax: [phone]   Primary Care Physician: Jinny Sanders, MD   Chief Complaint  Patient presents with  . Follow-up    Celiac disease    HPI: [name] is a 29 y.o. female here for follow-up of celiac disease.  Patient has been following a gluten-free diet since her pathology showed celiac disease.  States she has noticed an improvement in her bowel movements and they are more formed.  Does have symptoms when she consumes lactose products.  No blood in stool.  States she takes vitamin B12 but does not know of any other vitamin deficiencies.  Current Outpatient Medications  Medication Sig Dispense Refill  . desvenlafaxine (PRISTIQ) 50 MG 24 hr tablet Take 50 mg by mouth daily.    . vitamin B-12 (CYANOCOBALAMIN) 500 MCG tablet Take 500 mcg by mouth daily.    Marland Kitchen acetaminophen (TYLENOL) 500 MG tablet Take 500 mg by mouth every 6 (six) hours as needed. (Patient not taking: Reported on 12/25/2020)    . cyclobenzaprine (FLEXERIL) 10 MG tablet Take 10 mg by mouth 3 (three) times daily as needed for muscle spasms.  (Patient not taking: Reported on 12/25/2020)    . ibuprofen (ADVIL) 800 MG tablet Take 1 tablet (800 mg total) by mouth every 8 (eight) hours as needed for mild pain or moderate pain. (Patient not taking: Reported on 12/25/2020) 30 tablet 1   No current facility-administered medications for this visit.    Allergies as of 12/25/2020  . (No Known Allergies)    ROS:  General: Negative for anorexia, weight loss, fever, chills, fatigue, weakness. ENT: Negative for hoarseness, difficulty swallowing , nasal congestion. CV: Negative for chest pain, angina, palpitations, dyspnea on exertion, peripheral edema.  Respiratory: Negative for dyspnea at rest, dyspnea on exertion, cough, sputum, wheezing.  GI: See history of present illness. GU:  Negative for dysuria, hematuria,  urinary incontinence, urinary frequency, nocturnal urination.  Endo: Negative for unusual weight change.    Physical Examination:   BP 131/86 (BP Location: Right Arm, Patient Position: Sitting, Cuff Size: Large)   Pulse 75   Ht 5\' 7"  (1.702 m)   Wt 293 lb (132.9 kg)   BMI 45.89 kg/m   General: Well-nourished, well-developed in no acute distress.  Eyes: No icterus. Conjunctivae pink. Mouth: Oropharyngeal mucosa moist and pink , no lesions erythema or exudate. Neck: Supple, Trachea midline Abdomen: Bowel sounds are normal, nontender, nondistended, no hepatosplenomegaly or masses, no abdominal bruits or hernia , no rebound or guarding.   Extremities: No lower extremity edema. No clubbing or deformities. Neuro: Alert and oriented x 3.  Grossly intact. Skin: Warm and dry, no jaundice.   Psych: Alert and cooperative, normal mood and affect.   Labs: CMP     Component Value Date/Time   NA 139 04/15/2020 1103   NA 142 03/06/2019 0834   K 4.0 04/15/2020 1103   CL 106 04/15/2020 1103   CO2 26 04/15/2020 1103   GLUCOSE 82 04/15/2020 1103   BUN 7 04/15/2020 1103   BUN 8 03/06/2019 0834   CREATININE 0.77 04/15/2020 1103   CALCIUM 9.4 04/15/2020 1103   PROT 7.1 04/15/2020 1103   PROT 7.5 03/06/2019 0834   ALBUMIN 4.2 04/15/2020 1103   ALBUMIN 4.1 03/06/2019 0834   AST 13 04/15/2020 1103   ALT 13 04/15/2020 1103   ALKPHOS 116 04/15/2020 1103  BILITOT 0.4 04/15/2020 1103   BILITOT <0.2 03/06/2019 0834   GFRNONAA >60 01/19/2020 1624   GFRAA >60 01/19/2020 1624   Lab Results  Component Value Date   WBC 5.6 09/27/2020   HGB 11.4 (L) 09/27/2020   HCT 34.8 (L) 09/27/2020   MCV 76.0 (L) 09/27/2020   PLT 410 (H) 09/27/2020    Imaging Studies: No results found.  Assessment and Plan:   [name] is a 29 y.o. y/o female here for follow-up of celiac disease  Her iron deficiency is due to her underlying celiac disease Gluten-free diet encouraged and patient has already  started this I discussed a referral to nutritionist/dietitian.  However, patient refuses and states she is doing well without need for dietitian  We will obtain labs to check for nutritional deficiencies  I have recommended she get pneumonia vaccine by her PCP as it is recommended in patients with celiac disease and also DEXA scan with them.  I have asked her to call her PCP to schedule these  Follow-up with [place] in 6 months to schedule EGD for small bowel biopsies and gastric mapping biopsies  Since she has symptoms with lactose products as well, lactose-free diet recommended as well   Dr Melodie Bouillon

## 2021-01-02 LAB — VITAMIN B6: Vitamin B6: 3.8 ug/L (ref 2.0–32.8)

## 2021-01-02 LAB — FOLATE: Folate: 6.8 ng/mL (ref >3.0)

## 2021-01-02 LAB — VITAMIN D 25 HYDROXY (VIT D DEFICIENCY, FRACTURES): Vit D, 25-Hydroxy: 11.8 ng/mL — ABNORMAL LOW (ref 30.0–100.0)

## 2021-01-02 LAB — VITAMIN E
Vitamin E (Alpha Tocopherol): 10.5 mg/L (ref 5.9–19.4)
Vitamin E(Gamma Tocopherol): 2 mg/L (ref 0.7–4.9)

## 2021-01-02 LAB — COPPER, SERUM: Copper: 111 ug/dL (ref 80–158)

## 2021-01-02 LAB — VITAMIN A: Vitamin A: 36.1 ug/dL (ref 18.9–57.3)

## 2021-01-02 LAB — MAGNESIUM: Magnesium: 2.2 mg/dL (ref 1.6–2.3)

## 2021-01-02 LAB — ZINC: Zinc: 76 ug/dL (ref 44–115)

## 2021-01-02 LAB — CAROTENE, SERUM: Carotene: 5 ug/dL (ref 3–91)

## 2021-01-02 LAB — SELENIUM SERUM: Selenium, S/P: 108 ug/L (ref 93–198)

## 2021-01-02 LAB — VITAMIN B1: Thiamine: 113.4 nmol/L (ref 66.5–200.0)

## 2021-01-02 LAB — VITAMIN B12: Vitamin B-12: 634 pg/mL (ref 232–1245)

## 2021-01-06 DIAGNOSIS — F411 Generalized anxiety disorder: Secondary | ICD-10-CM | POA: Diagnosis not present

## 2021-01-06 DIAGNOSIS — F32A Depression, unspecified: Secondary | ICD-10-CM | POA: Diagnosis not present

## 2021-01-06 DIAGNOSIS — F902 Attention-deficit hyperactivity disorder, combined type: Secondary | ICD-10-CM | POA: Diagnosis not present

## 2021-01-23 DIAGNOSIS — G4733 Obstructive sleep apnea (adult) (pediatric): Secondary | ICD-10-CM | POA: Diagnosis not present

## 2021-01-30 ENCOUNTER — Telehealth: Payer: Self-pay

## 2021-01-30 NOTE — Telephone Encounter (Signed)
-----   Message from Virgel Manifold, MD sent at 01/15/2021  1:21 PM EST ----- Herb Grays please let the patient know, her labs show low vitamin D.  I recommend that she follow-up with her PCP to get vitamin D replacement, pneumonia vaccine and DEXA scan.  If she has any issues with getting these done, please ask her to call us back.  Follow-up as previously planned

## 2021-01-30 NOTE — Telephone Encounter (Signed)
Pt notified of lab results via Mychart.  

## 2021-02-03 ENCOUNTER — Inpatient Hospital Stay: Payer: BC Managed Care – PPO | Attending: Oncology

## 2021-02-03 DIAGNOSIS — D509 Iron deficiency anemia, unspecified: Secondary | ICD-10-CM | POA: Diagnosis not present

## 2021-02-03 LAB — CBC
HCT: 35.8 % — ABNORMAL LOW (ref 36.0–46.0)
Hemoglobin: 11.5 g/dL — ABNORMAL LOW (ref 12.0–15.0)
MCH: 24.9 pg — ABNORMAL LOW (ref 26.0–34.0)
MCHC: 32.1 g/dL (ref 30.0–36.0)
MCV: 77.5 fL — ABNORMAL LOW (ref 80.0–100.0)
Platelets: 381 10*3/uL (ref 150–400)
RBC: 4.62 MIL/uL (ref 3.87–5.11)
RDW: 15.5 % (ref 11.5–15.5)
WBC: 7.3 10*3/uL (ref 4.0–10.5)
nRBC: 0 % (ref 0.0–0.2)

## 2021-02-03 LAB — IRON AND TIBC
Iron: 35 ug/dL (ref 28–170)
Saturation Ratios: 9 % — ABNORMAL LOW (ref 10.4–31.8)
TIBC: 382 ug/dL (ref 250–450)
UIBC: 347 ug/dL

## 2021-02-03 LAB — FERRITIN: Ferritin: 48 ng/mL (ref 11–307)

## 2021-02-04 ENCOUNTER — Inpatient Hospital Stay (HOSPITAL_BASED_OUTPATIENT_CLINIC_OR_DEPARTMENT_OTHER): Payer: BC Managed Care – PPO | Admitting: Oncology

## 2021-02-04 ENCOUNTER — Encounter: Payer: Self-pay | Admitting: Oncology

## 2021-02-04 ENCOUNTER — Telehealth: Payer: Self-pay | Admitting: Oncology

## 2021-02-04 DIAGNOSIS — D509 Iron deficiency anemia, unspecified: Secondary | ICD-10-CM | POA: Diagnosis not present

## 2021-02-04 DIAGNOSIS — E538 Deficiency of other specified B group vitamins: Secondary | ICD-10-CM

## 2021-02-04 NOTE — Telephone Encounter (Signed)
Spoke with pt to confirm future appt times after her virtual visit today 02/04/21. Pt stated scheduled times all work for her schedule. Sending AVS via mail.

## 2021-02-04 NOTE — Progress Notes (Signed)
Pt states that she has not been doing iron pills so the labs that she had done yest.the numbers are without iron pills. No concerns

## 2021-02-06 DIAGNOSIS — Z13220 Encounter for screening for lipoid disorders: Secondary | ICD-10-CM | POA: Diagnosis not present

## 2021-02-06 DIAGNOSIS — K900 Celiac disease: Secondary | ICD-10-CM | POA: Diagnosis not present

## 2021-02-06 DIAGNOSIS — Z1159 Encounter for screening for other viral diseases: Secondary | ICD-10-CM | POA: Diagnosis not present

## 2021-02-06 DIAGNOSIS — R7303 Prediabetes: Secondary | ICD-10-CM | POA: Diagnosis not present

## 2021-02-06 DIAGNOSIS — Z23 Encounter for immunization: Secondary | ICD-10-CM | POA: Diagnosis not present

## 2021-02-07 DIAGNOSIS — F411 Generalized anxiety disorder: Secondary | ICD-10-CM | POA: Diagnosis not present

## 2021-02-07 DIAGNOSIS — F902 Attention-deficit hyperactivity disorder, combined type: Secondary | ICD-10-CM | POA: Diagnosis not present

## 2021-02-07 DIAGNOSIS — Z114 Encounter for screening for human immunodeficiency virus [HIV]: Secondary | ICD-10-CM | POA: Diagnosis not present

## 2021-02-07 DIAGNOSIS — R7303 Prediabetes: Secondary | ICD-10-CM | POA: Diagnosis not present

## 2021-02-07 DIAGNOSIS — Z13220 Encounter for screening for lipoid disorders: Secondary | ICD-10-CM | POA: Diagnosis not present

## 2021-02-07 DIAGNOSIS — F32A Depression, unspecified: Secondary | ICD-10-CM | POA: Diagnosis not present

## 2021-02-07 DIAGNOSIS — Z1159 Encounter for screening for other viral diseases: Secondary | ICD-10-CM | POA: Diagnosis not present
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
## 2021-03-06 DIAGNOSIS — F411 Generalized anxiety disorder: Secondary | ICD-10-CM | POA: Diagnosis not present

## 2021-03-06 DIAGNOSIS — F902 Attention-deficit hyperactivity disorder, combined type: Secondary | ICD-10-CM | POA: Diagnosis not present

## 2021-03-06 DIAGNOSIS — F32A Depression, unspecified: Secondary | ICD-10-CM | POA: Diagnosis not present

## 2021-03-06 DIAGNOSIS — K900 Celiac disease: Secondary | ICD-10-CM | POA: Diagnosis not present

## 2021-03-20 DIAGNOSIS — K900 Celiac disease: Secondary | ICD-10-CM | POA: Diagnosis not present

## 2021-03-20 DIAGNOSIS — Z30430 Encounter for insertion of intrauterine contraceptive device: Secondary | ICD-10-CM | POA: Diagnosis not present

## 2021-03-31 ENCOUNTER — Other Ambulatory Visit: Payer: Self-pay

## 2021-03-31 ENCOUNTER — Ambulatory Visit: Payer: BC Managed Care – PPO | Admitting: Adult Health

## 2021-04-03 ENCOUNTER — Inpatient Hospital Stay: Payer: BC Managed Care – PPO | Attending: Oncology

## 2021-04-03 DIAGNOSIS — D509 Iron deficiency anemia, unspecified: Secondary | ICD-10-CM | POA: Diagnosis not present

## 2021-04-03 DIAGNOSIS — E538 Deficiency of other specified B group vitamins: Secondary | ICD-10-CM

## 2021-04-03 LAB — CBC WITH DIFFERENTIAL/PLATELET
Abs Immature Granulocytes: 0.01 10*3/uL (ref 0.00–0.07)
Basophils Absolute: 0.1 10*3/uL (ref 0.0–0.1)
Basophils Relative: 1 %
Eosinophils Absolute: 0.1 10*3/uL (ref 0.0–0.5)
Eosinophils Relative: 2 %
HCT: 38.4 % (ref 36.0–46.0)
Hemoglobin: 12.2 g/dL (ref 12.0–15.0)
Immature Granulocytes: 0 %
Lymphocytes Relative: 39 %
Lymphs Abs: 2.2 10*3/uL (ref 0.7–4.0)
MCH: 25.1 pg — ABNORMAL LOW (ref 26.0–34.0)
MCHC: 31.8 g/dL (ref 30.0–36.0)
MCV: 79 fL — ABNORMAL LOW (ref 80.0–100.0)
Monocytes Absolute: 0.5 10*3/uL (ref 0.1–1.0)
Monocytes Relative: 10 %
Neutro Abs: 2.7 10*3/uL (ref 1.7–7.7)
Neutrophils Relative %: 48 %
Platelets: 430 10*3/uL — ABNORMAL HIGH (ref 150–400)
RBC: 4.86 MIL/uL (ref 3.87–5.11)
RDW: 15.2 % (ref 11.5–15.5)
WBC: 5.6 10*3/uL (ref 4.0–10.5)
nRBC: 0 % (ref 0.0–0.2)

## 2021-04-03 LAB — IRON AND TIBC
Iron: 58 ug/dL (ref 28–170)
Saturation Ratios: 15 % (ref 10.4–31.8)
TIBC: 381 ug/dL (ref 250–450)
UIBC: 323 ug/dL

## 2021-04-03 LAB — FERRITIN: Ferritin: 56 ng/mL (ref 11–307)

## 2021-04-04 ENCOUNTER — Inpatient Hospital Stay: Payer: BC Managed Care – PPO

## 2021-04-14 ENCOUNTER — Other Ambulatory Visit: Payer: 59

## 2021-04-18 ENCOUNTER — Encounter: Payer: 59 | Admitting: Family Medicine

## 2021-04-28 ENCOUNTER — Telehealth: Payer: Self-pay

## 2021-04-28 NOTE — Telephone Encounter (Signed)
Called patient back and she did not answer. Therefore, I will go ahead and send her a patient message via [place].

## 2021-04-28 NOTE — Telephone Encounter (Signed)
Patient says Dr. Darene Lamer told her to schedule another upper and lower procedure in 6 mths. She's ready to schedule but I didn't see the documentation and reccommended time frame in the procedure notes. Please advise

## 2021-05-01 DIAGNOSIS — Z30431 Encounter for routine checking of intrauterine contraceptive device: Secondary | ICD-10-CM | POA: Diagnosis not present

## 2021-05-01 DIAGNOSIS — F321 Major depressive disorder, single episode, moderate: Secondary | ICD-10-CM | POA: Diagnosis not present

## 2021-05-12 DIAGNOSIS — F411 Generalized anxiety disorder: Secondary | ICD-10-CM | POA: Diagnosis not present

## 2021-05-12 DIAGNOSIS — F32A Depression, unspecified: Secondary | ICD-10-CM | POA: Diagnosis not present

## 2021-05-12 DIAGNOSIS — F902 Attention-deficit hyperactivity disorder, combined type: Secondary | ICD-10-CM | POA: Diagnosis not present

## 2021-05-19 ENCOUNTER — Ambulatory Visit: Payer: BC Managed Care – PPO | Admitting: Gastroenterology

## 2021-05-21 DIAGNOSIS — F331 Major depressive disorder, recurrent, moderate: Secondary | ICD-10-CM | POA: Diagnosis not present

## 2021-05-22 ENCOUNTER — Other Ambulatory Visit: Payer: Self-pay

## 2021-05-22 ENCOUNTER — Ambulatory Visit: Payer: BC Managed Care – PPO | Admitting: Gastroenterology

## 2021-05-22 VITALS — BP 112/79 | HR 93 | Wt 285.0 lb

## 2021-05-22 DIAGNOSIS — R197 Diarrhea, unspecified: Secondary | ICD-10-CM | POA: Diagnosis not present

## 2021-05-22 DIAGNOSIS — K900 Celiac disease: Secondary | ICD-10-CM | POA: Diagnosis not present

## 2021-05-22 NOTE — Progress Notes (Signed)
Sophia Antigua, MD [address]  [place]  [place], [place] [address]  Main: [phone]  Fax: [phone]   Primary Care Physician: Jinny Sanders, MD   Chief Complaint  Patient presents with  . Follow-up    Pt reports she has been having loose/watery stools x 2 days... denies any other Sx....    HPI: [name] is a 30 y.o. female with history of celiac disease here for follow-up.  Patient reports compliance to gluten-free diet.  Reports having multiple loose bowel movements a day over the last 2 days, associated with abdominal cramping.  No blood in stool.  No fever or chills.  No sick contacts or recent travel.  Blood work on last visit for nutritional deficiencies showed vitamin D deficiency and patient was asked to follow-up with PCP for vitamin D replacement.  Pneumonia vaccine and DEXA scan was also recommended but have not been done.  Current Outpatient Medications  Medication Sig Dispense Refill  . acetaminophen (TYLENOL) 500 MG tablet Take 500 mg by mouth every 6 (six) hours as needed.    . busPIRone (BUSPAR) 15 MG tablet Take 15 mg by mouth.    . Cholecalciferol (VITAMIN D) 50 MCG (2000 UT) CAPS Take by mouth.    . cyclobenzaprine (FLEXERIL) 10 MG tablet Take 10 mg by mouth 3 (three) times daily as needed for muscle spasms.    Marland Kitchen desvenlafaxine (PRISTIQ) 50 MG 24 hr tablet Take 50 mg by mouth daily.    . ferrous sulfate 325 (65 FE) MG tablet Take 325 mg by mouth daily with breakfast.    . ibuprofen (ADVIL) 800 MG tablet Take 1 tablet (800 mg total) by mouth every 8 (eight) hours as needed for mild pain or moderate pain. 30 tablet 1  . metFORMIN (GLUCOPHAGE) 500 MG tablet Take 500 mg by mouth in the morning and at bedtime.    . vitamin B-12 (CYANOCOBALAMIN) 500 MCG tablet Take 500 mcg by mouth daily.     No current facility-administered medications for this visit.    Allergies as of 05/22/2021 - Review Complete 05/22/2021  Allergen Reaction Noted   . Gluten meal  02/04/2021    ROS:  General: Negative for anorexia, weight loss, fever, chills, fatigue, weakness. ENT: Negative for hoarseness, difficulty swallowing , nasal congestion. CV: Negative for chest pain, angina, palpitations, dyspnea on exertion, peripheral edema.  Respiratory: Negative for dyspnea at rest, dyspnea on exertion, cough, sputum, wheezing.  GI: See history of present illness. GU:  Negative for dysuria, hematuria, urinary incontinence, urinary frequency, nocturnal urination.  Endo: Negative for unusual weight change.    Physical Examination:   BP 112/79   Pulse 93   Wt 285 lb (129.3 kg)   BMI 44.64 kg/m   General: Well-nourished, well-developed in no acute distress.  Eyes: No icterus. Conjunctivae pink. Mouth: Oropharyngeal mucosa moist and pink , no lesions erythema or exudate. Neck: Supple, Trachea midline Abdomen: Bowel sounds are normal, nontender, nondistended, no hepatosplenomegaly or masses, no abdominal bruits or hernia , no rebound or guarding.   Extremities: No lower extremity edema. No clubbing or deformities. Neuro: Alert and oriented x 3.  Grossly intact. Skin: Warm and dry, no jaundice.   Psych: Alert and cooperative, normal mood and affect.   Labs: CMP     Component Value Date/Time   NA 139 04/15/2020 1103   NA 142 03/06/2019 0834   K 4.0 04/15/2020 1103   CL 106 04/15/2020 1103  CO2 26 04/15/2020 1103   GLUCOSE 82 04/15/2020 1103   BUN 7 04/15/2020 1103   BUN 8 03/06/2019 0834   CREATININE 0.77 04/15/2020 1103   CALCIUM 9.4 04/15/2020 1103   PROT 7.1 04/15/2020 1103   PROT 7.5 03/06/2019 0834   ALBUMIN 4.2 04/15/2020 1103   ALBUMIN 4.1 03/06/2019 0834   AST 13 04/15/2020 1103   ALT 13 04/15/2020 1103   ALKPHOS 116 04/15/2020 1103   BILITOT 0.4 04/15/2020 1103   BILITOT <0.2 03/06/2019 0834   GFRNONAA >60 01/19/2020 1624   GFRAA >60 01/19/2020 1624   Lab Results  Component Value Date   WBC 5.6 04/03/2021   HGB 12.2  04/03/2021   HCT 38.4 04/03/2021   MCV 79.0 (L) 04/03/2021   PLT 430 (H) 04/03/2021    Imaging Studies: No results found.  Assessment and Plan:   [name] is a 30 y.o. y/o female with history of celiac disease here for follow-up, now reporting acute diarrhea  Patient will need testing for infectious etiology given her acute changes in symptoms  Her last upper endoscopy was in September 2021 and the plan was to repeat upper endoscopy in 6 months from this to evaluate for improvement in small bowel biopsies after compliance with gluten-free diet.  Timing of this will need to be determined based on her new Acute symptoms  Patient advised to hydrate well with water and maintain p.o. nutrition as tolerated  Patient advised to start antidiarrheal once or twice a day over-the-counter once C Diff is ruled out. Advised to submit a stool sample within a day or 2   Follow-up in clinic closely in the next 4 to 6 weeks to reassess symptoms, schedule upper endoscopy if necessary, schedule DEXA scan and rediscuss pneumonia vaccine  Dr [name]

## 2021-05-23 DIAGNOSIS — R197 Diarrhea, unspecified: Secondary | ICD-10-CM | POA: Diagnosis not present

## 2021-05-27 DIAGNOSIS — F331 Major depressive disorder, recurrent, moderate: Secondary | ICD-10-CM | POA: Diagnosis not present

## 2021-05-27 LAB — GI PROFILE, STOOL, PCR

## 2021-06-04 DIAGNOSIS — F331 Major depressive disorder, recurrent, moderate: Secondary | ICD-10-CM | POA: Diagnosis not present

## 2021-06-09 ENCOUNTER — Inpatient Hospital Stay: Payer: BC Managed Care – PPO | Admitting: Nurse Practitioner

## 2021-06-09 ENCOUNTER — Inpatient Hospital Stay: Payer: BC Managed Care – PPO

## 2021-06-12 ENCOUNTER — Inpatient Hospital Stay (HOSPITAL_BASED_OUTPATIENT_CLINIC_OR_DEPARTMENT_OTHER): Payer: BC Managed Care – PPO | Admitting: Nurse Practitioner

## 2021-06-12 ENCOUNTER — Other Ambulatory Visit: Payer: Self-pay

## 2021-06-12 ENCOUNTER — Inpatient Hospital Stay: Payer: BC Managed Care – PPO | Attending: Oncology

## 2021-06-12 DIAGNOSIS — D509 Iron deficiency anemia, unspecified: Secondary | ICD-10-CM

## 2021-06-12 DIAGNOSIS — E538 Deficiency of other specified B group vitamins: Secondary | ICD-10-CM

## 2021-06-12 LAB — CBC WITH DIFFERENTIAL/PLATELET
Abs Immature Granulocytes: 0.03 10*3/uL (ref 0.00–0.07)
Basophils Absolute: 0.1 10*3/uL (ref 0.0–0.1)
Basophils Relative: 1 %
Eosinophils Absolute: 0.2 10*3/uL (ref 0.0–0.5)
Eosinophils Relative: 3 %
HCT: 37.6 % (ref 36.0–46.0)
Hemoglobin: 12.1 g/dL (ref 12.0–15.0)
Immature Granulocytes: 1 %
Lymphocytes Relative: 36 %
Lymphs Abs: 1.9 10*3/uL (ref 0.7–4.0)
MCH: 25.7 pg — ABNORMAL LOW (ref 26.0–34.0)
MCHC: 32.2 g/dL (ref 30.0–36.0)
MCV: 79.8 fL — ABNORMAL LOW (ref 80.0–100.0)
Monocytes Absolute: 0.5 10*3/uL (ref 0.1–1.0)
Monocytes Relative: 10 %
Neutro Abs: 2.5 10*3/uL (ref 1.7–7.7)
Neutrophils Relative %: 49 %
Platelets: 404 10*3/uL — ABNORMAL HIGH (ref 150–400)
RBC: 4.71 MIL/uL (ref 3.87–5.11)
RDW: 14.3 % (ref 11.5–15.5)
WBC: 5.1 10*3/uL (ref 4.0–10.5)
nRBC: 0 % (ref 0.0–0.2)

## 2021-06-12 LAB — IRON AND TIBC
Iron: 55 ug/dL (ref 28–170)
Saturation Ratios: 15 % (ref 10.4–31.8)
TIBC: 363 ug/dL (ref 250–450)
UIBC: 308 ug/dL

## 2021-06-12 LAB — FERRITIN: Ferritin: 71 ng/mL (ref 11–307)
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
## 2021-06-17 DIAGNOSIS — F321 Major depressive disorder, single episode, moderate: Secondary | ICD-10-CM | POA: Diagnosis not present

## 2021-06-17 DIAGNOSIS — F411 Generalized anxiety disorder: Secondary | ICD-10-CM | POA: Diagnosis not present

## 2021-06-17 DIAGNOSIS — F902 Attention-deficit hyperactivity disorder, combined type: Secondary | ICD-10-CM | POA: Diagnosis not present

## 2021-06-18 DIAGNOSIS — F331 Major depressive disorder, recurrent, moderate: Secondary | ICD-10-CM | POA: Diagnosis not present

## 2021-06-25 DIAGNOSIS — F331 Major depressive disorder, recurrent, moderate: Secondary | ICD-10-CM | POA: Diagnosis not present

## 2021-07-02 ENCOUNTER — Other Ambulatory Visit: Payer: Self-pay

## 2021-07-02 ENCOUNTER — Ambulatory Visit: Payer: BC Managed Care – PPO | Admitting: Gastroenterology

## 2021-07-02 VITALS — BP 125/85 | HR 94 | Temp 98.7°F | Wt 284.4 lb

## 2021-07-02 DIAGNOSIS — K900 Celiac disease: Secondary | ICD-10-CM

## 2021-07-02 DIAGNOSIS — K31A0 Gastric intestinal metaplasia, unspecified: Secondary | ICD-10-CM

## 2021-07-02 NOTE — Addendum Note (Signed)
Addended by: Lurlean Nanny on: 07/02/2021 04:39 PM   Modules accepted: Orders, SmartSet

## 2021-07-02 NOTE — Progress Notes (Signed)
Sophia Antigua, MD [address]  [place]  [place], [place] [address]  Main: [phone]  Fax: [phone]   Primary Care Physician: Reola Mosher, MD   Chief Complaint  Patient presents with   Follow-up    Pt reports went back to normal within 1 week since last OV...  Celiac disease  HPI: [name] is a 30 y.o. female here for follow-up of celiac disease.  Patient plans gluten-free diet and has been doing well.  When she was last seen in May 2022 she had acute infection with EPEC and had acute diarrhea with that.  This has now resolved, and patient is reporting 1-2 formed bowel movements a day.  No blood.  No abdominal pain.  No nausea or vomiting.  No weight loss.  Has not received pneumonia vaccine yet this was previously recommended.  DEXA scan completed, March 2022 and was normal.  See Care Everywhere for report.  Patient has been seen by hematology since last visit and labs were recently done in June 2022. I have reviewed their note from June 12 2021 and they do not recommend IV iron replacement at this time  ROS: All ROS reviewed and negative except as per HPI   Past Medical History:  Diagnosis Date   Anemia    Anxiety    Celiac disease    Depression    History of MRSA infection    Migraine    Pilonidal cyst    Polycystic ovaries     Past Surgical History:  Procedure Laterality Date   COLONOSCOPY WITH PROPOFOL N/A 09/25/2020   Procedure: COLONOSCOPY WITH PROPOFOL;  Surgeon: Virgel Manifold, MD;  Location: [facility] ENDOSCOPY;  Service: Endoscopy;  Laterality: N/A;   ESOPHAGOGASTRODUODENOSCOPY (EGD) WITH PROPOFOL N/A 09/25/2020   Procedure: ESOPHAGOGASTRODUODENOSCOPY (EGD) WITH PROPOFOL;  Surgeon: Virgel Manifold, MD;  Location: [facility] ENDOSCOPY;  Service: Endoscopy;  Laterality: N/A;   INCISION AND DRAINAGE     x3 pilonidal cyst   PILONIDAL CYST EXCISION N/A 02/14/2020   Procedure: CYST EXCISION PILONIDAL EXTENSIVE;  Surgeon: Olean Ree,  MD;  Location: [facility] ORS;  Service: General;  Laterality: N/A;    Prior to Admission medications   Medication Sig Start Date End Date Taking? Authorizing Provider  acetaminophen (TYLENOL) 500 MG tablet Take 500 mg by mouth every 6 (six) hours as needed.   Yes [provider]  ARIPiprazole (ABILIFY) 2 MG tablet Take 2 mg by mouth daily. 06/17/21  Yes [provider]  busPIRone (BUSPAR) 15 MG tablet Take 15 mg by mouth. 05/12/21  Yes [provider]  Calcium Carbonate-Vit D-Min (CALCIUM 600+D PLUS MINERALS PO) Take by mouth.   Yes [provider]  Cholecalciferol (VITAMIN D) 50 MCG (2000 UT) CAPS Take by mouth.   Yes [provider]  desvenlafaxine (PRISTIQ) 50 MG 24 hr tablet Take 50 mg by mouth daily. 12/16/20  Yes [provider]  ferrous sulfate 325 (65 FE) MG tablet Take 325 mg by mouth daily with breakfast.   Yes [provider]  metFORMIN (GLUCOPHAGE) 500 MG tablet Take 500 mg by mouth in the morning and at bedtime. 02/13/21  Yes [provider]  vitamin B-12 (CYANOCOBALAMIN) 500 MCG tablet Take 500 mcg by mouth daily.   Yes [provider]    Family History  Problem Relation Age of Onset   Leukemia Mother 59   Hypertension Father      Social History   Tobacco Use  Smoking status: Never   Smokeless tobacco: Never  Vaping Use   Vaping Use: Never used  Substance Use Topics   Alcohol use: Yes    Comment: seldom, less than one drink weekly   Drug use: Yes    Types: Marijuana    Comment: rarely    Allergies as of 07/02/2021 - Review Complete 07/02/2021  Allergen Reaction Noted   Gluten meal  02/04/2021    Physical Examination:  Constitutional: General:   Alert,  Well-developed, well-nourished, pleasant and cooperative in NAD BP 125/85   Pulse 94   Temp 98.7 F (37.1 C) (Oral)   Wt 284 lb 6.4 oz (129 kg)   BMI 44.54 kg/m   Respiratory: Normal respiratory effort  Gastrointestinal:   Normal bowel sounds.  No bruits.  Soft, non-tender and non-distended without masses, hepatosplenomegaly or hernias noted.  No guarding or rebound tenderness.     Cardiac: No clubbing or edema.  No cyanosis. Normal posterior tibial pedal pulses noted.  Psych:  Alert and cooperative. Normal mood and affect.  Musculoskeletal:  Normal gait. Head normocephalic, atraumatic. Symmetrical without gross deformities. 5/5 Lower extremity strength bilaterally.  Skin: Warm. Intact without significant lesions or rashes. No jaundice.  Neck: Supple, trachea midline  Lymph: No cervical lymphadenopathy  Psych:  Alert and oriented x3, Alert and cooperative. Normal mood and affect.  Labs: CMP     Component Value Date/Time   NA 139 04/15/2020 1103   NA 142 03/06/2019 0834   K 4.0 04/15/2020 1103   CL 106 04/15/2020 1103   CO2 26 04/15/2020 1103   GLUCOSE 82 04/15/2020 1103   BUN 7 04/15/2020 1103   BUN 8 03/06/2019 0834   CREATININE 0.77 04/15/2020 1103   CALCIUM 9.4 04/15/2020 1103   PROT 7.1 04/15/2020 1103   PROT 7.5 03/06/2019 0834   ALBUMIN 4.2 04/15/2020 1103   ALBUMIN 4.1 03/06/2019 0834   AST 13 04/15/2020 1103   ALT 13 04/15/2020 1103   ALKPHOS 116 04/15/2020 1103   BILITOT 0.4 04/15/2020 1103   BILITOT <0.2 03/06/2019 0834   GFRNONAA >60 01/19/2020 1624   GFRAA >60 01/19/2020 1624   Lab Results  Component Value Date   WBC 5.1 06/12/2021   HGB 12.1 06/12/2021   HCT 37.6 06/12/2021   MCV 79.8 (L) 06/12/2021   PLT 404 (H) 06/12/2021   Ferritin normal at 71 Iron level normal at 55 Iron saturation 15  Imaging Studies: DEXA scan reviewed in care everywhere  Assessment and Plan:   [name] is a 30 y.o. y/o female here for follow-up of celiac disease  Patient remains clinically asymptomatic at this time and on a gluten-free diet and compliant with the same  Importance of pneumonia vaccine discussed with the patient and this note will be faxed over to her PCP since  they are yet [facility], with a recommendations to proceed with pneumonia vaccine.  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  The patient agrees with this plan & written consent will be obtained.     Dr [name]

## 2021-07-16 ENCOUNTER — Encounter: Payer: Self-pay | Admitting: Gastroenterology

## 2021-07-16 DIAGNOSIS — F321 Major depressive disorder, single episode, moderate: Secondary | ICD-10-CM | POA: Diagnosis not present

## 2021-07-16 DIAGNOSIS — F902 Attention-deficit hyperactivity disorder, combined type: Secondary | ICD-10-CM | POA: Diagnosis not present

## 2021-07-16 DIAGNOSIS — F411 Generalized anxiety disorder: Secondary | ICD-10-CM | POA: Diagnosis not present

## 2021-07-17 ENCOUNTER — Ambulatory Visit: Payer: BC Managed Care – PPO | Admitting: Anesthesiology

## 2021-07-17 ENCOUNTER — Encounter
Admission: RE | Disposition: A | Discharge status: Home / Self Care | Payer: Self-pay | Source: Home / Self Care | Attending: Gastroenterology

## 2021-07-17 ENCOUNTER — Ambulatory Visit
Admission: RE | Admit: 2021-07-17 | Discharge: 2021-07-17 | Disposition: A | Discharge status: Home / Self Care | Payer: BC Managed Care – PPO | Attending: Gastroenterology | Admitting: Gastroenterology

## 2021-07-17 ENCOUNTER — Encounter: Payer: Self-pay | Admitting: Gastroenterology

## 2021-07-17 DIAGNOSIS — Z09 Encounter for follow-up examination after completed treatment for conditions other than malignant neoplasm: Secondary | ICD-10-CM | POA: Insufficient documentation

## 2021-07-17 DIAGNOSIS — Z7984 Long term (current) use of oral hypoglycemic drugs: Secondary | ICD-10-CM | POA: Diagnosis not present

## 2021-07-17 DIAGNOSIS — K31A0 Gastric intestinal metaplasia, unspecified: Secondary | ICD-10-CM

## 2021-07-17 DIAGNOSIS — Z8614 Personal history of Methicillin resistant Staphylococcus aureus infection: Secondary | ICD-10-CM | POA: Insufficient documentation

## 2021-07-17 DIAGNOSIS — K2950 Unspecified chronic gastritis without bleeding: Secondary | ICD-10-CM | POA: Diagnosis not present

## 2021-07-17 DIAGNOSIS — K900 Celiac disease: Secondary | ICD-10-CM

## 2021-07-17 DIAGNOSIS — Z79899 Other long term (current) drug therapy: Secondary | ICD-10-CM | POA: Insufficient documentation

## 2021-07-17 DIAGNOSIS — Z6841 Body Mass Index (BMI) 40.0 and over, adult: Secondary | ICD-10-CM | POA: Diagnosis not present

## 2021-07-17 HISTORY — PX: ESOPHAGOGASTRODUODENOSCOPY (EGD) WITH PROPOFOL: SHX5813

## 2021-07-17 LAB — GLUCOSE, CAPILLARY: Glucose-Capillary: 90 mg/dL (ref 70–99)

## 2021-07-17 LAB — POCT PREGNANCY, URINE: Preg Test, Ur: NEGATIVE

## 2021-07-17 SURGERY — ESOPHAGOGASTRODUODENOSCOPY (EGD) WITH PROPOFOL
Anesthesia: General

## 2021-07-17 MED ORDER — LIDOCAINE HCL (CARDIAC) PF 100 MG/5ML IV SOSY
PREFILLED_SYRINGE | INTRAVENOUS | Status: DC | PRN
  Administered 2021-07-17: 100 mg via INTRAVENOUS

## 2021-07-17 MED ORDER — DEXMEDETOMIDINE HCL 200 MCG/2ML IV SOLN
INTRAVENOUS | Status: DC | PRN
  Administered 2021-07-17: 8 ug via INTRAVENOUS
  Administered 2021-07-17: 12 ug via INTRAVENOUS

## 2021-07-17 MED ORDER — SODIUM CHLORIDE 0.9 % IV SOLN
INTRAVENOUS | Status: DC
  Administered 2021-07-17: 1000 mL via INTRAVENOUS

## 2021-07-17 MED ORDER — PROPOFOL 10 MG/ML IV BOLUS
INTRAVENOUS | Status: DC | PRN
  Administered 2021-07-17: 30 mg via INTRAVENOUS
  Administered 2021-07-17: 70 mg via INTRAVENOUS
  Administered 2021-07-17 (×2): 30 mg via INTRAVENOUS
  Administered 2021-07-17: 20 mg via INTRAVENOUS
  Administered 2021-07-17: 50 mg via INTRAVENOUS

## 2021-07-17 MED ORDER — GLYCOPYRROLATE 0.2 MG/ML IJ SOLN
INTRAMUSCULAR | Status: DC | PRN
  Administered 2021-07-17: .2 mg via INTRAVENOUS

## 2021-07-17 NOTE — Transfer of Care (Signed)
Immediate Anesthesia Transfer of Care Note  Patient: [name]  Procedure(s) Performed: ESOPHAGOGASTRODUODENOSCOPY (EGD) WITH PROPOFOL  Patient Location: PACU  Anesthesia Type:General  Level of Consciousness: awake, alert  and oriented  Airway & Oxygen Therapy: Patient Spontanous Breathing  Post-op Assessment: Report given to RN and Post -op Vital signs reviewed and stable  Post vital signs: Reviewed and stable  Last Vitals:  Vitals Value Taken Time  BP    Temp    Pulse 91 07/17/21 1000  Resp 10 07/17/21 1000  SpO2 96 % 07/17/21 1000  Vitals shown include unvalidated device data.  Last Pain:  Vitals:   07/17/21 0925  TempSrc: Temporal  PainSc: 0-No pain         Complications: No notable events documented.

## 2021-07-17 NOTE — Anesthesia Postprocedure Evaluation (Signed)
Anesthesia Post Note  Patient: [name]  Procedure(s) Performed: ESOPHAGOGASTRODUODENOSCOPY (EGD) WITH PROPOFOL  Patient location during evaluation: Phase II Anesthesia Type: General Level of consciousness: awake and alert, awake and oriented Pain management: pain level controlled Vital Signs Assessment: post-procedure vital signs reviewed and stable Respiratory status: spontaneous breathing, nonlabored ventilation and respiratory function stable Cardiovascular status: blood pressure returned to baseline and stable Postop Assessment: no apparent nausea or vomiting Anesthetic complications: no   No notable events documented.   Last Vitals:  Vitals:   07/17/21 1016 07/17/21 1031  BP: 115/79 130/75  Pulse:    Resp:    Temp:    SpO2: 100% 100%    Last Pain:  Vitals:   07/17/21 1031  TempSrc:   PainSc: 0-No pain                 Phill Mutter

## 2021-07-17 NOTE — Op Note (Signed)
[facility] Gastroenterology Patient Name: [name] Procedure Date: 07/17/2021 9:43 AM MRN: [id number] Account #: [id number] Date of Birth: 1991/09/03 Admit Type: Outpatient Age: 30 Room: [facility] ENDO ROOM 2 Gender: Female Note Status: Finalized Procedure:             Upper GI endoscopy Indications:           Follow-up of celiac disease, Follow-up of intestinal                         metaplasia Providers:             Missael Ferrari B. Bonna Gains MD, MD Referring MD:          Forest Gleason Md, MD (Referring MD) Medicines:             Monitored Anesthesia Care Complications:         No immediate complications. Procedure:             Pre-Anesthesia Assessment:                        - Prior to the procedure, a History and Physical was                         performed, and patient medications, allergies and                         sensitivities were reviewed. The patient's tolerance                         of previous anesthesia was reviewed.                        - The risks and benefits of the procedure and the                         sedation options and risks were discussed with the                         patient. All questions were answered and informed                         consent was obtained.                        - Patient identification and proposed procedure were                         verified prior to the procedure by the physician, the                         nurse, the anesthesiologist, the anesthetist and the                         technician. The procedure was verified in the                         procedure room.                        - ASA Grade Assessment: II -  A patient with mild                         systemic disease.                        After obtaining informed consent, the endoscope was                         passed under direct vision. Throughout the procedure,                         the patient's blood pressure, pulse, and oxygen                          saturations were monitored continuously. The Endoscope                         was introduced through the mouth, and advanced to the                         second part of duodenum. The upper GI endoscopy was                         accomplished with ease. The patient tolerated the                         procedure well. Findings:      The examined esophagus was normal.      The entire examined stomach was normal. Biopsies were obtained in the       gastric body, at the incisura and in the gastric antrum with cold       forceps for histology.      The duodenal bulb, second portion of the duodenum and examined duodenum       were normal. Biopsies for histology were taken with a cold forceps for       evaluation of celiac disease. The ampulla was seen around the 10 to 11 o       clock position and appeared normal. Biopsies were done away from the       ampulla and not near it. Biopsies were done between the 2 o clock to 6 o       clock positions. Impression:            - Normal esophagus.                        - Normal stomach.                        - Normal duodenal bulb, second portion of the duodenum                         and examined duodenum. Biopsied.                        - Biopsies were obtained in the gastric body, at the                         incisura and in the gastric antrum. Recommendation:        -  Await pathology results.                        - Discharge patient to home (with escort).                        - Advance diet as tolerated.                        - Continue present medications.                        - Patient has a contact number available for                         emergencies. The signs and symptoms of potential                         delayed complications were discussed with the patient.                         Return to normal activities tomorrow. Written                         discharge instructions were provided to the  patient.                        - Discharge patient to home (with escort).                        - The findings and recommendations were discussed with                         the patient.                        - The findings and recommendations were discussed with                         the patient's family. Procedure Code(s):     --- Professional ---                        [phone], Esophagogastroduodenoscopy, flexible,                         transoral; with biopsy, single or multiple Diagnosis Code(s):     --- Professional ---                        K90.0, Celiac disease                        K31.89, Other diseases of stomach and duodenum CPT copyright 2019 American Medical Association. All rights reserved. The codes documented in this report are preliminary and upon coder review may  be revised to meet current compliance requirements.  [name], MD Margretta Sidle B. Bonna Gains MD, MD 07/17/2021 10:05:16 AM This report has been signed electronically. Number of Addenda: 0 Note Initiated On: 07/17/2021 9:43 AM Estimated Blood Loss:  Estimated blood loss: none.      [facility]

## 2021-07-17 NOTE — Anesthesia Preprocedure Evaluation (Signed)
Anesthesia Evaluation  Patient identified by MRN, date of birth, ID band Patient awake    Reviewed: Allergy & Precautions, H&P , NPO status , Patient's Chart, lab work & pertinent test results, reviewed documented beta blocker date and time   Airway Mallampati: II   Neck ROM: full    Dental  (+) Poor Dentition   Pulmonary sleep apnea and Continuous Positive Airway Pressure Ventilation ,    Pulmonary exam normal        Cardiovascular Exercise Tolerance: Good negative cardio ROS Normal cardiovascular exam Rhythm:regular Rate:Normal     Neuro/Psych  Headaches, PSYCHIATRIC DISORDERS Anxiety Depression    GI/Hepatic negative GI ROS, Neg liver ROS,   Endo/Other  diabetes, Well Controlled, Oral Hypoglycemic AgentsMorbid obesity  Renal/GU negative Renal ROS  negative genitourinary   Musculoskeletal   Abdominal   Peds  Hematology negative hematology ROS (+) anemia ,   Anesthesia Other Findings Anemia    Anxiety    Celiac disease   Depression    History of MRSA infection  Migraine    Pilonidal cyst    Polycystic ovaries       Reproductive/Obstetrics negative OB ROS                             Anesthesia Physical  Anesthesia Plan  ASA: 3  Anesthesia Plan: General   Post-op Pain Management:    Induction: Intravenous  PONV Risk Score and Plan: 2 and Propofol infusion and TIVA  Airway Management Planned: Natural Airway and Nasal Cannula  Additional Equipment:   Intra-op Plan:   Post-operative Plan:   Informed Consent: I have reviewed the patients History and Physical, chart, labs and discussed the procedure including the risks, benefits and alternatives for the proposed anesthesia with the patient or authorized representative who has indicated his/her understanding and acceptance.     Dental Advisory Given  Plan Discussed with: CRNA, Anesthesiologist and Surgeon  Anesthesia  Plan Comments:         Anesthesia Quick Evaluation
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
## 2021-07-18 ENCOUNTER — Encounter: Payer: Self-pay | Admitting: Gastroenterology

## 2021-07-23 DIAGNOSIS — F331 Major depressive disorder, recurrent, moderate: Secondary | ICD-10-CM | POA: Diagnosis not present

## 2021-08-05 LAB — SURGICAL PATHOLOGY

## 2021-08-06 DIAGNOSIS — F331 Major depressive disorder, recurrent, moderate: Secondary | ICD-10-CM | POA: Diagnosis not present

## 2021-08-12 DIAGNOSIS — F902 Attention-deficit hyperactivity disorder, combined type: Secondary | ICD-10-CM | POA: Diagnosis not present

## 2021-08-12 DIAGNOSIS — F321 Major depressive disorder, single episode, moderate: Secondary | ICD-10-CM | POA: Diagnosis not present

## 2021-08-12 DIAGNOSIS — F411 Generalized anxiety disorder: Secondary | ICD-10-CM | POA: Diagnosis not present

## 2021-08-19 DIAGNOSIS — F331 Major depressive disorder, recurrent, moderate: Secondary | ICD-10-CM | POA: Diagnosis not present

## 2021-09-02 DIAGNOSIS — F331 Major depressive disorder, recurrent, moderate: Secondary | ICD-10-CM | POA: Diagnosis not present

## 2021-09-12 DIAGNOSIS — F411 Generalized anxiety disorder: Secondary | ICD-10-CM | POA: Diagnosis not present

## 2021-09-12 DIAGNOSIS — F321 Major depressive disorder, single episode, moderate: Secondary | ICD-10-CM | POA: Diagnosis not present

## 2021-09-12 DIAGNOSIS — F902 Attention-deficit hyperactivity disorder, combined type: Secondary | ICD-10-CM | POA: Diagnosis not present

## 2021-09-16 DIAGNOSIS — F331 Major depressive disorder, recurrent, moderate: Secondary | ICD-10-CM | POA: Diagnosis not present

## 2021-10-02 ENCOUNTER — Ambulatory Visit: Payer: BC Managed Care – PPO | Admitting: Gastroenterology

## 2021-10-02 ENCOUNTER — Encounter: Payer: Self-pay | Admitting: Gastroenterology

## 2021-10-02 ENCOUNTER — Other Ambulatory Visit: Payer: Self-pay

## 2021-10-02 VITALS — BP 145/102 | HR 92 | Temp 98.9°F | Wt 296.0 lb

## 2021-10-02 DIAGNOSIS — F321 Major depressive disorder, single episode, moderate: Secondary | ICD-10-CM | POA: Diagnosis not present

## 2021-10-02 DIAGNOSIS — F411 Generalized anxiety disorder: Secondary | ICD-10-CM | POA: Diagnosis not present

## 2021-10-02 DIAGNOSIS — K31A0 Gastric intestinal metaplasia, unspecified: Secondary | ICD-10-CM | POA: Diagnosis not present

## 2021-10-02 DIAGNOSIS — K900 Celiac disease: Secondary | ICD-10-CM | POA: Diagnosis not present

## 2021-10-02 DIAGNOSIS — F902 Attention-deficit hyperactivity disorder, combined type: Secondary | ICD-10-CM | POA: Diagnosis not present

## 2021-10-02 NOTE — Progress Notes (Signed)
Sophia Antigua, MD [address]  [place]  [place], [place] [address]  Main: [phone]  Fax: [phone]   Primary Care Physician: Sophia Mosher, MD   Chief complaint: Celiac disease HPI: [name] is a 30 y.o. female here for follow-up of celiac disease.  Reporting no further loose stools.  Reports 1-2 formed bowel movements a day.  No blood in stool.  No abdominal pain, no nausea or vomiting.   No dysphagia.  He is taking oral vitamin D daily.  States she received her pneumonia vaccine from her PCP.  DEXA scan completed and available in Care Everywhere from March 2022   ROS: All ROS reviewed and negative except as per HPI   Past Medical History:  Diagnosis Date   Anemia    Anxiety    Celiac disease    Depression    History of MRSA infection    Migraine    Pilonidal cyst    Polycystic ovaries     Past Surgical History:  Procedure Laterality Date   COLONOSCOPY WITH PROPOFOL N/A 09/25/2020   Procedure: COLONOSCOPY WITH PROPOFOL;  Surgeon: Virgel Manifold, MD;  Location: [facility];  Service: Endoscopy;  Laterality: N/A;   ESOPHAGOGASTRODUODENOSCOPY (EGD) WITH PROPOFOL N/A 09/25/2020   Procedure: ESOPHAGOGASTRODUODENOSCOPY (EGD) WITH PROPOFOL;  Surgeon: Virgel Manifold, MD;  Location: [facility];  Service: Endoscopy;  Laterality: N/A;   ESOPHAGOGASTRODUODENOSCOPY (EGD) WITH PROPOFOL N/A 07/17/2021   Procedure: ESOPHAGOGASTRODUODENOSCOPY (EGD) WITH PROPOFOL;  Surgeon: Virgel Manifold, MD;  Location: [facility];  Service: Endoscopy;  Laterality: N/A;   INCISION AND DRAINAGE     x3 pilonidal cyst   PILONIDAL CYST EXCISION N/A 02/14/2020   Procedure: CYST EXCISION PILONIDAL EXTENSIVE;  Surgeon: Olean Ree, MD;  Location: [facility] OR[facility];  Service: General;  Laterality: N/A;    Prior to Admission medications   Medication Sig Start Date End Date Taking? Authorizing Provider  acetaminophen (TYLENOL) 500 MG tablet Take 500 mg by mouth  every 6 (six) hours as needed.   Yes [provider]  ARIPiprazole (ABILIFY) 2 MG tablet Take 2 mg by mouth daily. 06/17/21  Yes [provider]  busPIRone (BUSPAR) 15 MG tablet Take 15 mg by mouth. 05/12/21  Yes [provider]  Calcium Carbonate-Vit D-Min (CALCIUM 600+D PLUS MINERALS PO) Take by mouth.   Yes [provider]  Cholecalciferol (VITAMIN D) 50 MCG (2000 UT) CAPS Take by mouth.   Yes [provider]  desvenlafaxine (PRISTIQ) 50 MG 24 hr tablet Take 50 mg by mouth daily. 12/16/20  Yes [provider]  ferrous sulfate 325 (65 FE) MG tablet Take 325 mg by mouth daily with breakfast.   Yes [provider]  vitamin B-12 (CYANOCOBALAMIN) 500 MCG tablet Take 500 mcg by mouth daily.   Yes [provider]  buPROPion (WELLBUTRIN XL) 300 MG 24 hr tablet Take 300 mg by mouth every morning. 09/24/21   [provider]    Family History  Problem Relation Age of Onset   Leukemia Mother 19   Hypertension Father      Social History   Tobacco Use   Smoking status: Never   Smokeless tobacco: Never  Vaping Use   Vaping Use: Never used  Substance Use Topics   Alcohol use: Yes    Comment: seldom, less than one drink weekly   Drug use: Yes    Types: Marijuana    Comment: rarely    Allergies as of 10/02/2021 -  Review Complete 10/02/2021  Allergen Reaction Noted   Gluten meal  02/04/2021    Physical Examination:  Constitutional: General:   Alert,  Well-developed, well-nourished, pleasant and cooperative in NAD BP (!) 145/102   Pulse 92   Temp 98.9 F (37.2 C) (Oral)   Wt 296 lb (134.3 kg)   BMI 46.36 kg/m   Respiratory: Normal respiratory effort  Gastrointestinal:  Soft, non-tender and non-distended without masses, hepatosplenomegaly or hernias noted.  No guarding or rebound tenderness.     Cardiac: No clubbing or edema.  No cyanosis. Normal posterior tibial pedal pulses noted.  Psych:  Alert and  cooperative. Normal mood and affect.  Musculoskeletal:  Normal gait. Head normocephalic, atraumatic. Symmetrical without gross deformities. 5/5 Lower extremity strength bilaterally.  Skin: Warm. Intact without significant lesions or rashes. No jaundice.  Neck: Supple, trachea midline  Lymph: No cervical lymphadenopathy  Psych:  Alert and oriented x3, Alert and cooperative. Normal mood and affect.  Labs: CMP     Component Value Date/Time   NA 139 04/15/2020 1103   NA 142 03/06/2019 0834   K 4.0 04/15/2020 1103   CL 106 04/15/2020 1103   CO2 26 04/15/2020 1103   GLUCOSE 82 04/15/2020 1103   BUN 7 04/15/2020 1103   BUN 8 03/06/2019 0834   CREATININE 0.77 04/15/2020 1103   CALCIUM 9.4 04/15/2020 1103   PROT 7.1 04/15/2020 1103   PROT 7.5 03/06/2019 0834   ALBUMIN 4.2 04/15/2020 1103   ALBUMIN 4.1 03/06/2019 0834   AST 13 04/15/2020 1103   ALT 13 04/15/2020 1103   ALKPHOS 116 04/15/2020 1103   BILITOT 0.4 04/15/2020 1103   BILITOT <0.2 03/06/2019 0834   GFRNONAA >60 01/19/2020 1624   GFRAA >60 01/19/2020 1624   Lab Results  Component Value Date   WBC 5.1 06/12/2021   HGB 12.1 06/12/2021   HCT 37.6 06/12/2021   MCV 79.8 (L) 06/12/2021   PLT 404 (H) 06/12/2021    Imaging Studies:   Assessment and Plan:   [name] is a 30 y.o. y/o female here for follow-up of celiac disease  Patient doing very well with gluten-free diet and is compliant with it Compliance encouraged  DEXA scan completed Patient states she completed pneumonia vaccine at PCP  Hemoglobin improved.  Continue follow-up with hematology  Duodenal biopsies show improvement as well with gluten-free diet.  I will check TTG IgG.  Her previous serologies were weakly positive in September 2021, but repeat levels at this time will help with comparison  Gastric biopsies did not show intestinal metaplasia on gastric mapping biopsies.  However, there was 1 tissue that the pathologist reported was likely  contaminated from the duodenum as opposed to true intestinal metaplasia of the stomach.  On her future repeat EGDs in 2 to 3 years, can reassess for any metaplasia at that time as well  Continue vitamin D replacement  No other nutritional deficiencies present therefore, no indication for other repeat labs at this time  Follow-up in 1 year or earlier if new symptoms occur and patient agreeable with this plan    Dr [name]

## 2021-10-06 LAB — TISSUE TRANSGLUTAMINASE ABS,IGG,IGA
Tissue Transglut Ab: 4 U/mL (ref 0–5)
Transglutaminase IgA: 4 U/mL — ABNORMAL HIGH (ref 0–3)

## 2021-10-07 DIAGNOSIS — J029 Acute pharyngitis, unspecified: Secondary | ICD-10-CM | POA: Diagnosis not present

## 2021-10-07 DIAGNOSIS — Z03818 Encounter for observation for suspected exposure to other biological agents ruled out: Secondary | ICD-10-CM | POA: Diagnosis not present

## 2021-10-15 DIAGNOSIS — M5416 Radiculopathy, lumbar region: Secondary | ICD-10-CM | POA: Diagnosis not present

## 2021-10-15 DIAGNOSIS — M9902 Segmental and somatic dysfunction of thoracic region: Secondary | ICD-10-CM | POA: Diagnosis not present

## 2021-10-15 DIAGNOSIS — M9903 Segmental and somatic dysfunction of lumbar region: Secondary | ICD-10-CM | POA: Diagnosis not present

## 2021-10-15 DIAGNOSIS — M546 Pain in thoracic spine: Secondary | ICD-10-CM | POA: Diagnosis not present

## 2021-10-16 DIAGNOSIS — F331 Major depressive disorder, recurrent, moderate: Secondary | ICD-10-CM | POA: Diagnosis not present

## 2021-10-20 DIAGNOSIS — M9902 Segmental and somatic dysfunction of thoracic region: Secondary | ICD-10-CM | POA: Diagnosis not present

## 2021-10-20 DIAGNOSIS — M546 Pain in thoracic spine: Secondary | ICD-10-CM | POA: Diagnosis not present

## 2021-10-20 DIAGNOSIS — M5416 Radiculopathy, lumbar region: Secondary | ICD-10-CM | POA: Diagnosis not present

## 2021-10-20 DIAGNOSIS — M9903 Segmental and somatic dysfunction of lumbar region: Secondary | ICD-10-CM | POA: Diagnosis not present

## 2021-10-22 DIAGNOSIS — M546 Pain in thoracic spine: Secondary | ICD-10-CM | POA: Diagnosis not present

## 2021-10-22 DIAGNOSIS — M9903 Segmental and somatic dysfunction of lumbar region: Secondary | ICD-10-CM | POA: Diagnosis not present

## 2021-10-22 DIAGNOSIS — M9902 Segmental and somatic dysfunction of thoracic region: Secondary | ICD-10-CM | POA: Diagnosis not present

## 2021-10-22 DIAGNOSIS — M5416 Radiculopathy, lumbar region: Secondary | ICD-10-CM | POA: Diagnosis not present

## 2021-10-24 DIAGNOSIS — M9902 Segmental and somatic dysfunction of thoracic region: Secondary | ICD-10-CM | POA: Diagnosis not present

## 2021-10-24 DIAGNOSIS — M546 Pain in thoracic spine: Secondary | ICD-10-CM | POA: Diagnosis not present

## 2021-10-24 DIAGNOSIS — M5416 Radiculopathy, lumbar region: Secondary | ICD-10-CM | POA: Diagnosis not present

## 2021-10-24 DIAGNOSIS — M9903 Segmental and somatic dysfunction of lumbar region: Secondary | ICD-10-CM | POA: Diagnosis not present

## 2021-10-27 DIAGNOSIS — M546 Pain in thoracic spine: Secondary | ICD-10-CM | POA: Diagnosis not present

## 2021-10-27 DIAGNOSIS — M5416 Radiculopathy, lumbar region: Secondary | ICD-10-CM | POA: Diagnosis not present

## 2021-10-27 DIAGNOSIS — M9903 Segmental and somatic dysfunction of lumbar region: Secondary | ICD-10-CM | POA: Diagnosis not present

## 2021-10-27 DIAGNOSIS — M9902 Segmental and somatic dysfunction of thoracic region: Secondary | ICD-10-CM | POA: Diagnosis not present

## 2021-10-29 DIAGNOSIS — M9902 Segmental and somatic dysfunction of thoracic region: Secondary | ICD-10-CM | POA: Diagnosis not present

## 2021-10-29 DIAGNOSIS — M546 Pain in thoracic spine: Secondary | ICD-10-CM | POA: Diagnosis not present

## 2021-10-29 DIAGNOSIS — M9903 Segmental and somatic dysfunction of lumbar region: Secondary | ICD-10-CM | POA: Diagnosis not present

## 2021-10-29 DIAGNOSIS — M5416 Radiculopathy, lumbar region: Secondary | ICD-10-CM | POA: Diagnosis not present

## 2021-10-30 DIAGNOSIS — F321 Major depressive disorder, single episode, moderate: Secondary | ICD-10-CM | POA: Diagnosis not present

## 2021-10-30 DIAGNOSIS — F411 Generalized anxiety disorder: Secondary | ICD-10-CM | POA: Diagnosis not present

## 2021-10-30 DIAGNOSIS — F902 Attention-deficit hyperactivity disorder, combined type: Secondary | ICD-10-CM | POA: Diagnosis not present

## 2021-11-03 DIAGNOSIS — M5416 Radiculopathy, lumbar region: Secondary | ICD-10-CM | POA: Diagnosis not present

## 2021-11-03 DIAGNOSIS — M9903 Segmental and somatic dysfunction of lumbar region: Secondary | ICD-10-CM | POA: Diagnosis not present

## 2021-11-03 DIAGNOSIS — M546 Pain in thoracic spine: Secondary | ICD-10-CM | POA: Diagnosis not present

## 2021-11-03 DIAGNOSIS — M9902 Segmental and somatic dysfunction of thoracic region: Secondary | ICD-10-CM | POA: Diagnosis not present

## 2021-11-06 DIAGNOSIS — M5416 Radiculopathy, lumbar region: Secondary | ICD-10-CM | POA: Diagnosis not present

## 2021-11-06 DIAGNOSIS — M546 Pain in thoracic spine: Secondary | ICD-10-CM | POA: Diagnosis not present

## 2021-11-06 DIAGNOSIS — M9902 Segmental and somatic dysfunction of thoracic region: Secondary | ICD-10-CM | POA: Diagnosis not present

## 2021-11-06 DIAGNOSIS — M9903 Segmental and somatic dysfunction of lumbar region: Secondary | ICD-10-CM | POA: Diagnosis not present

## 2021-11-10 DIAGNOSIS — M5416 Radiculopathy, lumbar region: Secondary | ICD-10-CM | POA: Diagnosis not present

## 2021-11-10 DIAGNOSIS — M546 Pain in thoracic spine: Secondary | ICD-10-CM | POA: Diagnosis not present

## 2021-11-10 DIAGNOSIS — M9902 Segmental and somatic dysfunction of thoracic region: Secondary | ICD-10-CM | POA: Diagnosis not present

## 2021-11-10 DIAGNOSIS — M9903 Segmental and somatic dysfunction of lumbar region: Secondary | ICD-10-CM | POA: Diagnosis not present

## 2021-11-13 DIAGNOSIS — M9902 Segmental and somatic dysfunction of thoracic region: Secondary | ICD-10-CM | POA: Diagnosis not present

## 2021-11-13 DIAGNOSIS — M5416 Radiculopathy, lumbar region: Secondary | ICD-10-CM | POA: Diagnosis not present

## 2021-11-13 DIAGNOSIS — M9903 Segmental and somatic dysfunction of lumbar region: Secondary | ICD-10-CM | POA: Diagnosis not present

## 2021-11-13 DIAGNOSIS — M546 Pain in thoracic spine: Secondary | ICD-10-CM | POA: Diagnosis not present

## 2021-11-17 DIAGNOSIS — M9903 Segmental and somatic dysfunction of lumbar region: Secondary | ICD-10-CM | POA: Diagnosis not present

## 2021-11-17 DIAGNOSIS — M546 Pain in thoracic spine: Secondary | ICD-10-CM | POA: Diagnosis not present

## 2021-11-17 DIAGNOSIS — M5416 Radiculopathy, lumbar region: Secondary | ICD-10-CM | POA: Diagnosis not present

## 2021-11-17 DIAGNOSIS — M9902 Segmental and somatic dysfunction of thoracic region: Secondary | ICD-10-CM | POA: Diagnosis not present

## 2021-11-19 DIAGNOSIS — M5416 Radiculopathy, lumbar region: Secondary | ICD-10-CM | POA: Diagnosis not present

## 2021-11-19 DIAGNOSIS — M546 Pain in thoracic spine: Secondary | ICD-10-CM | POA: Diagnosis not present

## 2021-11-19 DIAGNOSIS — M9903 Segmental and somatic dysfunction of lumbar region: Secondary | ICD-10-CM | POA: Diagnosis not present

## 2021-11-19 DIAGNOSIS — M9902 Segmental and somatic dysfunction of thoracic region: Secondary | ICD-10-CM | POA: Diagnosis not present

## 2021-11-24 DIAGNOSIS — M9902 Segmental and somatic dysfunction of thoracic region: Secondary | ICD-10-CM | POA: Diagnosis not present

## 2021-11-24 DIAGNOSIS — M5416 Radiculopathy, lumbar region: Secondary | ICD-10-CM | POA: Diagnosis not present

## 2021-11-24 DIAGNOSIS — M9903 Segmental and somatic dysfunction of lumbar region: Secondary | ICD-10-CM | POA: Diagnosis not present

## 2021-11-24 DIAGNOSIS — M546 Pain in thoracic spine: Secondary | ICD-10-CM | POA: Diagnosis not present

## 2021-11-27 DIAGNOSIS — M5416 Radiculopathy, lumbar region: Secondary | ICD-10-CM | POA: Diagnosis not present

## 2021-11-27 DIAGNOSIS — M546 Pain in thoracic spine: Secondary | ICD-10-CM | POA: Diagnosis not present

## 2021-11-27 DIAGNOSIS — M9903 Segmental and somatic dysfunction of lumbar region: Secondary | ICD-10-CM | POA: Diagnosis not present

## 2021-11-27 DIAGNOSIS — M9902 Segmental and somatic dysfunction of thoracic region: Secondary | ICD-10-CM | POA: Diagnosis not present

## 2021-12-09 DIAGNOSIS — F331 Major depressive disorder, recurrent, moderate: Secondary | ICD-10-CM | POA: Diagnosis not present

## 2021-12-19 DIAGNOSIS — J069 Acute upper respiratory infection, unspecified: Secondary | ICD-10-CM | POA: Diagnosis not present

## 2021-12-23 DIAGNOSIS — F331 Major depressive disorder, recurrent, moderate: Secondary | ICD-10-CM | POA: Diagnosis not present

## 2021-12-25 DIAGNOSIS — F902 Attention-deficit hyperactivity disorder, combined type: Secondary | ICD-10-CM | POA: Diagnosis not present

## 2021-12-25 DIAGNOSIS — F411 Generalized anxiety disorder: Secondary | ICD-10-CM | POA: Diagnosis not present

## 2021-12-25 DIAGNOSIS — F321 Major depressive disorder, single episode, moderate: Secondary | ICD-10-CM | POA: Diagnosis not present

## 2022-01-06 DIAGNOSIS — F331 Major depressive disorder, recurrent, moderate: Secondary | ICD-10-CM | POA: Diagnosis not present

## 2022-02-03 DIAGNOSIS — F331 Major depressive disorder, recurrent, moderate: Secondary | ICD-10-CM | POA: Diagnosis not present

## 2022-02-10 ENCOUNTER — Other Ambulatory Visit: Payer: Self-pay

## 2022-02-10 ENCOUNTER — Encounter: Payer: Self-pay | Admitting: Adult Health

## 2022-02-10 ENCOUNTER — Ambulatory Visit: Payer: BC Managed Care – PPO | Admitting: Adult Health

## 2022-02-10 VITALS — BP 136/72 | HR 95 | Temp 97.8°F | Ht 67.0 in | Wt 303.0 lb

## 2022-02-10 DIAGNOSIS — G4733 Obstructive sleep apnea (adult) (pediatric): Secondary | ICD-10-CM | POA: Diagnosis not present

## 2022-02-10 NOTE — Assessment & Plan Note (Signed)
Healthy weight loss discussed 

## 2022-02-10 NOTE — Progress Notes (Signed)
@Patient  ID: [name], female    DOB: 1991-05-18, 31 y.o.   MRN: [id number]  Chief Complaint  Patient presents with   Follow-up    Referring provider: Reola Mosher, MD  HPI: 31 year old female seen for sleep consult April 09, 2020 for daytime sleepiness and snoring Found to have moderate obstructive sleep apnea with significant nocturnal hypoxemia  TEST/EVENTS :  Home sleep study from 07/22/20 showed moderate obstructive sleep apnea with an AHI of 18.5 and SpO2 low of 61%.     02/10/2022 Follow up : OSA  Patient returns for a follow-up visit.  Patient was seen for sleep consult April 09, 2020 for daytime sleepiness and snoring.  She was set up for home sleep study completed July 22, 2020 that showed moderate obstructive sleep apnea the AHI at 18.5/hour and SPO2 low at 61%.  Patient was started on CPAP.  Unfortunately patient says she was unable to tolerate it and turned her CPAP back into her DME company.  Patient complains that she continues to have ongoing sleepiness snoring and restless sleep and increased symptom burden that is affecting her quality of life.  She would like to retry her CPAP.   Allergies  Allergen Reactions   Gluten Meal     Diarrhea, stomach pain    Immunization History  Administered Date(s) Administered   HPV Quadrivalent 02/16/2006, 10/25/2006, 02/25/2007   Hpv-Unspecified 02/16/2006, 10/25/2006, 02/25/2007   Influenza Whole 10/21/2009   Influenza,inj,Quad PF,6+ Mos 10/20/2016, 10/26/2017, 02/06/2021   Janssen (J&J) SARS-COV-2 Vaccination 03/05/2020   Meningococcal Polysaccharide 11/15/2009   Pneumococcal Polysaccharide-23 02/06/2021   Td 10/21/2009   Tdap 11/21/2019    Past Medical History:  Diagnosis Date   Anemia    Anxiety    Celiac disease    Depression    History of MRSA infection    Migraine    Pilonidal cyst    Polycystic ovaries     Tobacco History: Social History   Tobacco Use  Smoking Status Never  Smokeless Tobacco  Never   Counseling given: Not Answered   Outpatient Medications Prior to Visit  Medication Sig Dispense Refill   acetaminophen (TYLENOL) 500 MG tablet Take 500 mg by mouth every 6 (six) hours as needed.     ARIPiprazole (ABILIFY) 2 MG tablet Take 2 mg by mouth daily.     buPROPion (WELLBUTRIN XL) 300 MG 24 hr tablet Take 300 mg by mouth every morning.     desvenlafaxine (PRISTIQ) 50 MG 24 hr tablet Take 50 mg by mouth daily.     ferrous sulfate 325 (65 FE) MG tablet Take 325 mg by mouth daily with breakfast.     busPIRone (BUSPAR) 15 MG tablet Take 15 mg by mouth.     Calcium Carbonate-Vit D-Min (CALCIUM 600+D PLUS MINERALS PO) Take by mouth.     Cholecalciferol (VITAMIN D) 50 MCG (2000 UT) CAPS Take by mouth.     vitamin B-12 (CYANOCOBALAMIN) 500 MCG tablet Take 500 mcg by mouth daily.     No facility-administered medications prior to visit.     Review of Systems:   Constitutional:   No  weight loss, night sweats,  Fevers, chills,  +fatigue, or  lassitude.  HEENT:   No headaches,  Difficulty swallowing,  Tooth/dental problems, or  Sore throat,                No sneezing, itching, ear ache, nasal congestion, post nasal drip,   CV:  No chest pain,  Orthopnea, PND, swelling in lower extremities, anasarca, dizziness, palpitations, syncope.   GI  No heartburn, indigestion, abdominal pain, nausea, vomiting, diarrhea, change in bowel habits, loss of appetite, bloody stools.   Resp: No shortness of breath with exertion or at rest.  No excess mucus, no productive cough,  No non-productive cough,  No coughing up of blood.  No change in color of mucus.  No wheezing.  No chest wall deformity  Skin: no rash or lesions.  GU: no dysuria, change in color of urine, no urgency or frequency.  No flank pain, no hematuria   MS:  No joint pain or swelling.  No decreased range of motion.  No back pain.    Physical Exam  BP 136/72 (BP Location: Left Arm, Cuff Size: Large)    Pulse 95    Temp  97.8 F (36.6 C) (Temporal)    Ht 5\' 7"  (1.702 m)    Wt (!) 303 lb (137.4 kg)    SpO2 98%    BMI 47.46 kg/m   GEN: A/Ox3; pleasant , NAD, well nourished    HEENT:  [place]/AT,  NOSE-clear, THROAT-clear, no lesions, no postnasal drip or exudate noted. Class 2-3 MP airway , tonsils 1+   NECK:  Supple w/ fair ROM; no JVD; normal carotid impulses w/o bruits; no thyromegaly or nodules palpated; no lymphadenopathy.    RESP  Clear  P & A; w/o, wheezes/ rales/ or rhonchi. no accessory muscle use, no dullness to percussion  CARD:  RRR, no m/r/g, no peripheral edema, pulses intact, no cyanosis or clubbing.  GI:   Soft & nt; nml bowel sounds; no organomegaly or masses detected.   Musco: Warm bil, no deformities or joint swelling noted.   Neuro: alert, no focal deficits noted.    Skin: Warm, no lesions or rashes    Lab Results:    BNP No results found for: BNP  ProBNP No results found for: PROBNP  Imaging: No results found.    No flowsheet data found.  No results found for: NITRICOXIDE      Assessment & Plan:   OSA (obstructive sleep apnea) Moderate obstructive sleep apnea with significant nocturnal hypoxemia.  Patient unfortunately was unable to tolerate CPAP initially.  Since she had a break in therapy require a repeat sleep study.  We will set up for an in lab split-night sleep study  Patient education on sleep apnea - discussed how weight can impact sleep and risk for sleep disordered breathing - discussed options to assist with weight loss: combination of diet modification, cardiovascular and strength training exercises   - had an extensive discussion regarding the adverse health consequences related to untreated sleep disordered breathing - specifically discussed the risks for hypertension, coronary artery disease, cardiac dysrhythmias, cerebrovascular disease, and diabetes - lifestyle modification discussed   - discussed how sleep disruption can increase risk of  accidents, particularly when driving - safe driving practices were discussed    Plan  Patient Instructions  Set up for split night sleep study .  Work on healthy weight .  Do not drive if sleepy .  Follow up in 3 months and As needed       Morbid obesity ([place]) Healthy weight loss discussed     Rexene Edison, NP 02/10/2022

## 2022-02-10 NOTE — Assessment & Plan Note (Signed)
Moderate obstructive sleep apnea with significant nocturnal hypoxemia.  Patient unfortunately was unable to tolerate CPAP initially.  Since she had a break in therapy require a repeat sleep study.  We will set up for an in lab split-night sleep study  Patient education on sleep apnea - discussed how weight can impact sleep and risk for sleep disordered breathing - discussed options to assist with weight loss: combination of diet modification, cardiovascular and strength training exercises   - had an extensive discussion regarding the adverse health consequences related to untreated sleep disordered breathing - specifically discussed the risks for hypertension, coronary artery disease, cardiac dysrhythmias, cerebrovascular disease, and diabetes - lifestyle modification discussed   - discussed how sleep disruption can increase risk of accidents, particularly when driving - safe driving practices were discussed    Plan  Patient Instructions  Set up for split night sleep study .  Work on healthy weight .  Do not drive if sleepy .  Follow up in 3 months and As needed

## 2022-02-10 NOTE — Patient Instructions (Signed)
Set up for split night sleep study .  Work on healthy weight .  Do not drive if sleepy .  Follow up in 3 months and As needed

## 2022-02-17 DIAGNOSIS — F331 Major depressive disorder, recurrent, moderate: Secondary | ICD-10-CM | POA: Diagnosis not present

## 2022-03-02 DIAGNOSIS — F331 Major depressive disorder, recurrent, moderate: Secondary | ICD-10-CM | POA: Diagnosis not present

## 2022-03-16 DIAGNOSIS — F331 Major depressive disorder, recurrent, moderate: Secondary | ICD-10-CM | POA: Diagnosis not present

## 2022-03-23 DIAGNOSIS — F902 Attention-deficit hyperactivity disorder, combined type: Secondary | ICD-10-CM | POA: Diagnosis not present

## 2022-03-23 DIAGNOSIS — F321 Major depressive disorder, single episode, moderate: Secondary | ICD-10-CM | POA: Diagnosis not present

## 2022-03-23 DIAGNOSIS — F411 Generalized anxiety disorder: Secondary | ICD-10-CM | POA: Diagnosis not present

## 2022-03-30 DIAGNOSIS — F331 Major depressive disorder, recurrent, moderate: Secondary | ICD-10-CM | POA: Diagnosis not present

## 2022-04-13 DIAGNOSIS — F331 Major depressive disorder, recurrent, moderate: Secondary | ICD-10-CM | POA: Diagnosis not present

## 2022-04-17 ENCOUNTER — Telehealth: Payer: Self-pay | Admitting: Adult Health

## 2022-04-17 NOTE — Telephone Encounter (Signed)
Split night was ordered 02/10/22. Patient stated that she was not contacted by [facility] to schedule study.  ?04/21/2022 appt has been rescheduled to 05/19/2022 per patient request.  ? ?Rodena Piety, can you check on sleep study? Thanks ?

## 2022-04-21 ENCOUNTER — Ambulatory Visit: Payer: BC Managed Care – PPO | Admitting: Adult Health

## 2022-04-23 NOTE — Telephone Encounter (Signed)
[name], is there any update on this?  ?

## 2022-04-23 NOTE — Telephone Encounter (Signed)
When I spoke with [name] at [facility] she stated that she doesn't have the new sleep study order that was faxed on 02/12/22. I have refaxed the order attention Ann per her instructions and she will call the patient  ?

## 2022-04-27 DIAGNOSIS — F331 Major depressive disorder, recurrent, moderate: Secondary | ICD-10-CM | POA: Diagnosis not present

## 2022-04-29 NOTE — Telephone Encounter (Signed)
Spoke to patient. She stated that she is scheduled for sleep study 05/27/2022. ?Nothing further needed.  ? ?

## 2022-05-11 DIAGNOSIS — F331 Major depressive disorder, recurrent, moderate: Secondary | ICD-10-CM | POA: Diagnosis not present

## 2022-05-19 ENCOUNTER — Ambulatory Visit: Payer: BC Managed Care – PPO | Admitting: Adult Health

## 2022-05-20 DIAGNOSIS — Z0000 Encounter for general adult medical examination without abnormal findings: Secondary | ICD-10-CM | POA: Diagnosis not present

## 2022-05-20 DIAGNOSIS — K900 Celiac disease: Secondary | ICD-10-CM | POA: Diagnosis not present

## 2022-05-20 DIAGNOSIS — Z23 Encounter for immunization: Secondary | ICD-10-CM | POA: Diagnosis not present

## 2022-05-20 DIAGNOSIS — E559 Vitamin D deficiency, unspecified: Secondary | ICD-10-CM | POA: Diagnosis not present

## 2022-05-20 DIAGNOSIS — G4733 Obstructive sleep apnea (adult) (pediatric): Secondary | ICD-10-CM | POA: Diagnosis not present

## 2022-05-20 DIAGNOSIS — Z124 Encounter for screening for malignant neoplasm of cervix: Secondary | ICD-10-CM | POA: Diagnosis not present

## 2022-05-20 DIAGNOSIS — F321 Major depressive disorder, single episode, moderate: Secondary | ICD-10-CM | POA: Diagnosis not present

## 2022-05-20 DIAGNOSIS — R7303 Prediabetes: Secondary | ICD-10-CM | POA: Diagnosis not present

## 2022-05-20 DIAGNOSIS — Z113 Encounter for screening for infections with a predominantly sexual mode of transmission: Secondary | ICD-10-CM | POA: Diagnosis not present

## 2022-05-27 ENCOUNTER — Encounter: Payer: Self-pay | Admitting: Adult Health

## 2022-05-27 ENCOUNTER — Ambulatory Visit: Payer: BC Managed Care – PPO | Attending: Pulmonary Disease

## 2022-05-27 DIAGNOSIS — R400 Somnolence: Secondary | ICD-10-CM | POA: Diagnosis not present

## 2022-05-27 DIAGNOSIS — Z6841 Body Mass Index (BMI) 40.0 and over, adult: Secondary | ICD-10-CM | POA: Diagnosis not present

## 2022-05-27 DIAGNOSIS — R0683 Snoring: Secondary | ICD-10-CM | POA: Insufficient documentation

## 2022-05-27 DIAGNOSIS — G4733 Obstructive sleep apnea (adult) (pediatric): Secondary | ICD-10-CM | POA: Insufficient documentation

## 2022-05-29 ENCOUNTER — Telehealth (INDEPENDENT_AMBULATORY_CARE_PROVIDER_SITE_OTHER): Payer: Self-pay | Admitting: Pulmonary Disease

## 2022-05-29 DIAGNOSIS — G4733 Obstructive sleep apnea (adult) (pediatric): Secondary | ICD-10-CM

## 2022-05-29 NOTE — Telephone Encounter (Signed)
Please set up follow up visit to discuss sleep study results and treatment plan

## 2022-05-29 NOTE — Telephone Encounter (Signed)
NPSG showed mod  OSA with RDI 16/ hr Suggest autoCPAP  5-15 cm, mask of choice Given prior issues with CPAP tolerance, consider formal titration

## 2022-06-02 NOTE — Telephone Encounter (Signed)
Unable to locate sleep study so patient can be scheduled for ov/video visit.  We need the sleep study please.  Do you all happen to have the sleep study?  Thank you.

## 2022-06-03 NOTE — Telephone Encounter (Signed)
I have left a message for [facility] to fax [place] a copy of the sleep study

## 2022-06-10 NOTE — Telephone Encounter (Signed)
Sleep study was obtained.  Please set up office visit to discuss sleep study results and treatment plan

## 2022-06-12 NOTE — Telephone Encounter (Signed)
Sleep study results printed and placed in TP's cabinet.  Called and spoke with patient, she stated she already had a visit set up for 6/19.  She is setup for a mychart visit on 6/19 at 3 pm, advised to arrive by 2:45 pm.  She verbalized understanding.  Nothing further needed.

## 2022-06-15 ENCOUNTER — Telehealth (INDEPENDENT_AMBULATORY_CARE_PROVIDER_SITE_OTHER): Payer: BC Managed Care – PPO | Admitting: Adult Health

## 2022-06-15 ENCOUNTER — Encounter: Payer: Self-pay | Admitting: Adult Health

## 2022-06-15 DIAGNOSIS — G4733 Obstructive sleep apnea (adult) (pediatric): Secondary | ICD-10-CM

## 2022-06-15 NOTE — Progress Notes (Signed)
Virtual Visit via Video Note  I connected with [name] on 06/15/22 at  3:00 PM EDT by a video enabled telemedicine application and verified that I am speaking with the correct person using two identifiers.  Location: Patient: Home  Provider: Office    I discussed the limitations of evaluation and management by telemedicine and the availability of in person appointments. The patient expressed understanding and agreed to proceed.  History of Present Illness: 31 year old female seen for sleep consult April 09, 2020 for daytime sleepiness and snoring found to have moderate sleep apnea, reestablished February 10, 2022 with repeat sleep study showing moderate sleep apnea  Today's video visit is to review recent sleep study.  Patient was seen last visit for a sleep consult to reestablish for sleep apnea.  Initially seen April 09, 2020 for daytime sleepiness and snoring.  Was set up for home sleep study that showed moderate sleep apnea with AHI 18.5/hour and SPO2 low at 61%.  Patient had briefly started CPAP but said she did not tolerate it well.  And sent the machine back.  Patient was set up for a repeat sleep study completed on June 09, 2022 this showed moderate sleep apnea with RDI at 16/hour.  We discussed her sleep study results in detail and went over treatment options including weight loss, oral appliance and CPAP.  Patient would like to restart CPAP.  Patient education was given on CPAP.  Past Medical History:  Diagnosis Date   Anemia    Anxiety    Celiac disease    Depression    History of MRSA infection    Migraine    Pilonidal cyst    Polycystic ovaries    Current Outpatient Medications on File Prior to Visit  Medication Sig Dispense Refill   metFORMIN (GLUCOPHAGE-XR) 500 MG 24 hr tablet Take by mouth.     acetaminophen (TYLENOL) 500 MG tablet Take 500 mg by mouth every 6 (six) hours as needed.     ARIPiprazole (ABILIFY) 2 MG tablet Take 2 mg by mouth daily.     buPROPion  (WELLBUTRIN XL) 300 MG 24 hr tablet Take 300 mg by mouth every morning.     desvenlafaxine (PRISTIQ) 50 MG 24 hr tablet Take 50 mg by mouth daily.     ferrous sulfate 325 (65 FE) MG tablet Take 325 mg by mouth daily with breakfast.     No current facility-administered medications on file prior to visit.       Observations/Objective: Appears in nad    Home sleep study from 07/22/20 showed moderate obstructive sleep apnea with an AHI of 18.5 and SpO2 low of 61%.  06/09/22 NPSG showed mod  OSA with RDI 16/ hr  Assessment and Plan: Moderate obstructive sleep apnea-begin CPAP.  We will begin at [facility] 5 to 15 cm H2O.  Mask of choice.  Enroll in [place].   Morbid obesity-healthy weight loss  Plan  Patient Instructions  Begin CPAP at bedtime Goal is to wear your CPAP all night long for at least 6 or more hours Work on healthy weight.  Do not drive if sleepy .  Follow up in 2-3 months and As needed      Follow Up Instructions:    I discussed the assessment and treatment plan with the patient. The patient was provided an opportunity to ask questions and all were answered. The patient agreed with the plan and demonstrated an understanding of the instructions.   The patient was advised to  call back or seek an in-person evaluation if the symptoms worsen or if the condition fails to improve as anticipated.  I provided 22 minutes of non-face-to-face time during this encounter.   Rexene Edison, NP

## 2022-06-15 NOTE — Progress Notes (Signed)
Reviewed and agree with assessment/plan.   Chesley Mires, MD [facility] Pulmonary/Critical Care 06/15/2022, 4:05 PM Pager:  [phone]

## 2022-06-15 NOTE — Addendum Note (Signed)
Addended by: Vanessa Barbara on: 06/15/2022 04:01 PM   Modules accepted: Orders

## 2022-06-15 NOTE — Patient Instructions (Signed)
Begin CPAP at bedtime Goal is to wear your CPAP all night long for at least 6 or more hours Work on healthy weight.  Do not drive if sleepy .  Follow up in 2-3 months and As needed

## 2023-01-20 ENCOUNTER — Ambulatory Visit
Admission: EM | Admit: 2023-01-20 | Discharge: 2023-01-20 | Disposition: A | Discharge status: Home / Self Care | Payer: BC Managed Care – PPO

## 2023-01-20 DIAGNOSIS — J019 Acute sinusitis, unspecified: Secondary | ICD-10-CM

## 2023-01-20 DIAGNOSIS — B9789 Other viral agents as the cause of diseases classified elsewhere: Secondary | ICD-10-CM | POA: Diagnosis not present

## 2023-01-20 NOTE — ED Triage Notes (Signed)
Pt. Presents to UC w/ c/o nasal congestion, sinus pressure, a cough and post-nasal drip that started two days ago.

## 2023-01-20 NOTE — ED Provider Notes (Signed)
[name]    CSN: [id number] Arrival date & time: 01/20/23  1641      History   Chief Complaint Chief Complaint  Patient presents with   Nasal Congestion    Might be sinus infection - Entered by patient   Facial Pain   Cough    HPI [name] is a 32 y.o. female.    Cough   Patient presents to urgent care with complaint of nasal congestion, sinus pressure, cough, postnasal drip.  Symptoms started 2 days ago.  Denies fever, chills, body aches, nausea, vomiting, diarrhea.   Past Medical History:  Diagnosis Date   Anemia    Anxiety    Celiac disease    Depression    History of MRSA infection    Migraine    Pilonidal cyst    Polycystic ovaries     Patient Active Problem List   Diagnosis Date Noted   OSA (obstructive sleep apnea) 02/10/2022   Celiac disease    Gastric intestinal metaplasia    Diarrhea    Stomach irritation    Lipoma of colon    Daytime sleepiness 04/09/2020   Difficulty concentrating 02/29/2020   Elevated BP without diagnosis of hypertension 02/29/2020   Snoring 02/29/2020   Fever 02/10/2019   Pilonidal cyst 09/13/2018   IBS (irritable bowel syndrome) 01/28/2018   Laryngitis 12/31/2017   First degree ankle sprain, left, subsequent encounter 10/26/2017   Depression, major, single episode, moderate ([place]) 07/23/2017   Chronic upper back pain 06/11/2016   Morbid obesity ([place]) 06/26/2014   Encounter for counseling regarding contraception 06/16/2013   NONSPEC REACT TUBERCULIN SKN TEST W/O ACTV TB 10/21/2009   POLYCYSTIC OVARIAN DISEASE 04/15/2009   ALLERGIC RHINITIS 04/15/2009   MIGRAINES, HX OF 04/15/2009    Past Surgical History:  Procedure Laterality Date   COLONOSCOPY WITH PROPOFOL N/A 09/25/2020   Procedure: COLONOSCOPY WITH PROPOFOL;  Surgeon: Virgel Manifold, MD;  Location: [facility];  Service: Endoscopy;  Laterality: N/A;   ESOPHAGOGASTRODUODENOSCOPY (EGD) WITH PROPOFOL N/A 09/25/2020   Procedure:  ESOPHAGOGASTRODUODENOSCOPY (EGD) WITH PROPOFOL;  Surgeon: Virgel Manifold, MD;  Location: [facility];  Service: Endoscopy;  Laterality: N/A;   ESOPHAGOGASTRODUODENOSCOPY (EGD) WITH PROPOFOL N/A 07/17/2021   Procedure: ESOPHAGOGASTRODUODENOSCOPY (EGD) WITH PROPOFOL;  Surgeon: Virgel Manifold, MD;  Location: [facility];  Service: Endoscopy;  Laterality: N/A;   INCISION AND DRAINAGE     x3 pilonidal cyst   PILONIDAL CYST EXCISION N/A 02/14/2020   Procedure: CYST EXCISION PILONIDAL EXTENSIVE;  Surgeon: Olean Ree, MD;  Location: [facility] OR[facility];  Service: General;  Laterality: N/A;    OB History     Gravida  0   Para  0   Term  0   Preterm  0   AB  0   Living  0      SAB  0   IAB  0   Ectopic  0   Multiple  0   Live Births  0        Obstetric Comments  1st Menstrual Cycle:  13            Home Medications    Prior to Admission medications   Medication Sig Start Date End Date Taking? Authorizing Provider  acetaminophen (TYLENOL) 500 MG tablet Take 500 mg by mouth every 6 (six) hours as needed.    [provider]  ARIPiprazole (ABILIFY) 2 MG tablet Take 2 mg by mouth daily. 06/17/21   [provider]  buPROPion (WELLBUTRIN XL) 300 MG 24 hr tablet Take 300 mg by mouth every morning. 09/24/21   [provider]  desvenlafaxine (PRISTIQ) 50 MG 24 hr tablet Take 50 mg by mouth daily. 12/16/20   [provider]  ferrous sulfate 325 (65 FE) MG tablet Take 325 mg by mouth daily with breakfast.    [provider]    Family History Family History  Problem Relation Age of Onset   Leukemia Mother 7   Hypertension Father     Social History Social History   Tobacco Use   Smoking status: Never   Smokeless tobacco: Never  Vaping Use   Vaping Use: Never used  Substance Use Topics   Alcohol use: Yes    Comment: seldom, less than one drink weekly   Drug use: Yes    Types: Marijuana    Comment: rarely      Allergies   Gluten meal   Review of Systems Review of Systems  Respiratory:  Positive for cough.      Physical Exam Triage Vital Signs ED Triage Vitals  Enc Vitals Group     BP 01/20/23 1704 (!) 141/89     Pulse Rate 01/20/23 1704 (!) 106     Resp 01/20/23 1704 18     Temp 01/20/23 1704 99.3 F (37.4 C)     Temp Source 01/20/23 1704 Oral     SpO2 01/20/23 1704 95 %     Weight --      Height --      Head Circumference --      Peak Flow --      Pain Score 01/20/23 1708 0     Pain Loc --      Pain Edu? --      Excl. in [place]? --    No data found.  Updated Vital Signs BP (!) 141/89 (BP Location: Right Wrist)   Pulse (!) 106   Temp 99.3 F (37.4 C) (Oral)   Resp 18   LMP  (LMP Unknown)   SpO2 95%   Visual Acuity Right Eye Distance:   Left Eye Distance:   Bilateral Distance:    Right Eye Near:   Left Eye Near:    Bilateral Near:     Physical Exam Vitals reviewed.  Constitutional:      Appearance: Normal appearance. She is ill-appearing.  Skin:    General: Skin is warm and dry.  Neurological:     General: No focal deficit present.     Mental Status: She is alert and oriented to person, place, and time.  Psychiatric:        Mood and Affect: Mood normal.        Behavior: Behavior normal.      UC Treatments / Results  Labs (all labs ordered are listed, but only abnormal results are displayed) Labs Reviewed - No data to display  EKG   Radiology No results found.  Procedures Procedures (including critical care time)  Medications Ordered in UC Medications - No data to display  Initial Impression / Assessment and Plan / UC Course  I have reviewed the triage vital signs and the nursing notes.  Pertinent labs & imaging results that were available during my care of the patient were reviewed by me and considered in my medical decision making (see chart for details).   Patient is afebrile here without recent antipyretics. Satting well on room  air. Overall is well appearing, well hydrated, without respiratory distress.  Sinus tenderness is present.  Symptoms are consistent with an acute viral process.  Recommending use of OTC nasal decongestant such as Sudafed, Flonase.  Patient will continue to use guaifenesin as well to thin mucus secretions.  Final Clinical Impressions(s) / UC Diagnoses   Final diagnoses:  None   Discharge Instructions   None    ED Prescriptions   None    PDMP not reviewed this encounter.   Rose Phi, [place] 01/20/23 1730

## 2023-01-20 NOTE — Discharge Instructions (Signed)
Follow up here or with your primary care provider if your symptoms are worsening or not improving.
